# Patient Record
Sex: Female | Born: 1970
Health system: Southern US, Community
[De-identification: ages and names within clinical notes are randomized; demographics above are authoritative.]

## PROBLEM LIST (undated history)

## (undated) DIAGNOSIS — E119 Type 2 diabetes mellitus without complications: Secondary | ICD-10-CM

## (undated) DIAGNOSIS — E079 Disorder of thyroid, unspecified: Secondary | ICD-10-CM

## (undated) DIAGNOSIS — N920 Excessive and frequent menstruation with regular cycle: Secondary | ICD-10-CM

## (undated) HISTORY — DX: Excessive and frequent menstruation with regular cycle: N92.0

## (undated) HISTORY — PX: WISDOM TOOTH EXTRACTION: SHX21

## (undated) HISTORY — DX: Disorder of thyroid, unspecified: E07.9

---

## 2011-11-24 ENCOUNTER — Other Ambulatory Visit: Payer: Self-pay | Admitting: Family Medicine

## 2011-11-24 DIAGNOSIS — N644 Mastodynia: Secondary | ICD-10-CM

## 2011-12-01 ENCOUNTER — Ambulatory Visit
Admission: RE | Admit: 2011-12-01 | Discharge: 2011-12-01 | Disposition: A | Discharge status: Home / Self Care | Payer: Self-pay | Source: Ambulatory Visit | Attending: Family Medicine | Admitting: Family Medicine

## 2011-12-01 ENCOUNTER — Other Ambulatory Visit: Payer: Self-pay | Admitting: Family Medicine

## 2011-12-01 DIAGNOSIS — N644 Mastodynia: Secondary | ICD-10-CM

## 2012-12-19 ENCOUNTER — Emergency Department (HOSPITAL_COMMUNITY)
Admission: EM | Admit: 2012-12-19 | Discharge: 2012-12-19 | Disposition: A | Discharge status: Home / Self Care | Payer: No Typology Code available for payment source | Source: Home / Self Care

## 2012-12-19 ENCOUNTER — Encounter (HOSPITAL_COMMUNITY): Payer: Self-pay

## 2012-12-19 DIAGNOSIS — N10 Acute tubulo-interstitial nephritis: Secondary | ICD-10-CM

## 2012-12-19 MED ORDER — CIPROFLOXACIN HCL 500 MG PO TABS: 500.0000 mg | ORAL_TABLET | Freq: Two times a day (BID) | ORAL | Status: DC

## 2012-12-19 MED ORDER — TRAMADOL HCL 50 MG PO TABS: 50.0000 mg | ORAL_TABLET | Freq: Four times a day (QID) | ORAL | Status: DC | PRN

## 2012-12-19 NOTE — ED Notes (Signed)
Patient complains of lower right back pain past two days Has burning when she voids

## 2012-12-19 NOTE — ED Provider Notes (Signed)
History     CSN: [id number]  Arrival date & time 12/19/12  1716   First MD Initiated Contact with Patient 12/19/12 1730      Right flank pain  (Consider location/radiation/quality/duration/timing/severity/associated sxs/prior treatment) HPI Patient is 42 year old female who presents to clinic with main concern of today history of lower abdominal quadrant pain associated with right flank pain, associated with fevers and chills and poor oral intake. Patient reports similar event in the past several years back that was due to kidney infection. Patient describes pain is colicky, intermittent in nature, nonradiating, 7/10 in severity when present. Patient explains she does not have other abdominal concerns, no nausea or vomiting, no changes in weight. Patient denies chest pain or shortness of breath, no other systemic concerns. She has not tried any over-the-counter medications. Patient is unaware any specific alleviating or aggravating factors.  No past medical history   No past surgical history   No family history of medical conditions.  History  Substance Use Topics  . Smoking status: Not on file  . Smokeless tobacco: Not on file  . Alcohol Use: Not on file    OB History   No data available      Review of Systems Review of Systems  Constitutional: Positive for fever, chills, negative for diaphoresis, activity change, appetite change and fatigue.  HENT: Negative for ear pain, nosebleeds, congestion, facial swelling, rhinorrhea, neck pain, neck stiffness and ear discharge.   Eyes: Negative for pain, discharge, redness, itching and visual disturbance.  Respiratory: Negative for cough, choking, chest tightness, shortness of breath, wheezing and stridor.   Cardiovascular: Negative for chest pain, palpitations and leg swelling.  Gastrointestinal: AS per HPI.  Genitourinary: Negative for dysuria, urgency, frequency, hematuria, flank pain, decreased urine volume, difficulty urinating  and dyspareunia.  Musculoskeletal: Negative for back pain, joint swelling, arthralgias and gait problem.  Neurological: Negative for dizziness, tremors, seizures, syncope, facial asymmetry, speech difficulty, weakness, light-headedness, numbness and headaches.  Hematological: Negative for adenopathy. Does not bruise/bleed easily.  Psychiatric/Behavioral: Negative for hallucinations, behavioral problems, confusion, dysphoric mood, decreased concentration and agitation.    Allergies  NKDA  Home Medications  No current outpatient prescriptions on file.  Blood pressure 124/66, temperature 98.2, heart rate 66/min  Physical Exam Physical Exam  Constitutional: Appears well-developed and well-nourished. No distress.  HENT: Normocephalic. External right and left ear normal. Oropharynx is clear and moist.  Eyes: Conjunctivae and EOM are normal. PERRLA, no scleral icterus.  Neck: Normal ROM. Neck supple. No JVD. No tracheal deviation. No thyromegaly.  CVS: RRR, S1/S2 +, no murmurs, no gallops, no carotid bruit.  Pulmonary: Effort and breath sounds normal, no stridor, rhonchi, wheezes, rales.  Abdominal: Soft. BS +,  no distension, right flank area tenderness, tenderness in lower abdominal quadrants with no guarding or rebound tenderness  Musculoskeletal: Normal range of motion. No edema and no tenderness.  Lymphadenopathy: No lymphadenopathy noted, cervical, inguinal. Neuro: Alert. Normal reflexes, muscle tone coordination. No cranial nerve deficit. Skin: Skin is warm and dry. No rash noted. Not diaphoretic. No erythema. No pallor.  Psychiatric: Normal mood and affect. Behavior, judgment, thought content normal.    ED Course  Procedures (including critical care time)  Labs Reviewed - No data to display No results found.  Right flank area pain - Clinical exam findings and symptoms most consistent with pyelonephritis - Will treat patient with ciprofloxacin for 10 days - Patient advised to  drink plenty of fluids and to take antibiotic as prescribed - Patient  advised to come back and see [place] if her symptoms do not improve or get worse   MDM  Pyelonephritis, 10 day course of ciprofloxacin prescribed        Alison Murray, MD 12/19/12 1753

## 2014-02-10 ENCOUNTER — Ambulatory Visit: Payer: No Typology Code available for payment source | Attending: Internal Medicine | Admitting: Internal Medicine

## 2014-02-10 ENCOUNTER — Encounter: Payer: Self-pay | Admitting: Internal Medicine

## 2014-02-10 VITALS — BP 121/78 | HR 58 | Temp 97.9°F | Resp 16 | Ht 63.0 in | Wt 175.0 lb

## 2014-02-10 DIAGNOSIS — R51 Headache: Secondary | ICD-10-CM

## 2014-02-10 DIAGNOSIS — N920 Excessive and frequent menstruation with regular cycle: Secondary | ICD-10-CM

## 2014-02-10 DIAGNOSIS — Z299 Encounter for prophylactic measures, unspecified: Secondary | ICD-10-CM

## 2014-02-10 DIAGNOSIS — Z0000 Encounter for general adult medical examination without abnormal findings: Secondary | ICD-10-CM

## 2014-02-10 LAB — POCT GLYCOSYLATED HEMOGLOBIN (HGB A1C): Hemoglobin A1C: 5.1

## 2014-02-10 NOTE — Progress Notes (Unsigned)
Patient ID: Linda Delgado, female   DOB: [birth year], 43 y.o.   MRN: [id number]   HPI: Linda Delgado is a 43 y.o. female presenting on 02/10/2014 to establish care. She had a headache and dizziness this weekend and checked her blood sugar at her friend's home and found out it was in the 180s. She wants to be sure she does not have DM.     PMH: none   past surgical history: C section x 3     No Known Allergies  Family History  Problem Relation Age of Onset  . Diabetes Family     History   Social History  . Marital Status: Married    Spouse Name: N/A    Number of Children: N/A  . Years of Education: N/A   Occupational History  . Psychiatric nurseactory worker   Social History Main Topics  . Smoking status: Never Smoker   . Smokeless tobacco: Not on file  . Alcohol Use: No  . Drug Use: No  . Sexual Activity: Not on file   Other Topics Concern  . Not on file   Social History Narrative  . No narrative on file    Review of Systems  Review of Systems  Constitutional: Negative for fever, chills, diaphoresis, activity change, appetite change and fatigue. Has lost about 4 lbs.  HENT: Negative for ear pain, nosebleeds, congestion, facial swelling, rhinorrhea, neck pain, neck stiffness and ear discharge.  Eyes: Negative for pain, discharge, redness, itching and visual disturbance. Blurred vision Respiratory: Negative for cough, choking, chest tightness, shortness of breath, wheezing and stridor.  Cardiovascular: Negative for chest pain, palpitations and leg swelling.  Gastrointestinal: Negative for abdominal distention, vomiting, diarrhea or consitpation,  Genitourinary: Negative for dysuria, urgency, frequency, hematuria, flank pain, decreased urine volume, difficulty urinating  Musculoskeletal: Negative for back pain, joint swelling, arthralgias or gait problem.  Neurological: + for headaches and dizziness, negative tremors, seizures, syncope, facial asymmetry,  speech difficulty, weakness, light-headedness, numbness and headaches.  Hematological: Negative for adenopathy. Does occassionally bruise/bleed easily.  Psychiatric/Behavioral: Negative for hallucinations, behavioral problems, confusion, dysphoric mood Gyn: c/o heavy periods sometimes twice a month   Objective:  BP 121/78  Pulse 58  Temp(Src) 97.9 F (36.6 C) (Oral)  Resp 16  Ht 5\' 3"  (1.6 m)  Wt 175 lb (79.379 kg)  BMI 31.01 kg/m2  SpO2 100%  LMP 02/03/2014 Filed Weights   02/10/14 1211  Weight: 175 lb (79.379 kg)     Physical Exam  Constitutional: Appears well-developed and well-nourished. No distress. HENT: Normocephalic. External right and left ear normal. Oropharynx is clear and moist.  Eyes: Conjunctivae and EOM are normal. PERRLA, no scleral icterus.  Neck: Normal ROM. Neck supple. No JVD. No tracheal deviation. No thyromegaly.  CVS: RRR, S1/S2 +, no murmurs, no gallops, no carotid bruit.  Pulmonary: Effort and breath sounds normal, no stridor, rhonchi, wheezes, rales.  Abdominal: Soft. BS +,  no distension, tenderness, rebound or guarding.  Musculoskeletal: Normal range of motion. No edema and no tenderness.  Neuro: Alert. Normal reflexes, muscle tone coordination. No cranial nerve deficit. Skin: Skin is warm and dry. No rash noted. Not diaphoretic. No erythema. No pallor.  Psychiatric: Normal mood and affect. Behavior, judgment, thought content normal.   No results found for this basename: WBC,  HGB,  HCT,  MCV,  PLT   No results found for this basename: CREATININE,  BUN,  NA,  K,  CL,  CO2    Lab Results  Component Value  Date   HGBA1C 5.1 02/10/2014   Lipid Panel  No results found for this basename: chol,  trig,  hdl,  cholhdl,  vldl,  ldlcalc        There are no active problems to display for this patient.    Preventative Medicine:  There are no preventive care reminders to display for this patient.  There are no preventive care reminders to display  for this patient. Mammogram/Pap Smear : ordered  LAB WORK: following has been ordered Metabolic panel: CBC:  Vitamin D : Lipid Panel: TSH: PSA:    Assessment and plan: Preventive measure - Plan: HgB A1c, Lipid panel, CBC, Basic metabolic panel, TSH, Vitamin D, 25-hydroxy, Ambulatory referral to Gynecology, [facility]  Routine history and physical examination of adult - Plan: Ambulatory referral to Gynecology, [facility]  Menorrhagia - referral to GYN  Headaches - PRN tylenol or Motrin   Return if symptoms worsen or fail to improve.   The patient was given clear instructions to go to ER or return to medical center if symptoms don't improve, worsen or new problems develop. The patient verbalized understanding. The patient was told to call to get lab results if they haven't heard anything in the next week.     Calvert CantorSaima Paulyne Mooty, MD

## 2014-02-10 NOTE — Progress Notes (Unsigned)
Pt here to establish care for frequent frontal headaches radiating to left face x 1 week C/o left breast pain radiating to left shoulder intermit x 3 mnths Pain stated 3 dys ago at a family cookout, she didn't feel well. 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

## 2014-02-18 ENCOUNTER — Encounter: Payer: Self-pay | Admitting: Obstetrics & Gynecology

## 2014-02-24 ENCOUNTER — Ambulatory Visit: Payer: No Typology Code available for payment source | Attending: Internal Medicine

## 2014-02-24 DIAGNOSIS — Z299 Encounter for prophylactic measures, unspecified: Secondary | ICD-10-CM

## 2014-02-24 LAB — BASIC METABOLIC PANEL
BUN: 10 mg/dL (ref 6–23)
CO2: 25 mEq/L (ref 19–32)
Calcium: 9.2 mg/dL (ref 8.4–10.5)
Chloride: 104 mEq/L (ref 96–112)
Creat: 0.76 mg/dL (ref 0.50–1.10)
Glucose, Bld: 107 mg/dL — ABNORMAL HIGH (ref 70–99)
Potassium: 4.2 mEq/L (ref 3.5–5.3)
Sodium: 139 mEq/L (ref 135–145)

## 2014-02-24 LAB — CBC
HCT: 34.1 % — ABNORMAL LOW (ref 36.0–46.0)
Hemoglobin: 11.5 g/dL — ABNORMAL LOW (ref 12.0–15.0)
MCH: 28.5 pg (ref 26.0–34.0)
MCHC: 33.7 g/dL (ref 30.0–36.0)
MCV: 84.4 fL (ref 78.0–100.0)
Platelets: 257 10*3/uL (ref 150–400)
RBC: 4.04 MIL/uL (ref 3.87–5.11)
RDW: 13.2 % (ref 11.5–15.5)
WBC: 6.1 10*3/uL (ref 4.0–10.5)

## 2014-02-24 LAB — LIPID PANEL
Cholesterol: 180 mg/dL (ref 0–200)
HDL: 47 mg/dL (ref >39)
LDL Cholesterol: 100 mg/dL — ABNORMAL HIGH (ref 0–99)
Total CHOL/HDL Ratio: 3.8 Ratio
Triglycerides: 164 mg/dL — ABNORMAL HIGH (ref <150)
VLDL: 33 mg/dL (ref 0–40)

## 2014-02-24 LAB — TSH: TSH: 5.223 u[IU]/mL — ABNORMAL HIGH (ref 0.350–4.500)

## 2014-02-25 LAB — VITAMIN D 25 HYDROXY (VIT D DEFICIENCY, FRACTURES): Vit D, 25-Hydroxy: 39 ng/mL (ref 30–89)

## 2014-03-24 ENCOUNTER — Telehealth: Payer: Self-pay | Admitting: *Deleted

## 2014-03-24 NOTE — Telephone Encounter (Signed)
Message copied by Raynelle CharyWINFREE, Nomar Broad R on Mon Mar 24, 2014  2:03 PM ------      Message from: Calvert CantorIZWAN, SAIMA      Created: Sat Mar 22, 2014  3:50 PM       Please ask patient to return as soon as possible for TSH and free T4. ------

## 2014-03-24 NOTE — Telephone Encounter (Signed)
I spoke to pt's son and he told her to come in tomorrow for labs.

## 2014-03-25 ENCOUNTER — Ambulatory Visit: Payer: No Typology Code available for payment source | Attending: Internal Medicine

## 2014-03-25 DIAGNOSIS — R7989 Other specified abnormal findings of blood chemistry: Secondary | ICD-10-CM

## 2014-03-26 LAB — TSH: TSH: 3.644 u[IU]/mL (ref 0.350–4.500)

## 2014-03-26 LAB — T4, FREE: Free T4: 1.01 ng/dL (ref 0.80–1.80)

## 2014-04-02 ENCOUNTER — Encounter: Payer: Self-pay | Admitting: Obstetrics & Gynecology

## 2014-04-02 ENCOUNTER — Ambulatory Visit (INDEPENDENT_AMBULATORY_CARE_PROVIDER_SITE_OTHER): Payer: No Typology Code available for payment source | Admitting: Obstetrics & Gynecology

## 2014-04-02 VITALS — BP 118/74 | HR 82 | Temp 97.0°F | Resp 20 | Wt 173.7 lb

## 2014-04-02 DIAGNOSIS — N920 Excessive and frequent menstruation with regular cycle: Secondary | ICD-10-CM | POA: Insufficient documentation

## 2014-04-02 MED ORDER — IBUPROFEN 600 MG PO TABS: 600.0000 mg | ORAL_TABLET | Freq: Four times a day (QID) | ORAL | Status: DC | PRN

## 2014-04-02 NOTE — Progress Notes (Signed)
Patient ID: Linda Delgado, female   DOB: [birth year], 43 y.o.   MRN: [id number]  Chief Complaint  Patient presents with  . Referral    [facility] - [facility]    HPI Linda Delgado is a 43 y.o. female.  [id number] Patient's last menstrual period was 03/26/2014. Menses "twice a month" and are heavy with clots and pain, takes ibuprofen on occasion with some relief. No BCM for 8 years after last delivery by cesarean in [place].  HPI  Past Medical History  Diagnosis Date  . Menorrhagia     Past Surgical History  Procedure Laterality Date  . Cesarean section  1997, 1999, 2007  . Wisdom tooth extraction      Family History  Problem Relation Age of Onset  . Diabetes Father     Social History History  Substance Use Topics  . Smoking status: Never Smoker   . Smokeless tobacco: Not on file  . Alcohol Use: No    No Known Allergies  Current Outpatient Prescriptions  Medication Sig Dispense Refill  . ibuprofen (ADVIL,MOTRIN) 600 MG tablet Take 1 tablet (600 mg total) by mouth every 6 (six) hours as needed.  30 tablet  1   No current facility-administered medications for this visit.    Review of Systems Review of Systems  Constitutional: Negative.   Genitourinary: Positive for menstrual problem. Negative for vaginal bleeding, vaginal discharge, pelvic pain and dyspareunia.    Blood pressure 118/74, pulse 82, temperature 97 F (36.1 C), temperature source Oral, resp. rate 20, weight 173 lb 11.2 oz (78.79 kg), last menstrual period 03/26/2014.  Physical Exam Physical Exam  Constitutional: She is oriented to person, place, and time. She appears well-developed. No distress.  Pulmonary/Chest: Effort normal. No respiratory distress.  Abdominal: Soft. There is no tenderness.  Genitourinary: Vagina normal and uterus normal. No vaginal discharge found.  Clear cervical mucus, no mass  Neurological: She is alert and oriented to person, place, and time.   Skin: Skin is warm and dry. No pallor.  Psychiatric: She has a normal mood and affect. Her behavior is normal.    Data Reviewed Office note CBC    Component Value Date/Time   WBC 6.1 02/24/2014 0900   RBC 4.04 02/24/2014 0900   HGB 11.5* 02/24/2014 0900   HCT 34.1* 02/24/2014 0900   PLT 257 02/24/2014 0900   MCV 84.4 02/24/2014 0900   MCH 28.5 02/24/2014 0900   MCHC 33.7 02/24/2014 0900   RDW 13.2 02/24/2014 0900      Assessment    Menorrhagia, perimenopause     Plan    Ibuprofen 600 mg TID on menses and RTC 3 month with menstrual calendar          ARNOLD,JAMES 04/02/2014, 2:55 PM

## 2014-04-02 NOTE — Patient Instructions (Signed)
Menstruación °( Menstruation) °La menstruación es la eliminación mensual de sangre, tejidos, líquidos y mucosidad, también conocida como período. El organismo elimina el revestimiento del útero. El flujo, o la cantidad de sangre, generalmente dura entre 3 y 7 días cada mes. Las hormonas son las que controlan el ciclo menstrual. Las hormonas son sustancias químicas generadas por las glándulas endocrinas para regular las distintas funciones del organismo. °El primer período menstrual puede comenzar entre los 8 y los 16 años. Sin embargo, generalmente comienza alrededor de los 12 años. Algunas niñas tienen períodos menstruales regulares desde el comienzo. No obstante, no es inusual eliminar solo unas cuantas gotas de sangre o tener un manchado menstrual cuando recién se comienza a menstruar. Tampoco es inusual tener dos períodos al mes o saltearse uno o dos cuando estos recién comienzan. °SÍNTOMAS  °· Cólicos abdominales leves a moderados. °· Dolor en la parte inferior de la espalda. °Los síntomas se pueden presentar entre 5 a 10 días antes de que comience el período menstrual. Estos síntomas se conocen como síndrome premenstrual (SPM) y pueden incluir los siguientes: °· Dolor de cabeza. °· Sensibilidad e hinchazón en las mamas. °· Hinchazón. °· Cansancio (fatiga). °· Cambios en el humor. °· Ansiedad por consumir ciertos alimentos. °Estos son signos y síntomas normales y pueden variar en intensidad. Para ayudar a aliviar estos problemas, pregúntele al médico si puede tomar medicamentos de venta libre para el dolor o los malestares. Si los síntomas no se pueden controlar, consulte con el médico.  °HORMONAS QUE INTERVIENEN EN LA MENSTRUACIÓN °La menstruación ocurre debido a las hormonas producidas por la hipófisis en el cerebro y los ovarios que afectan al revestimiento del útero. °Primero, la hipófisis en el cerebro produce la hormona folículoestimulante (FSH, por sus siglas en inglés). La FSH estimula a los ovarios  para que produzcan estrógeno, el cual engrosa el revestimiento del útero y comienza a desarrollar un óvulo en el ovario. Aproximadamente 14 días después, la hipófisis produce otra hormona llamada hormona luteinizante (LH, por sus siglas en inglés). La LH hace que el óvulo salga de la cavidad en el ovario (ovulación). La prolactina, otra hormona de la hipófisis, estimula la cavidad vacía en el ovario, llamada cuerpo lúteo. El cuerpo lúteo comienza a producir dos hormonas, el estrógeno y la progesterona. La progesterona prepara al revestimiento del útero para recibir el óvulo fecundado (óvulo combinado con espermatozoide) y para que este se adhiera al revestimiento del útero y comience a desarrollarse en un feto. Si el óvulo no fue fecundado, el cuerpo lúteo deja de producir estrógeno y progesterona, desaparece, el revestimiento del útero se desintegra y comienza el período menstrual. Luego comienza el ciclo menstrual nuevamente y continuará todos los meses, a menos que ocurra un embarazo o comience la menopausia. °La secreción de hormonas es un proceso complejo. Varias partes del organismo están involucradas en muchas actividades químicas. Las hormonas sexuales femeninas también cumplen otras funciones en el organismo de la mujer. El estrógeno aumenta el deseo sexual de la mujer (libido). Es un diurético natural ya que ayuda al organismo a deshacerse de los líquidos. También interviene en el proceso de formación los huesos. Por lo tanto, mantener la salud hormonal es fundamental para todos los niveles del bienestar de la mujer. Estas hormonas generalmente se encuentran en cantidades normales y son las responsables de la menstruación. Lo crítico es la relación entre los niveles (reducidos) de estas hormonas. Cuando el equilibrio se altera, se producen irregularidades menstruales. °¿Cómo se produce el ciclo menstrual? °· Los ciclos menstruales varían en   duracin de 21 a 35das, con un promedio de 29das. El ciclo  comienza Film/video editorel primer da en que se produce el sangrado. En este momento, la hipfisis en el cerebro libera FSH, que viaja a travs del torrente Yahoo! Incsanguneo hacia los ovarios. La FSH estimula los folculos en los ovarios. Esto prepara al organismo para la ovulacin que ocurre aproximadamente el da14 del ciclo. Los ovarios liberan estrgenos y esto garantiza que las condiciones en el tero sean las adecuadas para la implantacin del vulo fecundado.  Cuando los niveles de estrgenos alcanzan un nivel suficientemente elevado, le envan una seal a la glndula en el cerebro (hipfisis) para que libere una cantidad determinada de LH. Esto provoca la liberacin del vulo maduro del folculo (ovulacin). Generalmente, un folculo Croatialibera solo un vulo, pero a veces libera ms de un vulo, especialmente cuando se estimulan los ovarios para la fertilizacin in vitro. Luego, el vulo puede instalarse en la trompa de Falopio y Programmer, systemsesperar la fertilizacin. El folculo que estall dentro del ovario, y que [place] atrs, ahora se denomina cuerpo lteo o "cuerpo amarillo". El cuerpo lteo sigue liberando (segregando) cantidades reducidas de estrgeno. Esto cierra y endurece el cuello del tero. Seca la mucosidad y la lleva a un estado natural de infertilidad.  El cuerpo lteo tambin comienza a Museum/gallery conservatorliberar cantidades ms grandes de Education officer, museumprogesterona. Esto hace que el revestimiento del tero (endometrio) se vuelva an ms grueso para prepararse para el vulo fecundado. El vulo comienza su trayecto Germaniahacia abajo, desde las trompas de Exelon CorporationFalopio hacia el tero. Y le enva a los ovarios la seal para que no liberen ms vulos. Interviene en el regreso del mucus cervical a su estado de infertilidad.  Si el vulo se implanta exitosamente en el tejido que recubre al tero y se produce el Gillespieembarazo, los niveles de progesterona continuarn Morgantownaumentando. Generalmente, esta es la hormona que les brinda a algunas mujeres embarazadas una sensacin de [place]bienestar,  parecida a una "euforia natural". Los niveles de progesterona vuelven a Software engineerdescender despus del parto  Si no hay fecundacin, el cuerpo lteo muere y deja de producir hormonas. Esta disminucin repentina de progesterona provoca la desintegracin del revestimiento del tero acompaada de sangre (Bethel Islandmenstruacin).  Esto reinicia Recruitment consultantel ciclo en el da1 y todo el proceso comienza nuevamente. Las mujeres atraviesan este ciclo todos los meses, desde la pubertad a la menopausia. El ciclo solo se interrumpe Academic librariandurante el embarazo y Mining engineerel amamantamiento (Market researcherlactancia), a menos que la mujer tenga problemas de salud que afecten al sistema hormonal femenino o elija tomar anticonceptivos orales para tener perodos menstruales no naturales. INSTRUCCIONES PARA EL CUIDADO EN EL HOGAR   Use un calendario para llevar un registro de sus perodos.  Si Bots[place]ausa tampones, compre los menos absorbentes para evitar el sndrome del choque txico.  No deje los tampones en la vagina toda la noche ni por un perodo mayor a 6horas.  Durante la noche use una toalla higinica.  Haga ejercicios de 3 a 5 veces por semana o ms.  Evite los alimentos y las bebidas que sabe que empeorarn sus sntomas antes o durante el perodo. SOLICITE ATENCIN MDICA SI:   Tiene fiebre con el perodo.  Los perodos duran ms de 7das.  El perodo es tan abundante que debe cambiarse las toallas higinicas o los tampones cada 30minutos.  Presenta cogulos con el perodo y nunca antes los Ferdinandtuvo.  No logra aliviar los sntomas con medicamentos de [place]venta libre.  Su perodo no ha comenzado y ya han pasado ms  de 35das. Document Released: 07/06/2005 Document Revised: 10/01/2013 [facility]ExitCare Patient Information 2015 [place]ExitCare, MarylandLLC. This information is not intended to replace advice given to you by your health care Nivek Powley. Make sure you discuss any questions you have with your health care Kayler Buckholtz.

## 2014-08-11 ENCOUNTER — Encounter: Payer: Self-pay | Admitting: Obstetrics & Gynecology

## 2014-08-20 ENCOUNTER — Ambulatory Visit: Payer: Self-pay | Attending: Internal Medicine

## 2014-11-05 ENCOUNTER — Other Ambulatory Visit: Payer: Self-pay

## 2014-11-05 DIAGNOSIS — N644 Mastodynia: Secondary | ICD-10-CM

## 2014-11-13 ENCOUNTER — Emergency Department (HOSPITAL_COMMUNITY): Payer: Self-pay

## 2014-11-13 ENCOUNTER — Ambulatory Visit: Payer: Self-pay | Attending: Internal Medicine | Admitting: Internal Medicine

## 2014-11-13 ENCOUNTER — Encounter (HOSPITAL_COMMUNITY): Payer: Self-pay

## 2014-11-13 ENCOUNTER — Encounter: Payer: Self-pay | Admitting: Internal Medicine

## 2014-11-13 ENCOUNTER — Emergency Department (HOSPITAL_COMMUNITY)
Admission: EM | Admit: 2014-11-13 | Discharge: 2014-11-13 | Disposition: A | Discharge status: Home / Self Care | Payer: Self-pay | Attending: Emergency Medicine | Admitting: Emergency Medicine

## 2014-11-13 ENCOUNTER — Other Ambulatory Visit: Payer: Self-pay

## 2014-11-13 VITALS — BP 133/84 | HR 66 | Temp 98.8°F | Resp 16 | Wt 179.4 lb

## 2014-11-13 DIAGNOSIS — D509 Iron deficiency anemia, unspecified: Secondary | ICD-10-CM | POA: Insufficient documentation

## 2014-11-13 DIAGNOSIS — R0781 Pleurodynia: Secondary | ICD-10-CM | POA: Insufficient documentation

## 2014-11-13 DIAGNOSIS — Z23 Encounter for immunization: Secondary | ICD-10-CM | POA: Insufficient documentation

## 2014-11-13 DIAGNOSIS — Z9889 Other specified postprocedural states: Secondary | ICD-10-CM | POA: Insufficient documentation

## 2014-11-13 DIAGNOSIS — N920 Excessive and frequent menstruation with regular cycle: Secondary | ICD-10-CM | POA: Insufficient documentation

## 2014-11-13 DIAGNOSIS — E039 Hypothyroidism, unspecified: Secondary | ICD-10-CM | POA: Insufficient documentation

## 2014-11-13 DIAGNOSIS — M25512 Pain in left shoulder: Secondary | ICD-10-CM | POA: Insufficient documentation

## 2014-11-13 DIAGNOSIS — Z139 Encounter for screening, unspecified: Secondary | ICD-10-CM

## 2014-11-13 DIAGNOSIS — R1012 Left upper quadrant pain: Secondary | ICD-10-CM | POA: Insufficient documentation

## 2014-11-13 DIAGNOSIS — N644 Mastodynia: Secondary | ICD-10-CM | POA: Insufficient documentation

## 2014-11-13 DIAGNOSIS — Z79899 Other long term (current) drug therapy: Secondary | ICD-10-CM | POA: Insufficient documentation

## 2014-11-13 DIAGNOSIS — Z3202 Encounter for pregnancy test, result negative: Secondary | ICD-10-CM | POA: Insufficient documentation

## 2014-11-13 DIAGNOSIS — Z1231 Encounter for screening mammogram for malignant neoplasm of breast: Secondary | ICD-10-CM

## 2014-11-13 DIAGNOSIS — R110 Nausea: Secondary | ICD-10-CM | POA: Insufficient documentation

## 2014-11-13 LAB — URINALYSIS, ROUTINE W REFLEX MICROSCOPIC
Bilirubin Urine: NEGATIVE
Glucose, UA: NEGATIVE mg/dL
Ketones, ur: NEGATIVE mg/dL
Leukocytes, UA: NEGATIVE
Nitrite: NEGATIVE
Protein, ur: NEGATIVE mg/dL
Specific Gravity, Urine: 1.008 (ref 1.005–1.030)
Urobilinogen, UA: 0.2 mg/dL (ref 0.0–1.0)
pH: 6 (ref 5.0–8.0)

## 2014-11-13 LAB — COMPLETE METABOLIC PANEL WITH GFR
ALT: 78 U/L — ABNORMAL HIGH (ref 0–35)
AST: 45 U/L — ABNORMAL HIGH (ref 0–37)
Albumin: 4.3 g/dL (ref 3.5–5.2)
Alkaline Phosphatase: 66 U/L (ref 39–117)
BUN: 11 mg/dL (ref 6–23)
CO2: 25 mEq/L (ref 19–32)
Calcium: 9.3 mg/dL (ref 8.4–10.5)
Chloride: 103 mEq/L (ref 96–112)
Creat: 0.75 mg/dL (ref 0.50–1.10)
GFR, Est African American: >89 mL/min
GFR, Est Non African American: >89 mL/min
Glucose, Bld: 106 mg/dL — ABNORMAL HIGH (ref 70–99)
Potassium: 4 mEq/L (ref 3.5–5.3)
Sodium: 138 mEq/L (ref 135–145)
Total Bilirubin: 0.4 mg/dL (ref 0.2–1.2)
Total Protein: 7.4 g/dL (ref 6.0–8.3)

## 2014-11-13 LAB — CBC WITH DIFFERENTIAL/PLATELET
Basophils Absolute: 0 10*3/uL (ref 0.0–0.1)
Basophils Absolute: 0 10*3/uL (ref 0.0–0.1)
Basophils Relative: 0 % (ref 0–1)
Basophils Relative: 0 % (ref 0–1)
Eosinophils Absolute: 0.2 10*3/uL (ref 0.0–0.7)
Eosinophils Absolute: 0.2 10*3/uL (ref 0.0–0.7)
Eosinophils Relative: 3 % (ref 0–5)
Eosinophils Relative: 3 % (ref 0–5)
HCT: 35.6 % — ABNORMAL LOW (ref 36.0–46.0)
HCT: 35.6 % — ABNORMAL LOW (ref 36.0–46.0)
Hemoglobin: 11.9 g/dL — ABNORMAL LOW (ref 12.0–15.0)
Hemoglobin: 12.1 g/dL (ref 12.0–15.0)
Lymphocytes Relative: 46 % (ref 12–46)
Lymphocytes Relative: 42 % (ref 12–46)
Lymphs Abs: 3 10*3/uL (ref 0.7–4.0)
Lymphs Abs: 2.5 10*3/uL (ref 0.7–4.0)
MCH: 28.8 pg (ref 26.0–34.0)
MCH: 28.6 pg (ref 26.0–34.0)
MCHC: 33.4 g/dL (ref 30.0–36.0)
MCHC: 34 g/dL (ref 30.0–36.0)
MCV: 86.2 fL (ref 78.0–100.0)
MCV: 84.2 fL (ref 78.0–100.0)
MPV: 11.1 fL (ref 8.6–12.4)
Monocytes Absolute: 0.5 10*3/uL (ref 0.1–1.0)
Monocytes Absolute: 0.4 10*3/uL (ref 0.1–1.0)
Monocytes Relative: 7 % (ref 3–12)
Monocytes Relative: 7 % (ref 3–12)
Neutro Abs: 2.9 10*3/uL (ref 1.7–7.7)
Neutro Abs: 2.9 10*3/uL (ref 1.7–7.7)
Neutrophils Relative %: 44 % (ref 43–77)
Neutrophils Relative %: 48 % (ref 43–77)
Platelets: 285 10*3/uL (ref 150–400)
Platelets: 271 10*3/uL (ref 150–400)
RBC: 4.13 MIL/uL (ref 3.87–5.11)
RBC: 4.23 MIL/uL (ref 3.87–5.11)
RDW: 16.4 % — ABNORMAL HIGH (ref 11.5–15.5)
RDW: 14.4 % (ref 11.5–15.5)
WBC: 6.6 10*3/uL (ref 4.0–10.5)
WBC: 6 10*3/uL (ref 4.0–10.5)

## 2014-11-13 LAB — TSH: TSH: 4.659 u[IU]/mL — ABNORMAL HIGH (ref 0.350–4.500)

## 2014-11-13 LAB — COMPREHENSIVE METABOLIC PANEL
ALT: 88 U/L — ABNORMAL HIGH (ref 0–35)
AST: 55 U/L — ABNORMAL HIGH (ref 0–37)
Albumin: 4.4 g/dL (ref 3.5–5.2)
Alkaline Phosphatase: 70 U/L (ref 39–117)
Anion gap: 8 (ref 5–15)
BUN: 10 mg/dL (ref 6–23)
CO2: 27 mmol/L (ref 19–32)
Calcium: 9.2 mg/dL (ref 8.4–10.5)
Chloride: 103 mmol/L (ref 96–112)
Creatinine, Ser: 0.75 mg/dL (ref 0.50–1.10)
GFR calc Af Amer: >90 mL/min (ref >90)
GFR calc non Af Amer: >90 mL/min (ref >90)
Glucose, Bld: 118 mg/dL — ABNORMAL HIGH (ref 70–99)
Potassium: 3.8 mmol/L (ref 3.5–5.1)
Sodium: 138 mmol/L (ref 135–145)
Total Bilirubin: 0.6 mg/dL (ref 0.3–1.2)
Total Protein: 7.8 g/dL (ref 6.0–8.3)

## 2014-11-13 LAB — POC URINE PREG, ED: Preg Test, Ur: NEGATIVE

## 2014-11-13 LAB — I-STAT TROPONIN, ED: Troponin i, poc: 0 ng/mL (ref 0.00–0.08)

## 2014-11-13 LAB — URINE MICROSCOPIC-ADD ON

## 2014-11-13 LAB — HEMOGLOBIN A1C
Hgb A1c MFr Bld: 5.5 % (ref <5.7)
Mean Plasma Glucose: 111 mg/dL (ref <117)

## 2014-11-13 LAB — LIPASE, BLOOD: Lipase: 33 U/L (ref 11–59)

## 2014-11-13 MED ORDER — ONDANSETRON HCL 4 MG/2ML IJ SOLN
4.0000 mg | Freq: Once | INTRAMUSCULAR | Status: DC
  Filled 2014-11-13: qty 2

## 2014-11-13 MED ORDER — ONDANSETRON 4 MG PO TBDP
8.0000 mg | ORAL_TABLET | Freq: Once | ORAL | Status: AC
  Administered 2014-11-13: 8 mg via ORAL
  Filled 2014-11-13: qty 2

## 2014-11-13 MED ORDER — IOHEXOL 300 MG/ML  SOLN
80.0000 mL | Freq: Once | INTRAMUSCULAR | Status: AC | PRN
  Administered 2014-11-13: 80 mL via INTRAVENOUS

## 2014-11-13 MED ORDER — HYDROMORPHONE HCL 1 MG/ML IJ SOLN
1.0000 mg | Freq: Once | INTRAMUSCULAR | Status: AC
  Administered 2014-11-13: 1 mg via INTRAVENOUS
  Filled 2014-11-13: qty 1

## 2014-11-13 MED ORDER — IOHEXOL 300 MG/ML  SOLN
25.0000 mL | Freq: Once | INTRAMUSCULAR | Status: AC | PRN
  Administered 2014-11-13: 25 mL via ORAL

## 2014-11-13 MED ORDER — HYDROCODONE-ACETAMINOPHEN 5-325 MG PO TABS: 1.0000 | ORAL_TABLET | Freq: Four times a day (QID) | ORAL | Status: DC | PRN

## 2014-11-13 MED ORDER — PANTOPRAZOLE SODIUM 20 MG PO TBEC: 20.0000 mg | DELAYED_RELEASE_TABLET | Freq: Every day | ORAL | Status: DC

## 2014-11-13 MED ORDER — GI COCKTAIL ~~LOC~~
30.0000 mL | Freq: Once | ORAL | Status: AC
  Administered 2014-11-13: 30 mL via ORAL
  Filled 2014-11-13: qty 30

## 2014-11-13 NOTE — Discharge Instructions (Signed)
Dolor abdominal °(Abdominal Pain) °El dolor puede tener muchas causas. Normalmente la causa del dolor abdominal no es una enfermedad y mejorará sin tratamiento. Frecuentemente puede controlarse y tratarse en casa. Su médico le realizará un examen físico y posiblemente solicite análisis de sangre y radiografías para ayudar a determinar la gravedad de su dolor. Sin embargo, en muchos casos, debe transcurrir más tiempo antes de que se pueda encontrar una causa evidente del dolor. Antes de llegar a ese punto, es posible que su médico no sepa si necesita más pruebas o un tratamiento más profundo. °INSTRUCCIONES PARA EL CUIDADO EN EL HOGAR  °Esté atento al dolor para ver si hay cambios. Las siguientes indicaciones ayudarán a aliviar cualquier molestia que pueda sentir: °· Tome solo medicamentos de venta libre o recetados, según las indicaciones del médico. °· No tome laxantes a menos que se lo haya indicado su médico. °· Pruebe con una dieta líquida absoluta (caldo, té o agua) según se lo indique su médico. Introduzca gradualmente una dieta normal, según su tolerancia. °SOLICITE ATENCIÓN MÉDICA SI: °· Tiene dolor abdominal sin explicación. °· Tiene dolor abdominal relacionado con náuseas o diarrea. °· Tiene dolor cuando orina o defeca. °· Experimenta dolor abdominal que lo despierta de noche. °· Tiene dolor abdominal que empeora o mejora cuando come alimentos. °· Tiene dolor abdominal que empeora cuando come alimentos grasosos. °· Tiene fiebre. °SOLICITE ATENCIÓN MÉDICA DE INMEDIATO SI:  °· El dolor no desaparece en un plazo máximo de 2 horas. °· No deja de (vomitar). °· El dolor se siente solo en partes del abdomen, como el lado derecho o la parte inferior izquierda del abdomen. °· Evacúa materia fecal sanguinolenta o negra, de aspecto alquitranado. °ASEGÚRESE DE QUE: °· Comprende estas instrucciones. °· Controlará su afección. °· Recibirá ayuda de inmediato si no mejora o si empeora. °Document Released: 09/26/2005  Document Revised: 10/01/2013 °ExitCare® Patient Information ©2015 ExitCare, LLC. This information is not intended to replace advice given to you by your health care provider. Make sure you discuss any questions you have with your health care provider. ° °

## 2014-11-13 NOTE — ED Notes (Signed)
Dr. Gwendolyn GrantWalden at bedside, interpretor phone in use. Pt is Spanish speaking.

## 2014-11-13 NOTE — ED Notes (Signed)
Pt is in stable condition upon d/c and is escorted by ED staff via wheelchair to lobby. Pt verbalizes understanding rt d/c instructions and medications.

## 2014-11-13 NOTE — ED Notes (Signed)
CT called.  Pt has finished drinking contrast.

## 2014-11-13 NOTE — ED Notes (Signed)
POC Pregnancy test is negative. Tommy in CT called to come get pt for CT.

## 2014-11-13 NOTE — ED Notes (Signed)
Pt presents with 1 month h/o L sided pain.  Pt reports pain every day, reports pain radiates into LLQ abdomen and into L scapula.  Pt denies any injury, denies any nausea, vomiting or diarrhea.  Denies dysuria; reports white vaginal discharge x 1 month.

## 2014-11-13 NOTE — Progress Notes (Signed)
Patient complains of left shoulder pain that radiates down her left side Patient states this has been bothering her for the past two months Patient denies any injury Patient is here with an interpreter

## 2014-11-13 NOTE — Progress Notes (Signed)
MRN: [id number] Name: Abbigaile Rockman  Sex: female Age: 44 y.o. DOB: 1971-10-10  Allergies: Review of patient's allergies indicates no known allergies.  Chief Complaint  Patient presents with  . Shoulder Pain    HPI: Patient is 44 y.o. female who has history of hypothyroidism, menorrhagia, iron deficiency anemia , patient reported to have seen provider in [facility] and was started on thyroid medication she does not remember the dosage, she brought the previous blood work done in December noticed anemia, abnormal TSH level, recently she has been having left-sided pain, she denies any recent fall or trauma she denies any chest pain or shortness of breath, denies any numbness weakness.  Past Medical History  Diagnosis Date  . Menorrhagia     Past Surgical History  Procedure Laterality Date  . Cesarean section  1997, 1999, 2007  . Wisdom tooth extraction        Medication List       This list is accurate as of: 11/13/14 10:44 AM.  Always use your most recent med list.               ferrous sulfate 325 (65 FE) MG tablet  Take 325 mg by mouth daily with breakfast.     ibuprofen 600 MG tablet  Commonly known as:  ADVIL,MOTRIN  Take 1 tablet (600 mg total) by mouth every 6 (six) hours as needed.     multivitamin tablet  Take 1 tablet by mouth daily.     omega-3 acid ethyl esters 1 G capsule  Commonly known as:  LOVAZA  Take by mouth 2 (two) times daily.        Meds ordered this encounter  Medications  . omega-3 acid ethyl esters (LOVAZA) 1 G capsule    Sig: Take by mouth 2 (two) times daily.  . ferrous sulfate 325 (65 FE) MG tablet    Sig: Take 325 mg by mouth daily with breakfast.  . Multiple Vitamin (MULTIVITAMIN) tablet    Sig: Take 1 tablet by mouth daily.    Immunization History  Administered Date(s) Administered  . Influenza,inj,Quad PF,36+ Mos 11/13/2014    Family History  Problem Relation Age of Onset  . Diabetes Father      History  Substance Use Topics  . Smoking status: Never Smoker   . Smokeless tobacco: Not on file  . Alcohol Use: No    Review of Systems   As noted in HPI  Filed Vitals:   11/13/14 1008  BP: 133/84  Pulse: 66  Temp: 98.8 F (37.1 C)  Resp: 16    Physical Exam  Physical Exam  Constitutional: No distress.  Eyes: EOM are normal. Pupils are equal, round, and reactive to light.  Cardiovascular: Normal rate and regular rhythm.   Pulmonary/Chest: Breath sounds normal. No respiratory distress. She has no wheezes. She has no rales.  Left sided rib tenderness   Musculoskeletal:  Left shoulder good range of motion, strength equal bilateral upper extremities    CBC    Component Value Date/Time   WBC 6.1 02/24/2014 0900   RBC 4.04 02/24/2014 0900   HGB 11.5* 02/24/2014 0900   HCT 34.1* 02/24/2014 0900   PLT 257 02/24/2014 0900   MCV 84.4 02/24/2014 0900    CMP     Component Value Date/Time   NA 139 02/24/2014 0900   K 4.2 02/24/2014 0900   CL 104 02/24/2014 0900   CO2 25 02/24/2014 0900   GLUCOSE 107* 02/24/2014 0900  BUN 10 02/24/2014 0900   CREATININE 0.76 02/24/2014 0900   CALCIUM 9.2 02/24/2014 0900    Lab Results  Component Value Date/Time   CHOL 180 02/24/2014 09:00 AM    No components found for: HGA1C  No results found for: AST  Assessment and Plan  Iron deficiency anemia - Plan:  Patient is currently taking iron  Pill one time a day, will repeat CBC with Differential/Platelet  Needs flu shot Flu shot given today  Rib pain on left side - Plan: DG Ribs Unilateral Left  Encounter for screening mammogram for breast cancer - Plan: MM DIGITAL SCREENING BILATERAL  Hypothyroidism, unspecified hypothyroidism type - Plan:will repeat  TSH level, apparently patient was taking thyroid medication and has not taken for the last one month, she does not method dose of medication.  Screening - Plan: COMPLETE METABOLIC PANEL WITH GFR, Hemoglobin A1c, Vit  D  25 hydroxy (rtn osteoporosis monitoring)   Health Maintenance : -Mammogram: ordered  -Vaccinations:  Flu shot given today   Return in about 3 months (around 02/11/2015) for hypothyroid.  Doris CheadleADVANI, Crue Otero, MD

## 2014-11-14 ENCOUNTER — Other Ambulatory Visit: Payer: Self-pay | Admitting: *Deleted

## 2014-11-14 ENCOUNTER — Telehealth: Payer: Self-pay

## 2014-11-14 LAB — VITAMIN D 25 HYDROXY (VIT D DEFICIENCY, FRACTURES): Vit D, 25-Hydroxy: 23 ng/mL — ABNORMAL LOW (ref 30–100)

## 2014-11-14 MED ORDER — VITAMIN D (ERGOCALCIFEROL) 1.25 MG (50000 UNIT) PO CAPS: 50000.0000 [IU] | ORAL_CAPSULE | ORAL | Status: DC

## 2014-11-14 MED ORDER — LEVOTHYROXINE SODIUM 25 MCG PO TABS: 25.0000 ug | ORAL_TABLET | Freq: Every day | ORAL | Status: DC

## 2014-11-14 NOTE — Telephone Encounter (Signed)
Pt aware [facility]

## 2014-11-14 NOTE — Telephone Encounter (Signed)
-----   Message from Doris Cheadleeepak Advani, MD sent at 11/14/2014  9:20 AM EST ----- Blood work reviewed, noticed low vitamin D, call patient advise to start ergocalciferol 50,000 units once a week for the duration of  12 weeks. Also patient has borderline anemia advised patient to continue with iron supplement. As per patient she was taking thyroid medication and has not been taking recently, her TSH level is borderline abnormal, check with the patient what dosage she was taking will resume her back on same dose.

## 2014-11-15 NOTE — ED Provider Notes (Signed)
CSN: [id number]     Arrival date & time 11/13/14  1101 History   First MD Initiated Contact with Patient 11/13/14 1145     No chief complaint on file.    (Consider location/radiation/quality/duration/timing/severity/associated sxs/prior Treatment) Patient is a 44 y.o. female presenting with abdominal pain. The history is provided by the patient.  Abdominal Pain Pain location:  LUQ Pain quality: aching   Pain radiates to:  Does not radiate Pain severity:  Moderate Onset quality:  Gradual Duration:  2 weeks Timing:  Constant Progression:  Unchanged Chronicity:  New Context: not retching and not sick contacts   Relieved by:  Nothing Associated symptoms: chest pain (L breast) and nausea   Associated symptoms: no diarrhea, no fever, no shortness of breath and no vomiting     Past Medical History  Diagnosis Date  . Menorrhagia    Past Surgical History  Procedure Laterality Date  . Cesarean section  1997, 1999, 2007  . Wisdom tooth extraction     Family History  Problem Relation Age of Onset  . Diabetes Father    History  Substance Use Topics  . Smoking status: Never Smoker   . Smokeless tobacco: Not on file  . Alcohol Use: No   OB History    Gravida Para Term Preterm AB TAB SAB Ectopic Multiple Living   3 3 3  0 0 0 0 0  3     Review of Systems  Constitutional: Negative for fever.  Respiratory: Negative for shortness of breath.   Cardiovascular: Positive for chest pain (L breast).  Gastrointestinal: Positive for nausea and abdominal pain. Negative for vomiting and diarrhea.  All other systems reviewed and are negative.     Allergies  Review of patient's allergies indicates no known allergies.  Home Medications   Prior to Admission medications   Medication Sig Start Date End Date Taking? Authorizing Provider  ferrous sulfate 325 (65 FE) MG tablet Take 325 mg by mouth daily with breakfast.    Historical Provider, MD  HYDROcodone-acetaminophen (NORCO/VICODIN)  5-325 MG per tablet Take 1 tablet by mouth every 6 (six) hours as needed for moderate pain. 11/13/14   Elwin MochaBlair Dana Debo, MD  ibuprofen (ADVIL,MOTRIN) 600 MG tablet Take 1 tablet (600 mg total) by mouth every 6 (six) hours as needed. 04/02/14   Adam PhenixJames G Arnold, MD  levothyroxine (LEVOTHROID) 25 MCG tablet Take 1 tablet (25 mcg total) by mouth daily before breakfast. 11/14/14   Doris Cheadleeepak Advani, MD  Multiple Vitamin (MULTIVITAMIN) tablet Take 1 tablet by mouth daily.    Historical Provider, MD  omega-3 acid ethyl esters (LOVAZA) 1 G capsule Take by mouth 2 (two) times daily.    Historical Provider, MD  pantoprazole (PROTONIX) 20 MG tablet Take 1 tablet (20 mg total) by mouth daily. 11/13/14   Elwin MochaBlair Harutyun Monteverde, MD  Vitamin D, Ergocalciferol, (DRISDOL) 50000 UNITS CAPS capsule Take 1 capsule (50,000 Units total) by mouth every 7 (seven) days. 11/14/14   Doris Cheadleeepak Advani, MD   BP 106/51 mmHg  Pulse 63  Temp(Src) 98.1 F (36.7 C) (Oral)  Resp 16  SpO2 95%  LMP 11/13/2014 Physical Exam  Constitutional: She is oriented to person, place, and time. She appears well-developed and well-nourished. No distress.  HENT:  Head: Normocephalic and atraumatic.  Mouth/Throat: Oropharynx is clear and moist.  Eyes: EOM are normal. Pupils are equal, round, and reactive to light.  Neck: Normal range of motion. Neck supple.  Cardiovascular: Normal rate and regular rhythm.  Exam reveals no  friction rub.   No murmur heard. Pulmonary/Chest: Effort normal and breath sounds normal. No respiratory distress. She has no wheezes. She has no rales.  Abdominal: Soft. She exhibits no distension. There is tenderness (LUQ). There is no rebound.  Musculoskeletal: Normal range of motion. She exhibits no edema.  Neurological: She is alert and oriented to person, place, and time.  Skin: She is not diaphoretic.  Nursing note and vitals reviewed.   ED Course  Procedures (including critical care time) Labs Review Labs Reviewed  CBC WITH  DIFFERENTIAL/PLATELET - Abnormal; Notable for the following:    HCT 35.6 (*)    All other components within normal limits  COMPREHENSIVE METABOLIC PANEL - Abnormal; Notable for the following:    Glucose, Bld 118 (*)    AST 55 (*)    ALT 88 (*)    All other components within normal limits  URINALYSIS, ROUTINE W REFLEX MICROSCOPIC - Abnormal; Notable for the following:    Hgb urine dipstick LARGE (*)    All other components within normal limits  LIPASE, BLOOD  URINE MICROSCOPIC-ADD ON  I-STAT TROPOININ, ED  POC URINE PREG, ED    Imaging Review No results found.   EKG Interpretation   Date/Time:  Thursday November 13 2014 11:39:37 EST Ventricular Rate:  64 PR Interval:  162 QRS Duration: 78 QT Interval:  412 QTC Calculation: 425 R Axis:   -2 Text Interpretation:  Normal sinus rhythm Normal ECG ED PHYSICIAN  INTERPRETATION AVAILABLE IN [facility] Confirmed by TEST, Record  ([id number]) on 11/15/2014 1:18:05 PM      MDM   Final diagnoses:  Left upper quadrant pain  Breast pain, left    65F here with abdominal pain, dsecribed as "stomach discomfort" for past 2 weeks. Also having L breast pain - requesting a mammogram.  Seen earlier in the day by her PCP who did not recommend coming to the ED for any further pain. Mild LUQ pain on exam, otherwise ok. Workup negative, given pain meds and PPI.   Elwin Mocha, MD 11/15/14 2026

## 2015-02-18 ENCOUNTER — Ambulatory Visit (INDEPENDENT_AMBULATORY_CARE_PROVIDER_SITE_OTHER): Payer: Self-pay | Admitting: Obstetrics & Gynecology

## 2015-02-18 ENCOUNTER — Encounter: Payer: Self-pay | Admitting: Obstetrics & Gynecology

## 2015-02-18 VITALS — BP 121/62 | HR 82 | Temp 97.6°F | Resp 20 | Wt 185.4 lb

## 2015-02-18 DIAGNOSIS — N920 Excessive and frequent menstruation with regular cycle: Secondary | ICD-10-CM

## 2015-02-18 NOTE — Progress Notes (Signed)
Interpreter - Linda PeaAlvi Delgado present for encounter.  Pt states she was supposed to be seen for follow up in September of 2015 but states she could not get appt.  She has brought her menstrual calendar for review. Pt also requests annual exam w/Pap.

## 2015-02-18 NOTE — Patient Instructions (Signed)
Insomnio (Insomnia) El insomnio es un trastorno frecuente en la capacidad para dormirse o para permanecer dormido. Puede ser un problema crnico o un trastorno del momento. En ambos casos es un problema frecuente. Puede tratarse de un problema del momento cuando se relaciona con alguna situacin de estrs o preocupacin. Es un trastorno crnico cuando se relaciona con situaciones de estrs durante las horas de vigilia o con malos hbitos de sueo. Con el tiempo, la privacin del sueo en s misma, puede hacer que el problema empeore. Las cosas ms pequeas se agravan debido al cansancio y a que la capacidad para enfrentarlas disminuye. CAUSAS  Estrs, ansiedad y depresin.  Malos hbitos para dormir.  Distracciones como mirar TV en la cama.  Siestas en horarios prximos a la hora de ir a dormir.  Involucrarse en conversaciones de gran carga emocional antes de ir a dormir.  Leer textos tcnicos antes de dormir.  Consumir alcohol y otros sedantes. Ellos pueden empeorar el problema. Pueden modificar los patrones de sueo normales y la normal actividad onrica.  Consumir estimulantes como cafena algunas horas antes de ir a dormir.  Sndromes dolorosos y las dificultades respiratorias pueden causar insomnio.  Realizar ejercicios a ltima hora de la noche.  El cambio en las zonas horarias puede causar trastornos del sueo (jet lag). En algunos casos se recomienda que otra persona observe sus patrones de sueo. Deben observar los perodos en los que no respira durante la noche (apnea del sueo). Tambin deben observar cunto tiempo duran esos perodos. Si vive slo o no tiene una persona de confianza que lo observe, podr concurrir a una clnica del sueo, en la que lo controlarn de manera profesional. La apnea del sueo requiere controles y tratamiento. Entregue su historia clnica al profesional que lo asiste Tambin infrmele las observaciones que sus familiares hayan hecho con respecto a sus  hbitos de sueo.  SNTOMAS  Sentir por la maana que no ha descansado lo suficiente.  Ansiedad y agitacin a la hora de dormir  Dificultad para dormirse o para continuar el sueo. TRATAMIENTO  El profesional le indicar un tratamiento para los trastornos subyacentes. Tambin podr aconsejarlo o ayudarlo si usted toma alcohol o se automedica con otras drogas. El tratamiento de los problemas subyacentes generalmente eliminarn los problemas de insomnio.  Podrn prescribirle medicamentos para usar durante un plazo breve. Generalmente no se recomiendan para un uso prolongado.  Generalmente no se recomiendan los medicamentos de venta libre para un uso prolongado. Podran causarle adiccin.  Puede hacer ms fcil el conciliar el sueo si realiza modificaciones en su estilo de vida tales como:  Utilizar tcnicas de relajacin que favorecen la respiracin y reducen la tensin muscular.  No practicar actividad fsica en las ltimas horas del da.  Modificar la dieta y el horario de la ltima comida. No tomar colaciones durante la noche  Trate de establecer una hora habitual para irse a dormir.  La psicoterapia puede ser de utilidad para los problemas que le ocasionan estrs y preocupaciones.  Si hay un ambiente ruidoso y no puede evitarlo, puede ser til escuchar msica suave o sonidos agradables.  Suspenda los trabajos tediosos y detallados al menos una hora antes de ir a dormir. INSTRUCCIONES PARA EL CUIDADO DOMICILIARIO  Lleve un diario. Infrmele al profesional que lo asiste sus progresos. Esto incluye todos los efectos secundarios de los medicamentos. Concurra regularmente a la consulta con el profesional Tome nota de:  La hora en que se duerme.  Las horas en las   que permanece despierto durante la noche.  La calidad del sueo.  Cmo se siente al da siguiente. Esta informacin ayudar a que el profesional pueda aconsejarlo.   Levntese de la cama si permanece despierto por  ms de 15 minutos. Lea o realice alguna actividad tranquila. Mantenga las luces bajas. Espere hasta que sienta sueo y luego vuelva a la cama.  Mantenga un ritmo constante de vigilia y sueo. Evite las siestas.  Practique actividad fsica con regularidad.  Evite las distracciones en el momento de ir a dormir. Entre las distracciones se incluyen el ver televisin o realizar alguna actividad intensa o detallada, hacer las cuentas de los gastos domsticos.  Programe un ritual para irse a dormir. Mantenga una rutina familiar relacionada con el bao diario, el cepillado de los dientes, irse a la cama todas las noches a la misma hora, escuchar msica suave. La rutina aumenta el xito de conciliar el sueo ms rpido.  Use tcnicas de relajacin. Practique rutinas que favorezcan la respiracin y alivien la tensin muscular. Tambin puede ayudarlo la visualizacin de escenas pacficas. Tambin puede tratar de controlar los pensamientos problemticos o molestos si mantiene la mente ocupada con pensamientos repetitivos o aburridos, como el antiguo consejo de contar ovejas. Tambin puede ser ms creativo, e imaginar que planta hermosas flores en su jardn, una detrs de la otra.  Durante el da, trabaje para eliminar el estrs. Cuando no es posible, algunas de las sugerencias ya presentadas lo ayudarn a reducir la ansiedad que acompaa las situaciones de estrs. EST SEGURO QUE:   Comprende las instrucciones para el alta mdica.  Controlar su enfermedad.  Solicitar atencin mdica de inmediato segn las indicaciones. Document Released: 09/26/2005 Document Revised: 12/19/2011 Exit[employer] Patient Information 2015 [employer]. This information is not intended to replace advice given to you by your health care provider. Make sure you discuss any questions you have with your health care provider.  

## 2015-02-18 NOTE — Progress Notes (Signed)
Patient ID: Linda Delgado, female   DOB: 09-06-1971, 44 y.o.   MRN: [id number]  Chief Complaint  Patient presents with  . Follow-up    HPI Linda Delgado is a 44 y.o. female.  [id number] Patient's last menstrual period was 02/05/2015 (exact date). Had prolonged menses in December but nl since then, has hot flushes and insomnia  HPI  Past Medical History  Diagnosis Date  . Menorrhagia   . Thyroid disease     Past Surgical History  Procedure Laterality Date  . Cesarean section  1997, 1999, 2007  . Wisdom tooth extraction      Family History  Problem Relation Age of Onset  . Diabetes Father     Social History History  Substance Use Topics  . Smoking status: Never Smoker   . Smokeless tobacco: Not on file  . Alcohol Use: No    No Known Allergies  Current Outpatient Prescriptions  Medication Sig Dispense Refill  . ferrous sulfate 325 (65 FE) MG tablet Take 325 mg by mouth daily with breakfast.    . ibuprofen (ADVIL,MOTRIN) 600 MG tablet Take 1 tablet (600 mg total) by mouth every 6 (six) hours as needed. 30 tablet 1  . levothyroxine (LEVOTHROID) 25 MCG tablet Take 1 tablet (25 mcg total) by mouth daily before breakfast. 30 tablet 3  . Vitamin D, Ergocalciferol, (DRISDOL) 50000 UNITS CAPS capsule Take 1 capsule (50,000 Units total) by mouth every 7 (seven) days. 12 capsule 0  . HYDROcodone-acetaminophen (NORCO/VICODIN) 5-325 MG per tablet Take 1 tablet by mouth every 6 (six) hours as needed for moderate pain. (Patient not taking: Reported on 02/18/2015) 20 tablet 0  . Multiple Vitamin (MULTIVITAMIN) tablet Take 1 tablet by mouth daily.    Marland Kitchen. omega-3 acid ethyl esters (LOVAZA) 1 G capsule Take by mouth 2 (two) times daily.    . pantoprazole (PROTONIX) 20 MG tablet Take 1 tablet (20 mg total) by mouth daily. (Patient not taking: Reported on 02/18/2015) 30 tablet 0   No current facility-administered medications for this visit.    Review of Systems Review  of Systems  Constitutional:       VMS  Respiratory: Negative.   Genitourinary: Negative for vaginal bleeding and vaginal discharge.  Psychiatric/Behavioral: Positive for sleep disturbance.    Blood pressure 121/62, pulse 82, temperature 97.6 F (36.4 C), temperature source Oral, resp. rate 20, weight 185 lb 6.4 oz (84.097 kg), last menstrual period 02/05/2015.  Physical Exam Physical Exam  Constitutional: She is oriented to person, place, and time. She appears well-developed. No distress.  HENT:  Head: Normocephalic.  Neck: Normal range of motion.  Pulmonary/Chest: Effort normal. No respiratory distress.  Abdominal: Soft. She exhibits no mass. There is no tenderness.  Genitourinary: Vagina normal and uterus normal. No vaginal discharge found.  No mass   Neurological: She is alert and oriented to person, place, and time.  Skin: Skin is warm and dry.  Psychiatric: She has a normal mood and affect. Her behavior is normal.    Data Reviewed   Assessment    Perimenopause, h/o DUB  Now improved. Insomnia     Plan    Info on insomnia, get mammogram and go to pap screening clinic        Kaelem Brach 02/18/2015, 3:07 PM

## 2015-03-04 ENCOUNTER — Ambulatory Visit: Payer: Self-pay | Admitting: Obstetrics & Gynecology

## 2015-03-05 ENCOUNTER — Ambulatory Visit: Payer: Self-pay | Attending: Internal Medicine

## 2015-03-10 ENCOUNTER — Encounter: Payer: Self-pay | Admitting: Internal Medicine

## 2015-03-10 ENCOUNTER — Ambulatory Visit: Payer: Self-pay | Attending: Internal Medicine | Admitting: Internal Medicine

## 2015-03-10 VITALS — BP 131/83 | HR 87 | Temp 98.0°F | Resp 16 | Wt 185.4 lb

## 2015-03-10 DIAGNOSIS — R519 Headache, unspecified: Secondary | ICD-10-CM

## 2015-03-10 DIAGNOSIS — R42 Dizziness and giddiness: Secondary | ICD-10-CM | POA: Insufficient documentation

## 2015-03-10 DIAGNOSIS — E039 Hypothyroidism, unspecified: Secondary | ICD-10-CM | POA: Insufficient documentation

## 2015-03-10 DIAGNOSIS — R51 Headache: Secondary | ICD-10-CM | POA: Insufficient documentation

## 2015-03-10 LAB — COMPLETE METABOLIC PANEL WITH GFR
ALT: 120 U/L — ABNORMAL HIGH (ref 0–35)
AST: 81 U/L — ABNORMAL HIGH (ref 0–37)
Albumin: 4.6 g/dL (ref 3.5–5.2)
Alkaline Phosphatase: 82 U/L (ref 39–117)
BUN: 11 mg/dL (ref 6–23)
CO2: 25 mEq/L (ref 19–32)
Calcium: 9.1 mg/dL (ref 8.4–10.5)
Chloride: 102 mEq/L (ref 96–112)
Creat: 0.65 mg/dL (ref 0.50–1.10)
GFR, Est African American: >89 mL/min
GFR, Est Non African American: >89 mL/min
Glucose, Bld: 130 mg/dL — ABNORMAL HIGH (ref 70–99)
Potassium: 4.3 mEq/L (ref 3.5–5.3)
Sodium: 139 mEq/L (ref 135–145)
Total Bilirubin: 0.5 mg/dL (ref 0.2–1.2)
Total Protein: 7.5 g/dL (ref 6.0–8.3)

## 2015-03-10 LAB — TSH: TSH: 2.609 u[IU]/mL (ref 0.350–4.500)

## 2015-03-10 MED ORDER — MECLIZINE HCL 25 MG PO TABS: 25.0000 mg | ORAL_TABLET | Freq: Three times a day (TID) | ORAL | Status: DC | PRN

## 2015-03-10 NOTE — Progress Notes (Signed)
MRN: [id number] Name: Linda Delgado  Sex: female Age: 44 y.o. DOB: [birth year]  Allergies: Review of patient's allergies indicates no known allergies.  Chief Complaint  Patient presents with  . Follow-up    HPI: Patient is 44 y.o. female who has history of hypothyroidism comes today for followup, she's requesting refill on her medication, the pain repeat thyroid test done, she does complain of headache which sometimes goes from front to back and last for a few hours, denies any numbness weakness denies any chest pain or shortness of breath but has been complaining of some dizziness she described as room spinning sensation she does complain of ear itching but denies any pain or ear discharge, denies any fever chills.  Past Medical History  Diagnosis Date  . Menorrhagia   . Thyroid disease     Past Surgical History  Procedure Laterality Date  . Cesarean section  1997, 1999, 2007  . Wisdom tooth extraction        Medication List       This list is accurate as of: 03/10/15  3:01 PM.  Always use your most recent med list.               ferrous sulfate 325 (65 FE) MG tablet  Take 325 mg by mouth daily with breakfast.     HYDROcodone-acetaminophen 5-325 MG per tablet  Commonly known as:  NORCO/VICODIN  Take 1 tablet by mouth every 6 (six) hours as needed for moderate pain.     ibuprofen 600 MG tablet  Commonly known as:  ADVIL,MOTRIN  Take 1 tablet (600 mg total) by mouth every 6 (six) hours as needed.     levothyroxine 25 MCG tablet  Commonly known as:  LEVOTHROID  Take 1 tablet (25 mcg total) by mouth daily before breakfast.     meclizine 25 MG tablet  Commonly known as:  ANTIVERT  Take 1 tablet (25 mg total) by mouth 3 (three) times daily as needed for dizziness.     multivitamin tablet  Take 1 tablet by mouth daily.     omega-3 acid ethyl esters 1 G capsule  Commonly known as:  LOVAZA  Take by mouth 2 (two) times daily.     pantoprazole 20 MG  tablet  Commonly known as:  PROTONIX  Take 1 tablet (20 mg total) by mouth daily.     Vitamin D (Ergocalciferol) 50000 UNITS Caps capsule  Commonly known as:  DRISDOL  Take 1 capsule (50,000 Units total) by mouth every 7 (seven) days.        Meds ordered this encounter  Medications  . meclizine (ANTIVERT) 25 MG tablet    Sig: Take 1 tablet (25 mg total) by mouth 3 (three) times daily as needed for dizziness.    Dispense:  30 tablet    Refill:  1    Immunization History  Administered Date(s) Administered  . Influenza,inj,Quad PF,36+ Mos 11/13/2014    Family History  Problem Relation Age of Onset  . Diabetes Father     History  Substance Use Topics  . Smoking status: Never Smoker   . Smokeless tobacco: Not on file  . Alcohol Use: No    Review of Systems   As noted in HPI  Filed Vitals:   03/10/15 1420  BP: 131/83  Pulse: 87  Temp: 98 F (36.7 C)  Resp: 16    Physical Exam  Physical Exam  Constitutional: She is oriented to person, place, and time. No  distress.  HENT:  Both TMs visualized not congested  Eyes: EOM are normal. Pupils are equal, round, and reactive to light.  Neck: Neck supple.  Cardiovascular: Normal rate and regular rhythm.   Pulmonary/Chest: Breath sounds normal. No respiratory distress. She has no wheezes. She has no rales.  Musculoskeletal: She exhibits no edema.  Neurological: She is alert and oriented to person, place, and time. She has normal reflexes. No cranial nerve deficit. Coordination normal.    CBC    Component Value Date/Time   WBC 6.0 11/13/2014 1137   RBC 4.23 11/13/2014 1137   HGB 12.1 11/13/2014 1137   HCT 35.6* 11/13/2014 1137   PLT 271 11/13/2014 1137   MCV 84.2 11/13/2014 1137   LYMPHSABS 2.5 11/13/2014 1137   MONOABS 0.4 11/13/2014 1137   EOSABS 0.2 11/13/2014 1137   BASOSABS 0.0 11/13/2014 1137    CMP     Component Value Date/Time   NA 138 11/13/2014 1137   K 3.8 11/13/2014 1137   CL 103 11/13/2014  1137   CO2 27 11/13/2014 1137   GLUCOSE 118* 11/13/2014 1137   BUN 10 11/13/2014 1137   CREATININE 0.75 11/13/2014 1137   CREATININE 0.75 11/13/2014 1045   CALCIUM 9.2 11/13/2014 1137   PROT 7.8 11/13/2014 1137   ALBUMIN 4.4 11/13/2014 1137   AST 55* 11/13/2014 1137   ALT 88* 11/13/2014 1137   ALKPHOS 70 11/13/2014 1137   BILITOT 0.6 11/13/2014 1137   GFRNONAA >90 11/13/2014 1137   GFRNONAA >89 11/13/2014 1045   GFRAA >90 11/13/2014 1137   GFRAA >89 11/13/2014 1045    Lab Results  Component Value Date/Time   CHOL 180 02/24/2014 09:00 AM    Lab Results  Component Value Date/Time   HGBA1C 5.5 11/13/2014 10:45 AM   HGBA1C 5.1 02/10/2014 12:35 PM    Lab Results  Component Value Date/Time   AST 55* 11/13/2014 11:37 AM    Assessment and Plan  Hypothyroidism, unspecified hypothyroidism type - Plan: currently patient is on levothyroxine 25 mcg daily, recheck TSH level,  Dizziness and giddiness - Plan:vitals are stable, nonspecific, trial of meclizine (ANTIVERT) 25 MG tablet, COMPLETE METABOLIC PANEL WITH GFR  Headache in back of head Benign neurological examination ? Tension headache, she'll try over-the-counter NSAIDs, Tylenol when necessary, if not getting better we'll try low-dose amitriptyline.   Return in about 3 months (around 06/10/2015), or if symptoms worsen or fail to improve.   This note has been created with Education officer, [employer] speech recognition software and smart phrase technology. Any transcriptional errors are unintentional.    Doris CheadleADVANI, Diogenes Whirley, MD

## 2015-03-10 NOTE — Progress Notes (Signed)
Patient here with interpreter Patient is requesting a refill on her thyroid medications Patient also complains of headaches and some dizziness that started about two  Weeks ago

## 2015-03-11 ENCOUNTER — Telehealth: Payer: Self-pay

## 2015-03-11 ENCOUNTER — Other Ambulatory Visit: Payer: Self-pay | Admitting: Internal Medicine

## 2015-03-11 DIAGNOSIS — R945 Abnormal results of liver function studies: Secondary | ICD-10-CM

## 2015-03-11 DIAGNOSIS — R7989 Other specified abnormal findings of blood chemistry: Secondary | ICD-10-CM

## 2015-03-11 NOTE — Telephone Encounter (Signed)
-----   Message from Doris Cheadleeepak Advani, MD sent at 03/11/2015 10:33 AM EDT ----- Call and let the patient know that her TSH level is in normal range, continue with current dose of levothyroxine. Also noted abnormal LFTs,( may be secondary to fatty liver reported on CAT scan) I have ordered hepatitis panel to rule out viral hepatitis, advise patient to do the blood work next week

## 2015-03-11 NOTE — Telephone Encounter (Signed)
Interpreter used-Linda Delgado Patient is aware of her lab results and to make an appointment to return for additional lab work

## 2015-03-12 ENCOUNTER — Ambulatory Visit: Payer: Self-pay | Attending: Internal Medicine

## 2015-03-12 DIAGNOSIS — R7989 Other specified abnormal findings of blood chemistry: Secondary | ICD-10-CM

## 2015-03-12 DIAGNOSIS — R945 Abnormal results of liver function studies: Secondary | ICD-10-CM

## 2015-03-13 LAB — HEPATITIS B SURFACE ANTIBODY,QUALITATIVE: Hep B S Ab: POSITIVE — AB

## 2015-03-13 LAB — HEPATITIS C RNA QUANTITATIVE: HCV Quantitative: NOT DETECTED IU/mL (ref <15)

## 2015-03-13 LAB — HEPATITIS B SURFACE ANTIGEN: Hepatitis B Surface Ag: NEGATIVE

## 2015-03-16 ENCOUNTER — Telehealth: Payer: Self-pay

## 2015-03-16 NOTE — Telephone Encounter (Signed)
Interpreter line used eduardo ID# [id number] Patient not available Message left on voice mail to return our call

## 2015-03-16 NOTE — Telephone Encounter (Signed)
-----   Message from Doris Cheadleeepak Advani, MD sent at 03/16/2015 11:22 AM EDT ----- Call and let the patient know that her hepatitis panel is normal

## 2015-03-17 NOTE — Telephone Encounter (Signed)
Patient called returning nurse's phone call, to review results. Please f/u

## 2015-03-17 NOTE — Telephone Encounter (Signed)
Pt returning nurse's call, please f/u with pt using interpreter.

## 2017-02-23 ENCOUNTER — Ambulatory Visit: Payer: Self-pay | Attending: Internal Medicine | Admitting: Internal Medicine

## 2017-02-23 ENCOUNTER — Encounter: Payer: Self-pay | Admitting: Internal Medicine

## 2017-02-23 VITALS — BP 126/77 | HR 84 | Temp 98.1°F | Resp 16 | Ht 67.0 in | Wt 192.6 lb

## 2017-02-23 DIAGNOSIS — E6609 Other obesity due to excess calories: Secondary | ICD-10-CM | POA: Insufficient documentation

## 2017-02-23 DIAGNOSIS — E039 Hypothyroidism, unspecified: Secondary | ICD-10-CM | POA: Insufficient documentation

## 2017-02-23 DIAGNOSIS — J029 Acute pharyngitis, unspecified: Secondary | ICD-10-CM | POA: Insufficient documentation

## 2017-02-23 DIAGNOSIS — N644 Mastodynia: Secondary | ICD-10-CM | POA: Insufficient documentation

## 2017-02-23 DIAGNOSIS — Z23 Encounter for immunization: Secondary | ICD-10-CM | POA: Insufficient documentation

## 2017-02-23 DIAGNOSIS — Z79899 Other long term (current) drug therapy: Secondary | ICD-10-CM | POA: Insufficient documentation

## 2017-02-23 DIAGNOSIS — Z803 Family history of malignant neoplasm of breast: Secondary | ICD-10-CM | POA: Insufficient documentation

## 2017-02-23 DIAGNOSIS — K760 Fatty (change of) liver, not elsewhere classified: Secondary | ICD-10-CM | POA: Insufficient documentation

## 2017-02-23 DIAGNOSIS — Z6830 Body mass index (BMI) 30.0-30.9, adult: Secondary | ICD-10-CM | POA: Insufficient documentation

## 2017-02-23 DIAGNOSIS — Z114 Encounter for screening for human immunodeficiency virus [HIV]: Secondary | ICD-10-CM | POA: Insufficient documentation

## 2017-02-23 NOTE — Patient Instructions (Addendum)
Plan de alimentacin cardiosaludable (Heart-Healthy Eating Plan) La planificacin de las comidas cardiosaludables incluye lo siguiente:  Limitar las grasas poco saludables.  Aumentar las grasas saludables.  Hacer otros pequeos cambios en la dieta. Es posible que tenga que hablar con el mdico o con un especialista en alimentacin (nutricionista) para crear un plan de alimentacin que sea adecuado para usted. QU TIPOS DE GRASAS DEBO ELEGIR?  Elija las grasas saludables. Estas incluyen aceite de oliva y de canola, semillas de lino, nueces, almendras y semillas.  Consuma ms grasas omega-3. Estas incluyen salmn, caballa, sardinas, atn, aceite de lino y semillas de lino molidas. Trate de comer pescado al Borders Groupmenos dos veces por semana.  Limite el consumo de grasas saturadas,  las cuales suelen Circuit Cityencontrarse en los productos de origen animal, como carnes, Lake Stevensmantequilla y crema.  Las grasas saturadas de origen vegetal incluyen aceite de palma, de palmiste y de coco.  Evite los alimentos con aceites parcialmente hidrogenados. Estos incluyen margarina en barra, algunas margarinas untables, galletas, galletitas y otros productos horneados. Estos contienen grasas trans. QU PAUTAS GENERALES DEBO SEGUIR?  Lea atentamente las etiquetas de los alimentos. Identifique los que contienen grasas trans o altas cantidades de grasas saturadas.  Llene la mitad del plato con verduras y ensaladas de hojas verdes. Coma 4 o 5porciones de verduras Air cabin crewpor da. Una porcin de verduras equivale a lo siguiente:  1 taza de verduras de hoja crudas.   taza de verduras en trozos crudas o cocidas.   taza de jugo de verduras.  Llene un cuarto del plato con cereales integrales. Busque la palabra "integral" en Estate agentel primer lugar de la lista de ingredientes.  Llene un cuarto del plato con alimentos con protenas magras.  Coma 4 o 5porciones de frutas por da. Una porcin de fruta equivale a lo siguiente:  Una fruta  mediana entera.  taza de fruta disecada.   taza de fruta fresca, congelada o enlatada.  taza de jugo 100% de fruta.  Consuma ms alimentos con fibra soluble, los cuales incluyen Chain of Rocksmanzanas, brcoli, zanahorias, frijoles, guisantes y Qatarcebada. Trate de consumir de 20a 30g de The Northwestern Mutualfibra por da.  Coma ms comidas caseras. Coma menos comida de restaurantes, bufs y comida rpida.  Limite o evite el alcohol.  Limite los alimentos con alto contenido de almidn y International aid/development workerazcar.  Evite las comidas fritas.  Evite frer los alimentos. En cambio, trate de cocinarlos en el horno, en la plancha o en la parrilla, o hervirlos. Tambin puede reducir las grasas de la siguiente forma:  Quite la piel de las aves.  Quite todas las grasas visibles de las carnes.  Espume la grasa de los guisos, las sopas y las salsas antes de servirlos.  Cocine al vapor las verduras en agua o caldo.  Baje de peso si es necesario.  Coma 4 o 5porciones de frutos secos, legumbres y semillas por semana:  Una porcin de frijoles o legumbres secos equivale a taza despus de su coccin.  Una porcin de frutos secos equivale a 1onzas.  Una porcin de semillas equivale a onza o una cucharada.  Tal vez deba llevar un registro de la cantidad de sal o sodio que ingiere, especialmente si tiene la presin hipertensin arterial. Hable con el mdico o el nutricionista para obtener ms informacin. QU ALIMENTOS PUEDO COMER? Cereales Panes, incluido el pan francs, blanco, pita, de [place] Paloma-Lost Creektrigo, de pasas de Wood[place]uva, de centeno, de avena e Kauneonga Lakeitaliano. Tortillas que no estn fritas ni elaboradas con manteca de cerdo ni grasas trans.  Panecillos bajos en grasas, incluidos los panes para perros calientes y [place], y los bollitos tipo ingls. Galletas. Muffins. Waffles. Panqueques. Palomitas de maz con bajo contenido calrico. Cereales integrales. Pan sin levadura. Tostada Melba. Pretzels. Palitos de pan. Galletas. Colaciones bajas en  grasas. Galletitas Henry Schein, Avery Dennison, las que tienen forma de [place], las [place], el pan cimo, las [place], las que tienen forma de [place] y las de centeno. Arroz y pastas, incluido el arroz integral y las pastas elaboradas con cereales integrales. Verduras Todas las verduras. Frutas Todas las frutas, pero limite el [place]. [place] y [place] fuentes de protenas Carne de res, [place], cerdo y cordero magras sin grasa. Pollo y pavo sin piel. Todos los pescados y Liberty Global. Pato salvaje, conejo, faisn y venado. Claras de huevo o sustitutos del huevo bajos en colesterol. Porotos, guisantes, lentejas secos y tofu. Semillas y la mayora de los frutos secos. Lcteos Quesos descremados y semidescremados, entre ellos, ricota, queso en hebras y Garment/textile technologist. Leche descremada o al 1% que sea lquida, en polvo o evaporada. Suero de WPS Resources elaborado con Molson Coors Brewing. Yogur descremado o bajo en grasas. Bebidas Agua mineral. Bebidas gaseosas dietticas. Dulces y postres Sorbetes y helados de fruta. [place], [place], [place], jalea y [place]. Merengues y gelatinas. Caramelos de Science Applications International, como caramelos duros, caramelos de goma, pastillas de goma, mentas, malvaviscos y pequeas cantidades de chocolate amargo. Torta ngel. Coma todos los dulces y postres con moderacin. Grasas y Writer no hidrogenadas (sin grasas trans). Aceites vegetales, incluido el de soja, ssamo, girasol, [place], man, crtamo, maz, canola y semillas de algodn. Alios para ensalada o mayonesa elaborados con aceite vegetal. Limite las grasas y los aceites agregados que [place] para Water quality scientist, Development worker, community, preparar ensaladas y las cremas untables. Otros Cacao en polvo. T o caf. Todos los alios y condimentos. Los artculos mencionados arriba pueden no ser Raytheon de las bebidas o los alimentos recomendados. Comunquese con el nutricionista para conocer ms opciones. QU ALIMENTOS NO SE  RECOMIENDAN? Cereales Panes elaborados con grasas saturadas o trans, aceites o Eastman Kodak. Croissants. Panecillos de mantequilla. Panes de queso. Panecillos dulces. Rosquillas. Palomitas de maz con mantequilla. Fideos chow mein. Galletitas con FedEx de grasas, como las que contienen queso o [place]. Carnes y [address] fuentes de protenas Carnes grasas, como perros calientes, [place] de res, [address], puntas de [place], [place], asado de [place] o [place], y carnero. Fiambres altos en grasas, como salame y [place]. Caviar. Pato y ganso domsticos. Vsceras, como riones, hgado, [place], y Programmer, multimedia. Lcteos Crema, crema agria, queso crema y [place] cottage con crema. Quesos elaborados con Eastman Kodak, incluido el queso [place] (bleu), [place], [place], [place], [place], [place], suizo, cheddar, camembert y [place]. Leche entera o al 2% que sea [place], evaporada o condensada. Suero de Liberty Global. Salsa de crema o queso alta en grasas. Yogur elaborado con Eastman Kodak. Bebidas Refrescos regulares y jugos con agregado de International aid/development worker. Dulces y Hughes Supply. Pudin. Galletas. Tortas que no sean la torta ngel. Caramelos que contengan chocolate con leche o chocolate blanco, grasa hidrogenada, mantequilla, coco o ingredientes desconocidos. Almbares con mantequilla. Helados o bebidas elaboradas con helado con alto contenido de grasas. Grasas y [address] que [place], [place] (the territory South of 60 deg S) de carne o materia grasa. Manteca de cacao, aceites hidrogenados, aceite de palma, aceite de coco, aceite de palmiste. A menudo, estos se encuentran en los productos horneados, los caramelos, las comidas fritas, las cremas no lcteas y las coberturas batidas. Grasas y MetLife slidas,  incluida la grasa del tocino, el cerdo Fort Paynesalado, la Rebeccamanteca de cerdo y Civil engineer, contractingla mantequilla. Sustitutos de crema no lctea, como cremas para caf y sustitutos de crema agria. Alios para ensaladas elaborados con aceites  desconocidos, queso o crema agria. Los artculos mencionados arriba pueden no ser Raytheonuna lista completa de las bebidas y los alimentos que se Theatre stage managerdeben evitar. Comunquese con el nutricionista para obtener ms informacin. Esta informacin no tiene Theme park managercomo fin reemplazar el consejo del mdico. Asegrese de hacerle al mdico cualquier pregunta que tenga. Document Released: 03/27/2012 Document Revised: 10/17/2014 Document Reviewed: 03/20/2014 Elsevier Interactive Patient Education  2017 Elsevier Inc.  TilghmantonVacuna Tdap (contra la difteria, el ttanos y la tosferina): Lo que debe saber (Tdap Vaccine [Tetanus, Diphtheria, and Pertussis]: What You Need to Know)  1. Por qu vacunarse? El ttanos, la difteria y la tosferina son enfermedades muy graves. La vacuna Tdap nos puede proteger de estas enfermedades. Adems, la vacuna Tdap que se aplica a las Market researcherembarazadas puede proteger a los bebs recin nacidos contra la tosferina. En la actualidad, el Belle Prairie CityTANOS (trismo) es una enfermedad poco frecuente en los [place]. Provoca la contraccin y el endurecimiento dolorosos de los msculos, por lo general, de todo el cuerpo.  Puede causar el endurecimiento de los msculos de la cabeza y el cuello, de modo que impide abrir la boca, tragar y en algunos casos, Industrial/product designerrespirar. El ttanos causa la muerte de aproximadamente 1de cada 10personas que contraen la infeccin, incluso despus de que reciben la mejor atencin mdica. La DIFTERIA tambin es poco frecuente en los Estados [place]idos C.H. Robinson Worldwidehoy en da. Puede causar la formacin de una membrana gruesa en la parte posterior de la garganta.  Esto tiene como consecuencia problemas respiratorios, insuficiencia cardaca, parlisis y Leedsmuerte. La TOSFERINA (tos convulsa) provoca episodios de tos intensa que pueden dificultar la respiracin y provocar vmitos y trastornos del sueo.  Tambin puede causar prdida de peso, incontinencia y fractura de Talking Rockcostillas. Dos de cada 100 adolescentes y 5 de cada  100 adultos con tosferina deben ser hospitalizados o tienen complicaciones, que podran incluir neumona y Schneidermuerte. Estas enfermedades son provocadas por bacterias. La difteria y la tosferina se contagian de Neomia Dearuna persona a otra a travs de las secreciones de la tos o el estornudo. El ttanos ingresa al organismo a travs de cortes, rasguos o heridas. Antes de las vacunas, en los Estados [place] se informaban 200000 casos de difteria, 200000 casos de tosferina y cientos de casos de ttanos cada ao. Desde el inicio de la vacunacin, los informes de casos de ttanos y difteria han disminuido alrededor del 99%, y de tosferina, alrededor del 80%. 2. Madilyn FiremanVacuna Tdap La vacuna Tdap protege a adolescentes y adultos contra el ttanos, la difteria y la tosferina. Una dosis de Tdap se administra a los 11 o 12 aos. Las Eli Lilly and Companypersonas que no recibieron la vacuna Tdap a esa edad deben recibirla tan pronto como sea posible. Es muy importante que los mdicos y todos aquellos que tengan contacto cercano con bebs menores de 12meses reciban la vacuna Tdap. Las mujeres deben recibir una dosis de Tdap en cada Psychiatristembarazo, para proteger al recin nacido de la tosferina. Los nios tienen mayor riesgo de complicaciones graves y potencialmente mortales debido a la tosferina. Otra vacuna llamada Td protege contra el ttanos y la difteria, pero no contra la tosferina. Todos deben recibir una dosis de refuerzo de Td cada 10 aos. La Tdap puede aplicarse como uno de estos refuerzos si nunca antes recibi esta vacuna. Tambin se  puede aplicar despus de un corte o quemadura grave para prevenir la infeccin por ttanos. El mdico o la persona que le aplique la vacuna puede darle ms informacin al Beazer Homes. La Tdap puede administrarse de manera segura simultneamente con otras vacunas. 3. Algunas personas no deben recibir la Energy Transfer Partners persona que alguna vez tuvo una reaccin alrgica potencialmente mortal a Neomia Dear dosis previa de cualquier  vacuna contra el ttanos, la difteria o la tosferina, O que tenga una alergia grave a cualquiera de los componentes de esta vacuna, no debe recibir la vacuna Tdap. Informe a la persona que le aplica la vacuna si tiene cualquier alergia grave.  Una persona que estuvo en estado de coma o sufri mltiples convulsiones en el trmino de los 7das despus de recibir una dosis de DTP o DTaP, o una dosis previa de Tdap, no debe recibir la vacuna Tdap, salvo que se haya encontrado otra causa que no fuera la vacuna. An puede recibir la Td.  Consulte con su mdico si:  tiene convulsiones u otro problema del sistema nervioso,  tuvo hinchazn o dolor intenso despus de cualquier vacuna contra la difteria o el ttanos,  alguna vez ha sufrido el sndrome de [place],  no se siente Research scientist (life sciences) en que se ha programado la vacuna. 4. Riesgos Con cualquier medicamento, incluyendo las vacunas, existe la posibilidad de que aparezcan efectos secundarios. Suelen ser leves y desaparecen por s solos. Tambin son posibles las reacciones graves, pero en raras ocasiones. La Harley-Davidson de las personas a las que se les aplica la vacuna Tdap no tienen ningn problema. Problemas leves despus de la vacuna Tdap (No interfirieron en otras actividades)  Dolor en el lugar donde se aplic la vacuna (alrededor de 3 de cada 4 adolescentes o 2 de cada 3 adultos).  Enrojecimiento o hinchazn en el lugar donde se aplic la vacuna (1 de cada 5 personas).  Fiebre leve de al menos 100,64F (38C) (hasta alrededor de 1 cada 25 adolescentes o 1 de cada 100 adultos).  Dolor de cabeza (alrededor de 3 o 4 de cada 10 personas).  Cansancio (alrededor de 1 de cada 3 o 4 personas).  Nuseas, vmitos, diarrea, dolor de estmago (hasta 1 de cada 4 adolescentes o 1 de cada 10 adultos).  Escalofros, dolores articulares (alrededor de 1de cada 10personas).  Dolores corporales (alrededor de 1de cada 3 o 4personas).  Erupcin  cutnea, inflamacin de los ganglios (poco frecuente). Problemas moderados despus de recibir la vacuna Tdap (Interfirieron en otras actividades, pero no requirieron atencin mdica)  Art therapist donde se aplic la vacuna (hasta 1de cada 5 o 6).  Enrojecimiento o inflamacin en el lugar donde se aplic la vacuna (hasta alrededor de 1 de cada 16adolescentes o 1 de cada 12adultos).  Fiebre de ms de 102F (38,8C) (alrededor de 1 de cada 100 adolescentes o 1 de cada 250 adultos).  Dolor de cabeza (alrededor de 1de cada 7adolescentes o 1de cada 10adultos).  Nuseas, vmitos, diarrea, dolor de estmago (hasta 1 o 3 de cada 100 personas).  Hinchazn de todo el brazo en el que se aplic la vacuna (hasta alrededor de 1de cada 500personas). Problemas graves despus de la vacuna Tdap (Impidieron Education officer, environmental las actividades habituales; requirieron atencin mdica)  Inflamacin, dolor intenso, sangrado y enrojecimiento en el brazo en que se aplic la vacuna (poco frecuente). Problemas que podran ocurrir despus de cualquier vacuna:  Las personas a veces se desmayan despus de un procedimiento mdico, incluida la vacunacin.  Si permanece sentado o recostado durante 15 minutos puede ayudar a Lubrizol Corporation y las lesiones causadas por las cadas. Informe al mdico si se siente mareado, tiene cambios en la visin o zumbidos en los odos.  Algunas personas sienten un dolor intenso en el hombro y tienen dificultad para mover el brazo donde se coloc la vacuna. Esto sucede con muy poca frecuencia.  Cualquier medicamento puede causar una reaccin alrgica grave. Dichas reacciones son Lynnae Sandhoff poco frecuentes con una vacuna (se calcula que menos de 1en un milln de dosis) y se producen de unos minutos a unas horas despus de Arts development officer. Al igual que con cualquier Automatic Data, existe una probabilidad muy remota de que una vacuna cause una lesin grave o la [place]. Se controla  permanentemente la seguridad de las vacunas. Para obtener ms informacin, visite: http://floyd.org/. 5. Qu pasa si hay un problema grave? A qu signos debo estar atento?  Observe todo lo que le preocupe, como signos de una reaccin alrgica grave, fiebre muy alta o comportamiento fuera de lo normal.  Los signos de una reaccin alrgica grave pueden incluir ronchas, hinchazn de la cara y la garganta, dificultad para respirar, latidos cardacos acelerados, mareos y debilidad. Generalmente, estos comenzaran entre unos pocos minutos y algunas horas despus de la vacunacin. Qu debo hacer?  Si usted piensa que se trata de una reaccin alrgica grave o de otra emergencia que no puede esperar, llame al 911 o lleve a la persona al hospital ms cercano. Sino, llame a su mdico.  Despus, la reaccin debe informarse al [address] de Informacin sobre Efectos Adversos de las [place] (Vaccine Adverse Event Reporting System, VAERS). Su mdico puede presentar este informe, o puede hacerlo usted mismo a travs del sitio web de VAERS, en www.vaers.LAgents.no, o llamando al [phone]. VAERS no brinda recomendaciones mdicas. 6. SunTrust de Compensacin de Daos por American Electric Power [place] de Compensacin de Daos por Administrator, arts (National Vaccine Injury Compensation Program, VICP) es un programa federal que fue creado para Patent examiner a las personas que puedan haber sufrido daos al recibir ciertas vacunas. Aquellas personas que consideren que han sufrido un dao como consecuencia de una vacuna y [place] saber ms acerca del programa y de cmo presentar Roslynn Amble, pueden llamar al [phone] o visitar su sitio web en SpiritualWord.at. Hay un lmite de tiempo para presentar un reclamo de compensacin. 7. Cmo puedo obtener ms informacin?  Consulte a su mdico. Este puede darle el prospecto de la vacuna o recomendarle otras fuentes de informacin.  Comunquese con  el servicio de salud de su localidad o [address].  Comunquese con los Centros para Air traffic controller y la Prevencin de Child psychotherapist for Disease Control and Prevention , CDC).  Llame al [phone] (1-800-CDC-INFO), o  visite el sitio web [employer] Financial en PicCapture.uy. Declaracin de informacin sobre la vacuna contra la difteria, el ttanos y la tosferina (Tdap) de los CDC (24/02/15) Esta informacin no tiene Theme park manager el consejo del mdico. Asegrese de hacerle al mdico cualquier pregunta que tenga. Document Released: 09/12/2012 Document Revised: 10/17/2014 Document Reviewed: 01/08/2014 Elsevier Interactive Patient Education  2017 ArvinMeritor.

## 2017-02-23 NOTE — Progress Notes (Signed)
Patient ID: Linda Delgado, female    DOB: 1971/01/23  MRN: [id number]  CC:  Breast pain  Subjective:  Linda Delgado is a 46 y.o. female who presents for follow up visitt. Her concerns today include:  1.  LT breast pain: intermittent pain for several years.   -starts in arm pit to nipple.  Occurs about 3 x a mth and last for hours. No initiating factors. Takes Ibuprofen at times -last MMG in 2013 normal.  Fhx of breast cancer in paternal aunt. - 2. Hx of hypothyroid: states med was d/c by doctor 1.5 yrs ago.  Said she no longer needed it.  3.  Hx of abnormal LFT in 2016.  CT scan abdomin done in 2016 revealed fatty liver changes.  Viral hepatitis screens were negative.  Patient Active Problem List   Diagnosis Date Noted  . Mastalgia in female 02/23/2017  . NAFLD (nonalcoholic fatty liver disease) 40/98/1191  . Class 1 obesity due to excess calories without serious comorbidity with body mass index (BMI) of 30.0 to 30.9 in adult 02/23/2017  . Thyroid activity decreased 11/13/2014  . Menorrhagia 04/02/2014     Current Outpatient Prescriptions on File Prior to Visit  Medication Sig Dispense Refill  . ibuprofen (ADVIL,MOTRIN) 600 MG tablet Take 1 tablet (600 mg total) by mouth every 6 (six) hours as needed. 30 tablet 1  . ferrous sulfate 325 (65 FE) MG tablet Take 325 mg by mouth daily with breakfast.    . Multiple Vitamin (MULTIVITAMIN) tablet Take 1 tablet by mouth daily.    Marland Kitchen omega-3 acid ethyl esters (LOVAZA) 1 G capsule Take by mouth 2 (two) times daily.     No current facility-administered medications on file prior to visit.     No Known Allergies   ROS: Review of Systems  Constitutional: Negative for unexpected weight change.  HENT: Positive for sore throat (x3 days).   Endocrine: Positive for cold intolerance, heat intolerance and polyuria.     PHYSICAL EXAM: BP 126/77 (BP Location: Right Arm, Patient Position: Sitting, Cuff Size: Large)    Pulse 84   Temp 98.1 F (36.7 C) (Oral)   Resp 16   Ht 5\' 7"  (1.702 m)   Wt 192 lb 9.6 oz (87.4 kg)   LMP 01/28/2017 (Exact Date)   SpO2 99%   BMI 30.17 kg/m   Wt Readings from Last 3 Encounters:  02/23/17 192 lb 9.6 oz (87.4 kg)  03/10/15 185 lb 6.4 oz (84.1 kg)  02/18/15 185 lb 6.4 oz (84.1 kg)   Physical Exam  Constitutional: She appears well-developed. No distress.  HENT:  Throat with slight erythema but no exudates  Neck:  No thyroid enlargement or nodules.  Cardiovascular: Normal rate and regular rhythm.   Pulmonary/Chest: Breath sounds normal.  Abdominal: Soft.  No organomegaly.   Breast exam: No dimpling of the skin,no discharge is expressed from the nipples, no masses palpated no cervical lymphadenopathy   ASSESSMENT AND PLAN: 1. Mastalgia in female -Exam benign. Recommend ibuprofen or Advil over-the-counter as needed. - MM Digital Screening; Future  2. NAFLD (nonalcoholic fatty liver disease) -Diagnosis discussed -Discussed importance of weight loss through dietary changes and regular exercise at least 3-4 times a week for 40 minutes - Comprehensive metabolic panel  3. Acquired hypothyroidism - TSH  4. Class 1 obesity due to excess calories without serious comorbidity with body mass index (BMI) of 30.0 to 30.9 in adult -Dietary changes discussed in details. -Patient to cut back on white  carbohydrates.  5. Acute pharyngitis, unspecified etiology -Likely viral etiology. -Recommend gargling with warm salt water.  6. Need for Tdap vaccination - Tdap vaccine greater than or equal to 7yo IM  7. Screening for HIV (human immunodeficiency virus) - HIV antibody   Patient was given the opportunity to ask questions.  Patient verbalized understanding of the plan and was able to repeat key elements of the plan.  Stratus interpreter used during this encounter. Follow-up in 3 months for Pap smear.  Orders Placed This Encounter  Procedures  . MM Digital Screening   . Tdap vaccine greater than or equal to 7yo IM  . Comprehensive metabolic panel  . TSH  . HIV antibody     Requested Prescriptions    No prescriptions requested or ordered in this encounter    Future Appointments Date Time Provider Department Center  02/28/2017 10:00 AM [facility] FINANCIAL COUNSELOR 2 [facility] None    Jonah Blueeborah Davin Muramoto, MD, Jerrel IvoryFACP

## 2017-02-24 ENCOUNTER — Telehealth: Payer: Self-pay

## 2017-02-24 LAB — COMPREHENSIVE METABOLIC PANEL
ALT: 87 IU/L — ABNORMAL HIGH (ref 0–32)
AST: 59 IU/L — ABNORMAL HIGH (ref 0–40)
Albumin/Globulin Ratio: 1.5 (ref 1.2–2.2)
Albumin: 4.5 g/dL (ref 3.5–5.5)
Alkaline Phosphatase: 99 IU/L (ref 39–117)
BUN/Creatinine Ratio: 15 (ref 9–23)
BUN: 10 mg/dL (ref 6–24)
Bilirubin Total: 0.3 mg/dL (ref 0.0–1.2)
CO2: 21 mmol/L (ref 18–29)
Calcium: 9.1 mg/dL (ref 8.7–10.2)
Chloride: 98 mmol/L (ref 96–106)
Creatinine, Ser: 0.67 mg/dL (ref 0.57–1.00)
GFR calc Af Amer: 121 mL/min/{1.73_m2} (ref >59)
GFR calc non Af Amer: 105 mL/min/{1.73_m2} (ref >59)
Globulin, Total: 3 g/dL (ref 1.5–4.5)
Glucose: 134 mg/dL — ABNORMAL HIGH (ref 65–99)
Potassium: 4.2 mmol/L (ref 3.5–5.2)
Sodium: 137 mmol/L (ref 134–144)
Total Protein: 7.5 g/dL (ref 6.0–8.5)

## 2017-02-24 LAB — HIV ANTIBODY (ROUTINE TESTING W REFLEX): HIV Screen 4th Generation wRfx: NONREACTIVE

## 2017-02-24 LAB — TSH: TSH: 4.27 u[IU]/mL (ref 0.450–4.500)

## 2017-02-24 NOTE — Telephone Encounter (Signed)
Clld pt -   Used MetLife[employer] Interpreters Juan ID# [id number]  [facility] results.

## 2017-02-24 NOTE — Telephone Encounter (Signed)
-----   Message from Deborah B Johnson, MD sent at 02/24/2017  1:29 PM EDT ----- Please let patient know that her liver function tests were still elevated but have decreased compared to one year ago. She should continue to work on improving her eating habits and exercising regularly as discussed yesterday.  Her thyroid level was normal. HIV test was negative.  Kidney function was normal. 

## 2017-02-28 ENCOUNTER — Ambulatory Visit: Payer: Self-pay | Attending: Internal Medicine

## 2017-02-28 ENCOUNTER — Telehealth: Payer: Self-pay

## 2017-02-28 NOTE — Telephone Encounter (Signed)
-----   Message from Marcine Matareborah B Johnson, MD sent at 02/24/2017  1:29 PM EDT ----- Please let patient know that her liver function tests were still elevated but have decreased compared to one year ago. She should continue to work on improving her eating habits and exercising regularly as discussed yesterday.  Her thyroid level was normal. HIV test was negative.  Kidney function was normal.

## 2017-02-28 NOTE — Telephone Encounter (Signed)
Clld pt  Used [employer] Byrd HesselbachMaria ID# [id number]  Advsd of lab results; provider's recommendations. Pt stated she understood and had no other questions at this time.

## 2017-09-18 ENCOUNTER — Ambulatory Visit: Payer: Self-pay

## 2017-09-22 ENCOUNTER — Other Ambulatory Visit (HOSPITAL_COMMUNITY): Payer: Self-pay | Admitting: *Deleted

## 2017-09-22 DIAGNOSIS — N644 Mastodynia: Secondary | ICD-10-CM

## 2017-10-05 ENCOUNTER — Ambulatory Visit: Payer: Self-pay | Attending: Internal Medicine

## 2017-10-23 ENCOUNTER — Ambulatory Visit: Payer: Self-pay | Attending: Internal Medicine | Admitting: Internal Medicine

## 2017-10-23 ENCOUNTER — Encounter: Payer: Self-pay | Admitting: Internal Medicine

## 2017-10-23 VITALS — BP 122/90 | HR 72 | Temp 98.1°F | Resp 16 | Wt 193.0 lb

## 2017-10-23 DIAGNOSIS — R030 Elevated blood-pressure reading, without diagnosis of hypertension: Secondary | ICD-10-CM | POA: Insufficient documentation

## 2017-10-23 DIAGNOSIS — K760 Fatty (change of) liver, not elsewhere classified: Secondary | ICD-10-CM | POA: Insufficient documentation

## 2017-10-23 DIAGNOSIS — M545 Low back pain, unspecified: Secondary | ICD-10-CM

## 2017-10-23 DIAGNOSIS — Z23 Encounter for immunization: Secondary | ICD-10-CM | POA: Insufficient documentation

## 2017-10-23 DIAGNOSIS — E669 Obesity, unspecified: Secondary | ICD-10-CM | POA: Insufficient documentation

## 2017-10-23 DIAGNOSIS — Z6830 Body mass index (BMI) 30.0-30.9, adult: Secondary | ICD-10-CM | POA: Insufficient documentation

## 2017-10-23 MED ORDER — METHOCARBAMOL 750 MG PO TABS: 750.0000 mg | ORAL_TABLET | Freq: Three times a day (TID) | ORAL | 0 refills | Status: DC | PRN

## 2017-10-23 MED ORDER — DICLOFENAC SODIUM 1 % TD GEL: 2.0000 g | Freq: Four times a day (QID) | TRANSDERMAL | 0 refills | Status: DC

## 2017-10-23 NOTE — Progress Notes (Signed)
Pt states the discomfort in her stomach comes and goes  Pt states her lower back has been giver her issues to where she can't sit for a longtime

## 2017-10-23 NOTE — Progress Notes (Signed)
Patient ID: Linda Delgado, female    DOB: 1970-11-11  MRN: [id number]  CC: Abdominal Pain and Back Pain   Subjective: Linda Delgado is a 47 y.o. female who presents for chronic ds management. Her concerns today include:  Pt with hx of NAFLD, obesity  1.  Pt c/o pain in lower back intermittently x 2 wks -points to midline of upper lumbar spine. -last 2-3 days. -"it immobilizes me.  Its a very strong pain, I can't get up from bed." -radiates to LT buttock.  No numbness or tingling but "a poking." -no initiating factors identified Better when she gets up and starts moving around. Worse with laying and sitting.  Currently unemployed -takes Ibuprofen which reduces the pain -no dysuria, vaginal dischg.  Currently on menses. Marland Kitchen.  HM: due for influence and PAP.  Will have pap done 11/02/2017 through [facility]   Patient Active Problem List   Diagnosis Date Noted  . Mastalgia in female 02/23/2017  . NAFLD (nonalcoholic fatty liver disease) 64/40/347405/17/2018  . Class 1 obesity due to excess calories without serious comorbidity with body mass index (BMI) of 30.0 to 30.9 in adult 02/23/2017  . Thyroid activity decreased 11/13/2014  . Menorrhagia 04/02/2014     Current Outpatient Medications on File Prior to Visit  Medication Sig Dispense Refill  . ibuprofen (ADVIL,MOTRIN) 600 MG tablet Take 1 tablet (600 mg total) by mouth every 6 (six) hours as needed. 30 tablet 1  . ferrous sulfate 325 (65 FE) MG tablet Take 325 mg by mouth daily with breakfast.    . Multiple Vitamin (MULTIVITAMIN) tablet Take 1 tablet by mouth daily.    Marland Kitchen. omega-3 acid ethyl esters (LOVAZA) 1 G capsule Take by mouth 2 (two) times daily.     No current facility-administered medications on file prior to visit.     No Known Allergies  Social History   Socioeconomic History  . Marital status: Married    Spouse name: Not on file  . Number of children: Not on file  . Years of education: Not on file  .  Highest education level: Not on file  Social Needs  . Financial resource strain: Not on file  . Food insecurity - worry: Not on file  . Food insecurity - inability: Not on file  . Transportation needs - medical: Not on file  . Transportation needs - non-medical: Not on file  Occupational History  . Not on file  Tobacco Use  . Smoking status: Never Smoker  . Smokeless tobacco: Never Used  Substance and Sexual Activity  . Alcohol use: No  . Drug use: No  . Sexual activity: Yes    Partners: Male    Birth control/protection: None  Other Topics Concern  . Not on file  Social History Narrative  . Not on file    Family History  Problem Relation Age of Onset  . Diabetes Father     Past Surgical History:  Procedure Laterality Date  . CESAREAN SECTION  1997, 1999, 2007  . WISDOM TOOTH EXTRACTION      ROS: Review of Systems Neg except as above.  PHYSICAL EXAM: BP 122/90   Pulse 72   Temp 98.1 F (36.7 C) (Oral)   Resp 16   Wt 193 lb (87.5 kg)   SpO2 96%   BMI 30.23 kg/m   Wt Readings from Last 3 Encounters:  10/23/17 193 lb (87.5 kg)  02/23/17 192 lb 9.6 oz (87.4 kg)  03/10/15 185  lb 6.4 oz (84.1 kg)    Physical Exam  General appearance - alert, well appearing, and in no distress Mental status - alert, oriented to person, place, and time, normal mood, behavior, speech, dress, motor activity, and thought processes Neck - supple, no significant adenopathy Neurological - normal muscle tone, no tremors, strength 5/5.  Straight leg raise neg Musculoskeletal - mild tenderness on palpation over LT SI jt  ASSESSMENT AND PLAN: 1. Acute midline low back pain without sciatica -MSK in nature Back exercises encouraged. - diclofenac sodium (VOLTAREN) 1 % GEL; Apply 2 g topically 4 (four) times daily.  Dispense: 100 g; Refill: 0 - methocarbamol (ROBAXIN-750) 750 MG tablet; Take 1 tablet (750 mg total) by mouth every 8 (eight) hours as needed for muscle spasms.  Dispense: 20  tablet; Refill: 0  2. Need for influenza vaccination - Flu Vaccine QUAD 6+ mos PF IM (Fluarix Quad PF)  3. Elevated blood pressure reading Advise to limit salt in foods.  F/u in 1 mth with clin pharmacist for repeat BP  Patient was given the opportunity to ask questions.  Patient verbalized understanding of the plan and was able to repeat key elements of the plan.  [employer] interpreter ,[phone], used during this encounter.  Orders Placed This Encounter  Procedures  . Flu Vaccine QUAD 6+ mos PF IM (Fluarix Quad PF)     Requested Prescriptions   Signed Prescriptions Disp Refills  . diclofenac sodium (VOLTAREN) 1 % GEL 100 g 0    Sig: Apply 2 g topically 4 (four) times daily.  . methocarbamol (ROBAXIN-750) 750 MG tablet 20 tablet 0    Sig: Take 1 tablet (750 mg total) by mouth every 8 (eight) hours as needed for muscle spasms.    Return if symptoms worsen or fail to improve.  Jonah Blue, MD, FACP

## 2017-10-23 NOTE — Patient Instructions (Addendum)
Please schedule a BP check with Stacy in 1 mth  Ejercicios para la espalda (Back Exercises) Si tiene dolor de espalda, haga estos ejercicios 2 o 3veces por da, o como se lo haya indicado el mdico. Cuando el dolor desaparezca, hgalos una vez por da, pero haga ms repeticiones de cada ejercicio. Si no le duele la espalda, haga estos ejercicios una vez por da o como se lo haya indicado el mdico. EJERCICIOS Rodilla al pecho Repita estos pasos 3 o 5veces seguidas con cada pierna: 1. Acustese boca arriba sobre una cama dura o sobre el suelo con las piernas extendidas. 2. Lleve una rodilla al pecho. 3. Mantenga la rodilla contra el pecho. Para lograrlo tmese la rodilla o el muslo. 4. Tire de la rodilla hasta sentir una elongacin suave en la parte baja de la espalda. 5. Mantenga la elongacin durante 10 a 30segundos. 6. Suelte y extienda la pierna lentamente. Inclinacin de la pelvis Repita estos pasos 5 o 10veces seguidas: 1. Acustese boca arriba sobre una cama dura o sobre el suelo con las piernas extendidas. 2. Flexione las rodillas de manera que apunten al techo. Los pies deben estar apoyados en el suelo. 3. Contraiga los msculos de la parte baja del vientre (abdomen) para empujar la zona lumbar contra el suelo. Este movimiento har que el cccix apunte hacia el techo, en lugar de apuntar hacia abajo en direccin a los pies o al suelo. 4. Mantenga esta posicin durante 5 a 10segundos mientras contrae suavemente los msculos y respira con normalidad. El perro y el gato Repita estos pasos hasta que la zona lumbar se curve con ms facilidad: 1. Apoye las palmas de las manos y las rodillas sobre una superficie firme. Las manos deben estar alineadas con los hombros y las rodillas con las caderas. Puede colocarse almohadillas debajo de las rodillas. 2. Deje caer la cabeza y lleve el cccix hacia abajo de modo que apunte en direccin al suelo para que la zona lumbar se arquee como el lomo  de un gato [place]. 3. Mantenga esta posicin durante 5segundos. 4. Lentamente, levante la cabeza y lleve el cccix hacia arriba de modo que apunte en direccin al techo para que la espalda se arquee (hunda) como el lomo de un perro contento. 5. Mantenga esta posicin durante 5segundos. Flexiones de brazos Repita estos pasos 5 o 10veces seguidas: 1. Acustese boca abajo en el suelo. 2. Ponga las manos cerca de la cabeza, separadas aproximadamente al ancho de los hombros. 3. Con la espalda relajada y las caderas apoyadas en el suelo, extienda lentamente los brazos para levantar la mitad superior del cuerpo y Optometrist los hombros. No use los msculos de la espalda. Para estar ms cmodo, puede cambiar la [employer]. 4. Mantenga esta posicin durante 5segundos. 5. Lentamente vuelva a la posicin horizontal. Puentes Repita estos pasos 10veces seguidas: 1. Acustese boca arriba sobre una superficie firme. 2. Flexione las rodillas de manera que apunten al techo. Los pies deben estar apoyados en el suelo. 3. Contraiga los glteos y despegue las nalgas del suelo hasta que la cintura est casi a la altura de las rodillas. Si no siente el trabajo muscular en las nalgas y la parte posterior de los muslos, aleje los pies 1 o 2pulgadas (2,5 o 5centmetros) de las nalgas. 4. Mantenga esta posicin durante 3 a 5segundos. 5. Lentamente, vuelva a apoyar las nalgas en el suelo y relaje los glteos. Si este ejercicio le resulta muy fcil, intente realizarlo con los  brazos cruzados Dealersobre el pecho. Abdominales Repita estos pasos 5 o 10veces seguidas: 1. Acustese boca arriba sobre una cama dura o sobre el suelo con las piernas extendidas. 2. Flexione las rodillas de manera que apunten al techo. Los pies deben estar apoyados en el suelo. 3. Cruce los World Fuel Services Corporationbrazos sobre el pecho. 4. Baje levemente el mentn en direccin al pecho, pero no doble el cuello. 5. Contraiga los msculos del abdomen y con  lentitud eleve el pecho lo suficiente como para despegar levemente los omplatos del suelo. 6. Lentamente baje el pecho y la cabeza hasta el suelo. Elevaciones de espalda Repita estos pasos 5 o 10veces seguidas: 1. Acustese boca abajo con los brazos a los costados y apoye la frente en el suelo. 2. Contraiga los msculos de las piernas y los glteos. 3. Lentamente despegue el pecho del suelo mientras mantiene las caderas apoyadas en el suelo. Mantenga la nuca alineada con la curvatura de la espalda. Mire hacia el suelo mientras hace este ejercicio. 4. Mantenga esta posicin durante 3 a 5segundos. 5. Lentamente baje el pecho y el rostro hasta el suelo. SOLICITE AYUDA SI:  El dolor de espalda se vuelve mucho ms intenso cuando hace un ejercicio.  El dolor de espalda no se [place] 2horas despus de ARAMARK Corporationhacer los ejercicios. Si tiene alguno de Limited Brandsestos problemas, deje de ARAMARK Corporationhacer los ejercicios. No vuelva a hacer los ejercicios a menos que el mdico lo autorice. SOLICITE AYUDA DE INMEDIATO SI:  Siente un dolor sbito y muy intenso en la espalda. Si esto ocurre, deje de Toys 'R' Ushacer los ejercicios. No vuelva a hacer los ejercicios a menos que el mdico lo autorice. Esta informacin no tiene Theme park managercomo fin reemplazar el consejo del mdico. Asegrese de hacerle al mdico cualquier pregunta que tenga. Document Released: 01/11/2011 Document Revised: 01/18/2016 Document Reviewed: 11/20/2014 Elsevier Interactive Patient Education  2018 [employer]vier Inc.  Influenza Virus Vaccine injection (Fluarix) Qu es este medicamento? La VACUNA ANTIGRIPAL ayuda a disminuir el riesgo de contraer la influenza, tambin conocida como la gripe. La vacuna solo ayuda a protegerle contra algunas cepas de influenza. Esta vacuna no ayuda a reducir Nurse, adultel riesgo de contraer influenza pandmica H1N1. Este medicamento puede ser utilizado para otros usos; si tiene alguna pregunta consulte con su proveedor de atencin mdica o con su farmacutico. MARCAS  COMUNES: Fluarix, Fluzone Qu le debo informar a mi profesional de la salud antes de tomar este medicamento? Necesita saber si usted presenta alguno de los siguientes problemas o situaciones: -trastorno de sangrado como hemofilia -fiebre o infeccin -sndrome de Guillain-Barre u otros problemas neurolgicos -problemas del sistema inmunolgico -infeccin por el virus de la inmunodeficiencia humana (VIH) o SIDA -niveles bajos de plaquetas en la sangre -esclerosis mltiple -una Automotive engineerreaccin alrgica o inusual a las vacunas antigripales, a los huevos, protenas de pollo, al ltex, a la gentamicina, a otros medicamentos, alimentos, colorantes o conservantes -si est embarazada o buscando quedar embarazada -si est amamantando a un beb Cmo debo utilizar este medicamento? Esta vacuna se administra mediante inyeccin por va intramuscular. Lo administra un profesional de Beazer Homesla salud. Recibir una copia de informacin escrita sobre la vacuna antes de cada vacuna. Asegrese de leer este folleto cada vez cuidadosamente. Este folleto puede cambiar con frecuencia. Hable con su pediatra para informarse acerca del uso de este medicamento en nios. Puede requerir atencin especial. Sobredosis: Pngase en contacto inmediatamente con un centro toxicolgico o una sala de urgencia si usted cree que haya tomado demasiado medicamento. ATENCIN: Reynolds AmericanEste medicamento es solo para usted.  No comparta este medicamento con nadie. Qu sucede si me olvido de una dosis? No se aplica en este caso. Qu puede interactuar con este medicamento? -quimioterapia o radioterapia -medicamentos que suprimen el sistema inmunolgico, tales como etanercept, anakinra, infliximab y adalimumab -medicamentos que tratan o previenen cogulos sanguneos, como warfarina -fenitona -medicamentos esteroideos, como la prednisona o la cortisona -teofilina -vacunas Puede ser que esta lista no menciona todas las posibles interacciones. Informe a su  profesional de Beazer Homes de In[employer] Inc productos a base de hierbas, medicamentos de [place] o suplementos nutritivos que est tomando. Si usted fuma, consume bebidas alcohlicas o si utiliza drogas ilegales, indqueselo tambin a su profesional de [employer] Homes. Algunas sustancias pueden interactuar con su medicamento. A qu debo estar atento al usar PPL Corporation? Informe a su mdico o a Producer, television/film/video de la [employer] todos los efectos secundarios que persistan despus de [address]. Llame a su proveedor de atencin mdica si se presentan sntomas inusuales dentro de las 6 semanas posteriores a la vacunacin. Es posible que todava pueda contraer la gripe, pero la enfermedad no ser tan fuerte como normalmente. No puede contraer la gripe de esta vacuna. La vacuna antigripal no le protege contra resfros u otras enfermedades que pueden causar [place]. Debe vacunarse cada ao. Qu efectos secundarios puedo tener al [employer] este medicamento? Efectos secundarios que debe informar a su mdico o a Producer, television/film/video de la salud tan pronto como sea posible: -reacciones alrgicas como erupcin cutnea, picazn o urticarias, hinchazn de la cara, labios o lengua Efectos secundarios que, por lo general, no requieren atencin mdica (debe informarlos a su mdico o a su profesional de la salud si persisten o si son molestos): -fiebre -dolor de cabeza -molestias y dolores musculares -dolor, sensibilidad, enrojecimiento o Paramedic de la inyeccin -cansancio o debilidad Puede ser que esta lista no menciona todos los posibles efectos secundarios. Comunquese a su mdico por asesoramiento mdico [employer]lett[employer]. Usted puede informar los efectos secundarios a la FDA por telfono al 1-800-FDA-1088. Dnde debo guardar mi medicina? Esta vacuna se administra solamente en clnicas, farmacias, consultorio mdico u otro consultorio de un profesional de la salud y no Teacher, early years/pre en su  domicilio. ATENCIN: Este folleto es un resumen. Puede ser que no cubra toda la posible informacin. Si usted tiene preguntas acerca de esta medicina, consulte con su mdico, su farmacutico o su profesional de Radiographer, therapeutic.  2018 Elsevier/Gold Standard (2010-03-30 15:31:40)

## 2017-11-02 ENCOUNTER — Other Ambulatory Visit: Payer: Self-pay | Admitting: Obstetrics and Gynecology

## 2017-11-02 ENCOUNTER — Ambulatory Visit (HOSPITAL_COMMUNITY)
Admission: RE | Admit: 2017-11-02 | Discharge: 2017-11-02 | Disposition: A | Discharge status: Home / Self Care | Payer: Self-pay | Source: Ambulatory Visit | Attending: Obstetrics and Gynecology | Admitting: Obstetrics and Gynecology

## 2017-11-02 ENCOUNTER — Encounter (HOSPITAL_COMMUNITY): Payer: Self-pay

## 2017-11-02 ENCOUNTER — Other Ambulatory Visit (HOSPITAL_COMMUNITY): Payer: Self-pay | Admitting: Obstetrics and Gynecology

## 2017-11-02 ENCOUNTER — Ambulatory Visit
Admission: RE | Admit: 2017-11-02 | Discharge: 2017-11-02 | Disposition: A | Discharge status: Home / Self Care | Payer: No Typology Code available for payment source | Source: Ambulatory Visit | Attending: Obstetrics and Gynecology | Admitting: Obstetrics and Gynecology

## 2017-11-02 VITALS — BP 133/80 | Wt 191.4 lb

## 2017-11-02 DIAGNOSIS — N6310 Unspecified lump in the right breast, unspecified quadrant: Secondary | ICD-10-CM

## 2017-11-02 DIAGNOSIS — N644 Mastodynia: Secondary | ICD-10-CM

## 2017-11-02 DIAGNOSIS — Z01419 Encounter for gynecological examination (general) (routine) without abnormal findings: Secondary | ICD-10-CM

## 2017-11-02 NOTE — Progress Notes (Signed)
Complaints of left outer breast pain that comes and goes for past four years. Patient rates the pain at a 5 out of 10.  Pap Smear: Pap smear completed today. Last Pap smear was around 2015 and normal per patient. Per patient has no history of an abnormal Pap smear. No Pap smear results are in Epic.  Physical exam: Breasts Breasts symmetrical. No skin abnormalities bilateral breasts. No nipple retraction bilateral breasts. No nipple discharge bilateral breasts. No lymphadenopathy. No lumps palpated bilateral breasts. No complaints of pain or tenderness on exam. [facility] and possible left breast ultrasound. Appointment scheduled for Thursday, November 02, 2017 at 1120.  Pelvic/Bimanual   Ext Genitalia No lesions, no swelling and no discharge observed on external genitalia.         Vagina Vagina pink and normal texture. No lesions or discharge observed in vagina.          Cervix Cervix is present. Cervix pink and of normal texture. No discharge observed.     Uterus Uterus is present and palpable. Uterus in normal position and normal size.        Adnexae Bilateral ovaries present and palpable. No tenderness on palpation.          Rectovaginal No rectal exam completed today since patient had no rectal complaints. No skin abnormalities observed on exam.    Smoking History: Patient has never smoked.  Patient Navigation: Patient education provided. [facility]. Spanish interpreter provided.  Used Spanish interpreter Cel[employer] CorporationErika McReynolds from [place].

## 2017-11-02 NOTE — Patient Instructions (Signed)
Explained breast self awareness with Linda Delgado. Let patient know [facility] will cover Pap smears and HPV typing every 5 years unless has a history of abnormal Pap smears.[facility] and possible left breast ultrasound. Appointment scheduled for Thursday, November 02, 2017 at 1120. Let patient know will follow up with her within the next couple weeks with results of Pap smear by letter or phone. Linda Delgado verbalized understanding.  Koleman Marling, Kathaleen Maserhristine Poll, RN 2:03 PM

## 2017-11-03 ENCOUNTER — Encounter (HOSPITAL_COMMUNITY): Payer: Self-pay | Admitting: *Deleted

## 2017-11-03 LAB — CYTOLOGY - PAP
Diagnosis: NEGATIVE
HPV: NOT DETECTED

## 2017-11-21 ENCOUNTER — Encounter (HOSPITAL_COMMUNITY): Payer: Self-pay | Admitting: *Deleted

## 2017-11-21 ENCOUNTER — Ambulatory Visit: Payer: No Typology Code available for payment source | Admitting: Internal Medicine

## 2017-11-21 NOTE — Progress Notes (Signed)
Letter mailed to patient with negative pap smear results. Next pap smear due in five years.  

## 2017-12-18 ENCOUNTER — Encounter (HOSPITAL_COMMUNITY): Payer: Self-pay | Admitting: *Deleted

## 2017-12-18 NOTE — Progress Notes (Signed)
Letter mailed to patient with negative pap smear results.  

## 2018-03-14 ENCOUNTER — Ambulatory Visit: Payer: Self-pay | Attending: Family Medicine | Admitting: Physician Assistant

## 2018-03-14 ENCOUNTER — Other Ambulatory Visit: Payer: Self-pay | Admitting: Physician Assistant

## 2018-03-14 VITALS — BP 123/77 | HR 74 | Temp 98.0°F | Resp 18 | Ht 64.0 in | Wt 179.0 lb

## 2018-03-14 DIAGNOSIS — R81 Glycosuria: Secondary | ICD-10-CM | POA: Insufficient documentation

## 2018-03-14 DIAGNOSIS — R300 Dysuria: Secondary | ICD-10-CM | POA: Insufficient documentation

## 2018-03-14 DIAGNOSIS — E1165 Type 2 diabetes mellitus with hyperglycemia: Secondary | ICD-10-CM | POA: Insufficient documentation

## 2018-03-14 DIAGNOSIS — E079 Disorder of thyroid, unspecified: Secondary | ICD-10-CM | POA: Insufficient documentation

## 2018-03-14 DIAGNOSIS — Z79899 Other long term (current) drug therapy: Secondary | ICD-10-CM | POA: Insufficient documentation

## 2018-03-14 DIAGNOSIS — Z789 Other specified health status: Secondary | ICD-10-CM

## 2018-03-14 DIAGNOSIS — Z7984 Long term (current) use of oral hypoglycemic drugs: Secondary | ICD-10-CM | POA: Insufficient documentation

## 2018-03-14 DIAGNOSIS — N920 Excessive and frequent menstruation with regular cycle: Secondary | ICD-10-CM | POA: Insufficient documentation

## 2018-03-14 DIAGNOSIS — R1030 Lower abdominal pain, unspecified: Secondary | ICD-10-CM | POA: Insufficient documentation

## 2018-03-14 DIAGNOSIS — N3001 Acute cystitis with hematuria: Secondary | ICD-10-CM | POA: Insufficient documentation

## 2018-03-14 LAB — POCT URINALYSIS DIPSTICK
Bilirubin, UA: NEGATIVE
Glucose, UA: POSITIVE — AB
Ketones, UA: 15
Nitrite, UA: NEGATIVE
Protein, UA: POSITIVE — AB
Spec Grav, UA: 1.025 (ref 1.010–1.025)
Urobilinogen, UA: 0.2 E.U./dL
pH, UA: 5 (ref 5.0–8.0)

## 2018-03-14 LAB — POCT GLYCOSYLATED HEMOGLOBIN (HGB A1C): HbA1c, POC (controlled diabetic range): 11.7 % — AB (ref 0.0–7.0)

## 2018-03-14 LAB — GLUCOSE, POCT (MANUAL RESULT ENTRY)
POC Glucose: 226 mg/dl — AB (ref 70–99)
POC Glucose: 229 mg/dl — AB (ref 70–99)

## 2018-03-14 MED ORDER — GLUCOSE BLOOD VI STRP: ORAL_STRIP | 12 refills | Status: DC

## 2018-03-14 MED ORDER — NITROFURANTOIN MONOHYD MACRO 100 MG PO CAPS: 100.0000 mg | ORAL_CAPSULE | Freq: Two times a day (BID) | ORAL | 0 refills | Status: DC

## 2018-03-14 MED ORDER — TRUE METRIX METER W/DEVICE KIT: 1.0000 | PACK | Freq: Three times a day (TID) | 0 refills | Status: AC

## 2018-03-14 MED ORDER — INSULIN ASPART 100 UNIT/ML ~~LOC~~ SOLN
10.0000 [IU] | Freq: Once | SUBCUTANEOUS | Status: AC
  Administered 2018-03-14: 10 [IU] via SUBCUTANEOUS

## 2018-03-14 MED ORDER — BD ULTRA-FINE LANCETS MISC: 12 refills | Status: DC

## 2018-03-14 MED ORDER — FLUCONAZOLE 150 MG PO TABS: 150.0000 mg | ORAL_TABLET | Freq: Once | ORAL | 0 refills | Status: AC

## 2018-03-14 MED ORDER — TRUEPLUS LANCETS 30G MISC: 2 refills | Status: DC

## 2018-03-14 MED ORDER — METFORMIN HCL 500 MG PO TABS: 500.0000 mg | ORAL_TABLET | Freq: Two times a day (BID) | ORAL | 3 refills | Status: DC

## 2018-03-14 MED FILL — TRUE METRIX TEST STRIP: 25 days supply | Qty: 100 | Fill #0

## 2018-03-14 MED FILL — metFORMIN HCL 500 MG TABS: 500 | 30 days supply | Qty: 120 | Fill #0

## 2018-03-14 MED FILL — TRUEplus LANCETS 28G MISC: 25 days supply | Qty: 100 | Fill #0

## 2018-03-14 MED FILL — !TRUE METRIX BLOOD GLUCOSE: 365 days supply | Qty: 1 | Fill #0

## 2018-03-14 MED FILL — FLUCONAZOLE 150 MG TABS: 150 | 1 days supply | Qty: 1 | Fill #0

## 2018-03-14 MED FILL — NITROFURANTOIN MONO-MCR 100: 100 | 3 days supply | Qty: 6 | Fill #0

## 2018-03-14 NOTE — Progress Notes (Signed)
Linda Delgado, is a 47 y.o. female  [id number]  [id number]  DOB - November 27, 1970  Subjective:  Chief Complaint and HPI: Linda Delgado is a 47 y.o. female here today with 5 day h/o dysuria and frequency with mild lower abdominal pain. No f/c.  No h/o diabetes but urine showed glucose, so I added a RBG and A1C which were positive for diabetes. Denies s/sx.    Homero with [employer] interpreters.    +FH diabetes.   ROS:   Constitutional:  No f/c, No night sweats, No unexplained weight loss. EENT:  No vision changes, No blurry vision, No hearing changes. No mouth, throat, or ear problems.  Respiratory: No cough, No SOB Cardiac: No CP, no palpitations GI:  No abd pain, No N/V/D. GU: No Urinary s/sx Musculoskeletal: No joint pain Neuro: No headache, no dizziness, no motor weakness.  Skin: No rash Endocrine:  No polydipsia. No polyuria.  Psych: Denies SI/HI  No problems updated.  ALLERGIES: No Known Allergies  PAST MEDICAL HISTORY: Past Medical History:  Diagnosis Date  . Menorrhagia   . Thyroid disease     MEDICATIONS AT HOME: Prior to Admission medications   Medication Sig Start Date End Date Taking? Authorizing Provider  ferrous sulfate 325 (65 FE) MG tablet Take 325 mg by mouth daily with breakfast.   Yes [provider]  fluconazole (DIFLUCAN) 150 MG tablet Take 1 tablet (150 mg total) by mouth once for 1 dose. 03/14/18 03/14/18 Yes Brittanie Dosanjh, Marzella SchleinAngela M, PA-C  metFORMIN (GLUCOPHAGE) 500 MG tablet Take 1 tablet (500 mg total) by mouth 2 (two) times daily with a meal. Then increase to 2 tabs twice daily in 1 week 03/14/18  Yes Pierrette Scheu M, PA-C  nitrofurantoin, macrocrystal-monohydrate, (MACROBID) 100 MG capsule Take 1 capsule (100 mg total) by mouth 2 (two) times daily. 03/14/18  Yes Anders SimmondsMcClung, Macall Mccroskey M, PA-C  diclofenac sodium (VOLTAREN) 1 % GEL Apply 2 g topically 4 (four) times daily. Patient not taking: Reported on 03/14/2018 10/23/17    Marcine MatarJohnson, Deborah B, MD  methocarbamol (ROBAXIN-750) 750 MG tablet Take 1 tablet (750 mg total) by mouth every 8 (eight) hours as needed for muscle spasms. 10/23/17   Marcine MatarJohnson, Deborah B, MD  Multiple Vitamin (MULTIVITAMIN) tablet Take 1 tablet by mouth daily.    [provider]  omega-3 acid ethyl esters (LOVAZA) 1 G capsule Take by mouth 2 (two) times daily.    [provider]     Objective:  EXAM:   Vitals:   03/14/18 1143  BP: 123/77  Pulse: 74  Resp: 18  Temp: 98 F (36.7 C)  TempSrc: Oral  SpO2: 99%  Weight: 179 lb (81.2 kg)  Height: 5\' 4"  (1.626 m)    General appearance : A&OX3. NAD. Non-toxic-appearing HEENT: Atraumatic and Normocephalic.  PERRLA. EOM intact.  TM clear B. Mouth-MMM, post pharynx WNL w/o erythema, No PND. Neck: supple, no JVD. No cervical lymphadenopathy. No thyromegaly Chest/Lungs:  Breathing-non-labored, Good air entry bilaterally, breath sounds normal without rales, rhonchi, or wheezing  CVS: S1 S2 regular, no murmurs, gallops, rubs  Abdomen: Bowel sounds present, Non tender and not distended with no gaurding, rigidity or rebound. Extremities: Bilateral Lower Ext shows no edema, both legs are warm to touch with = pulse throughout Neurology:  CN II-XII grossly intact, Non focal.   Psych:  TP linear. J/I WNL. Normal speech. Appropriate eye contact and affect.  Skin:  No Rash  Data Review Lab Results  Component Value Date  HGBA1C 11.7 (A) 03/14/2018   HGBA1C 5.5 11/13/2014   HGBA1C 5.1 02/10/2014     Assessment & Plan   1. Acute cystitis with hematuria - Urine Culture - Urine cytology ancillary only - nitrofurantoin, macrocrystal-monohydrate, (MACROBID) 100 MG capsule; Take 1 capsule (100 mg total) by mouth 2 (two) times daily.  Dispense: 6 capsule; Refill: 0 - fluconazole (DIFLUCAN) 150 MG tablet; Take 1 tablet (150 mg total) by mouth once for 1 dose.  Dispense: 1 tablet; Refill: 0  2. Dysuria Due to #1 - Urinalysis  Dipstick  3. Glucosuria Prompted CBG and A1C - Glucose (CBG)  4. Type 2 diabetes mellitus with hyperglycemia, without long-term current use of insulin (HCC)-new onset. Patient education provided at length.  Spent >30 mins face to face with patient education on new diagnosis/diet/exercsise, medications and instructions for checking blood sugar.   Glucometer supplies ordered.  20 ounces water given po stat - HgB A1c - metFORMIN (GLUCOPHAGE) 500 MG tablet; Take 1 tablet (500 mg total) by mouth 2 (two) times daily with a meal. Then increase to 2 tabs twice daily in 1 week  Dispense: 180 tablet; Refill: 3 - insulin aspart (novoLOG) injection 10 Units - Comprehensive metabolic panel If blood sugars >350 and /or symptomatic-go to the UC or ED Check blood sugars daily fasting and bring to next visit.    5. Language barrier stratus interpreters used and additional time performing visit was required.      Patient have been counseled extensively about nutrition and exercise  Return in about 3 weeks (around 04/04/2018) for Dr Laural Benes f/up newly diagnosed diabetes.  The patient was given clear instructions to go to ER or return to medical center if symptoms don't improve, worsen or new problems develop. The patient verbalized understanding. The patient was told to call to get lab results if they haven't heard anything in the next week.     Georgian Co, PA-C [facility] [place], [place] [phone]   03/14/2018, 11:48 AM

## 2018-03-14 NOTE — Patient Instructions (Addendum)
Drink 80 ounces water daily.  Cut down on sugars. Check blood sugar fasting daily and record and bring to your next visit.   Diabetes mellitus y [place] fsica (Diabetes Mellitus and Exercise) Hacer actividad fsica habitualmente es importante para el estado de salud general, en especial si tiene diabetes (diabetes mellitus). La actividad fsica no solo se reduce a Psychiatric nurse. Aporta muchos beneficios para la salud, como aumentar la fuerza muscular y la densidad sea, y reducir las grasas corporales y Development worker, community. Esto mejora el estado fsico, la flexibilidad y la resistencia, y todo ello redunda en un mejor estado de salud general. La actividad fsica tiene beneficios adicionales para los diabticos, entre ellos:  Disminuye el apetito.  Ayuda a bajar y Photographer glucemia bajo control.  [place] la presin arterial.  Ayuda a controlar las cantidades de sustancias grasas (lpidos) en la [place], como el colesterol y los triglicridos.  Mejora la respuesta del cuerpo a la insulina (optimizacin de la sensibilidad a la insulina).  Reduce la cantidad de insulina que el cuerpo necesita.  Reduce el riesgo de sufrir cardiopata coronaria de la siguiente forma: ? [employer] de colesterol y triglicridos. ? Aumenta los niveles de colesterol bueno. ? Disminuye la glucemia. QU ES EL PLAN DE ACTIVIDADES? El mdico o un educador para la diabetes certificado pueden ayudarlo a Teacher, English as a foreign language del tipo y de la frecuencia de actividad fsica (plan de actividades) adecuado para usted. Asegrese de lo siguiente:  Haga por lo menos semanales de ejercicios de intensidad moderada o vigorosa. Estos podran ser caminatas dinmicas, ciclismo o [place]. ? Haga ejercicios de elongacin y de fortalecimiento, como yoga o levantamiento de pesas, por lo menos 2veces por semana. ? Reparta la actividad en al menos 3das de la semana.  Haga algn tipo de actividad fsica [employer]. ? No deje pasar ms de 2das seguidos sin hacer algn tipo de actividad fsica. ? No permanezca inactivo durante ms de seguidos. Tmese descansos frecuentes para caminar o estirarse.  Elija un tipo de ejercicio o de actividad que disfrute y establezca objetivos realistas.  Comience lentamente y aumente de [place] gradual la intensidad del ejercicio con el correr del [place]. QU DEBO SABER ACERCA DEL CONTROL DE LA DIABETES?  Contrlese la glucemia antes y despus de ejercitarse. ? Si la glucemia supera los 240mg /dl (16,1WRUE/A) antes de ejercitarse, hgase un control de la orina para detectar la presencia de cetonas. Si tiene [employer] orina, no se ejercite hasta que la glucemia se normalice.  Conozca los sntomas de la glucemia baja (hipoglucemia) y aprenda cmo tratarla. El riesgo de tener hipoglucemia [place] durante y despus de hacer actividad fsica. Los sntomas frecuentes de hipoglucemia pueden incluir los siguientes: ? Hambre. ? Ansiedad. ? Sudoracin y [employer] ? Confusin. ? Mareos o sensacin de desvanecimiento. ? Aumento de la frecuencia cardaca o palpitaciones. ? Visin borrosa. ? Hormigueo o adormecimiento alrededor [employer], los labios o la [place]. ? Temblores o sacudidas. ? Irritabilidad.  Tenga una colacin de carbohidratos de accin rpida disponible antes, durante y despus de ejercitarse, a fin de evitar o tratar la hipoglucemia.  Evite inyectarse insulina en las zonas del cuerpo que ejercitar. Por ejemplo, evite inyectarse insulina en: ? Los brazos, si juega al tenis. ? Las piernas, si corre.  Lleve registros de sus hbitos de actividad fsica. Esto puede ayudarlos a usted y al mdico a Retail banker de control de la diabetes  segn sea necesario. Escriba los siguientes datos: ? Los alimentos que consume antes y despus de Radio producer actividad fsica. ? Los niveles de glucosa en la sangre antes y despus de hacer ejercicios. ? El  tipo y cantidad de [place] fsica que Biomedical engineer. ? Cuando se prev que la insulina alcance su valor mximo, si [place] insulina. No haga actividad fsica en los momentos en que insulina alcanza su valor mximo.  Cuando comience un ejercicio o una actividad nuevos, trabaje con el mdico para asegurarse de que la actividad sea segura para usted y para Academic librarian la [place], los medicamentos o la ingesta de alimentos segn sea necesario.  Beba gran cantidad de agua mientras hace ejercicios para evitar la deshidratacin o los golpes de Airline pilot. Beba suficiente lquido para Photographer orina clara o de color amarillo plido. Esta informacin no tiene Theme park manager el consejo del mdico. Asegrese de hacerle al mdico cualquier pregunta que tenga. Document Released: 10/16/2007 Document Revised: 01/18/2016 Document Reviewed: 03/07/2016 Elsevier Interactive Patient Education  2018 [employer].  Diabetes mellitus y nutricin Diabetes Mellitus and Nutrition Si sufre de diabetes (diabetes mellitus), es muy importante tener hbitos alimenticios saludables debido a que sus niveles de Psychologist, counselling sangre (glucosa) se ven afectados en gran medida por lo que come y bebe. [place] alimentos saludables en las cantidades [place], aproximadamente a la [employer], [place] ayudar a:  Scientist, physiological glucemia.  Disminuir el riesgo de sufrir una enfermedad cardaca.  Mejorar la presin arterial.  Barista o mantener un peso saludable.  Todas las personas que sufren de diabetes son diferentes y cada una tiene necesidades diferentes en cuanto a un plan de alimentacin. El mdico puede recomendarle que trabaje con un especialista en dietas y nutricin (nutricionista) para Tax adviser plan para usted. Su plan de alimentacin puede variar segn factores como:  Las caloras que necesita.  Los medicamentos que toma.  Su peso.  Sus niveles de glucemia, presin arterial y colesterol.  Su nivel de  [place].  Otras afecciones que tenga, como enfermedades cardacas o renales.  Cmo me afectan los carbohidratos? Los carbohidratos afectan el nivel de glucemia ms que cualquier otro tipo de alimento. La ingesta de carbohidratos naturalmente aumenta la cantidad glucosa en la sangre. El recuento de carbohidratos es un mtodo destinado a Midwife un registro de la cantidad de carbohidratos que se ingieren. El recuento de carbohidratos es importante para Pharmacologist la glucemia a un nivel saludable, en especial si utiliza insulina o toma determinados medicamentos por va oral para la diabetes. Es importante saber la cantidad de carbohidratos que se pueden ingerir en cada comida sin correr Surveyor, minerals. Esto es Government social research officer. El nutricionista puede ayudarlo a calcular la cantidad de carbohidratos que debe ingerir en cada comida y colacin. Los alimentos que contienen carbohidratos incluyen:  Pan, cereal, arroz, pasta y galletas.  Papas y maz.  Guisantes, frijoles y lentejas.  Leche y Dentist.  Nils Pyle y [place].  Postres, como pasteles, galletitas, helado y caramelos.  Cmo me afecta el alcohol? El alcohol puede provocar disminuciones sbitas de la glucemia (hipoglucemia), en especial si utiliza insulina o toma determinados medicamentos por va oral para la diabetes. La hipoglucemia es una afeccin potencialmente mortal. Los sntomas de la hipoglucemia (somnolencia, mareos y confusin) son similares a los sntomas de haber consumido demasiado alcohol. Si el mdico afirma que el alcohol es seguro para usted, siga estas pautas:  Limite el consumo de alcohol a no ms de 1  medida por da si es mujer y no est [place], y a 2 medidas si es hombre. Una medida equivale a 12oz ( ) de cerveza, 5oz ( ) de vino o 1oz (44ml) de bebidas de alta graduacin alcohlica.  No beba con el estmago vaco.  Mantngase hidratado con agua, gaseosas dietticas o t helado sin  azcar.  Tenga en cuenta que las gaseosas comunes, los jugos y otros refrescos pueden contener mucha azcar y se deben contar como carbohidratos.  Consejos para seguir Consulting civil engineer las etiquetas de los alimentos  Comience por controlar el tamao de la porcin en la etiqueta. La cantidad de caloras, carbohidratos, grasas y otros nutrientes mencionados en la etiqueta se basan en una porcin del alimento. Muchos alimentos contienen ms de una porcin por envase.  Verifique la cantidad total de gramos (g) de carbohidratos totales en una porcin. Puede calcular la cantidad de porciones de carbohidratos al dividir el total de carbohidratos por 15. Por ejemplo, si un alimento posee un total de 30g de carbohidratos, equivale a 2 porciones de carbohidratos.  Verifique la cantidad de gramos (g) de grasas saturadas y grasas trans en una porcin. Escoja alimentos que no contengan grasa o que tengan un bajo contenido.  Controle la cantidad de miligramos (mg) de sodio en una porcin. La mayora de las personas deben limitar la ingesta de sodio total a menos de 2300mg  por Futures trader.  Siempre consulte la informacin nutricional de los alimentos etiquetados como "con bajo contenido de grasa" o "sin grasa". Estos alimentos pueden ser ms altos en azcar agregada o en carbohidratos refinados y deben evitarse.  Hable con el nutricionista para identificar sus objetivos diarios en cuanto a los nutrientes mencionados en la etiqueta. De compras  Evite comprar alimentos procesados, enlatados o prehechos. Estos alimentos tienden a Civil Service fast streamer cantidad de [place], sodio y azcar agregada.  Compre en la zona exterior de la tienda de comestibles. Esta incluye frutas y Medtronic, granos a granel, carnes frescas y productos lcteos frescos. Coccin  Utilice mtodos de coccin a baja temperatura, como hornear, en lugar de mtodos de coccin a alta temperatura, como frer en abundante aceite.  Cocine con aceites  saludables, como el aceite de [place], canola o [place].  Evite cocinar con manteca, crema o carnes con alto contenido de grasa. Planificacin de las comidas  [employer] comidas y las colaciones de forma regular, preferentemente a la misma hora todos [place]. Evite pasar largos perodos de tiempo sin comer.  Consuma alimentos ricos en fibra, como frutas frescas, verduras, frijoles y cereales integrales. Consulte al nutricionista sobre cuntas porciones de carbohidratos puede consumir en cada comida.  Consuma entre 4 y 6 onzas de protenas magras por da, como carnes [place], pollo, pescado, [employer]illard[employer] o tofu. 1 onza equivale a 1 onza de carne, pollo o pescado, 1 huevo, o 1/4 taza de tofu.  Coma algunos alimentos por da que contengan grasas saludables, como aguacates, frutos secos, semillas y pescado. Estilo de vida   Controle su nivel de glucemia con regularidad.  Haga ejercicio al menos , 5das o ms por semana, o como se lo haya indicado el mdico.  Tome los Monsanto Company se lo haya indicado el mdico.  No consuma ningn producto que contenga nicotina o tabaco, como cigarrillos y Administrator, Civil Service. Si necesita ayuda para dejar de fumar, consulte al CIGNA con un asesor o instructor en diabetes para identificar estrategias para controlar el estrs y cualquier desafo emocional y social. [place] son algunas de  las preguntas que puedo hacerle a mi mdico?  Es necesario que me rena con IT trainerun instructor en diabetes?  Es necesario que me rena con un nutricionista?  A qu nmero puedo llamar si tengo preguntas?  Cules son los mejores momentos para controlar la glucemia? Dnde encontrar ms informacin:  Asociacin Americana de la Diabetes (American Diabetes Association): diabetes.org/food-and-fitness/food  Academia de Nutricin y Pension scheme managerDiettica (Academy of Nutrition and Dietetics):  https://www.vargas.com/www.eatright.org/resources/health/diseases-and-conditions/diabetes  The Krogernstituto Nacional de la Diabetes y las Enfermedades Digestivas y Special educational needs teacherenales [facility](National Institute of Diabetes and Digestive and Kidney Diseases) (Institutos Nacionales de [place] CitySalud, NIH): FindJewelers.czwww.niddk.nih.gov/health-information/diabetes/overview/diet-eating-physical-activity Resumen  Un plan de alimentacin saludable lo ayudar a Scientist, physiologicalcontrolar la glucemia y Pharmacologistmantener un estilo de vida saludable.  Trabajar con un especialista en dietas y nutricin (nutricionista) puede ayudarlo a Designer, television/film setelaborar el mejor plan de alimentacin para usted.  Tenga en cuenta que los carbohidratos y el alcohol tienen efectos inmediatos en sus niveles de glucemia. Es importante contar los carbohidratos y consumir alcohol con prudencia. Esta informacin no tiene Theme park managercomo fin reemplazar el consejo del mdico. Asegrese de hacerle al mdico cualquier pregunta que tenga. Document Released: 01/03/2008 Document Revised: 01/16/2017 Document Reviewed: 01/16/2017 Elsevier Interactive Patient Education  2018 [employer]

## 2018-03-15 LAB — COMPREHENSIVE METABOLIC PANEL
ALT: 125 IU/L — ABNORMAL HIGH (ref 0–32)
AST: 113 IU/L — ABNORMAL HIGH (ref 0–40)
Albumin/Globulin Ratio: 1.9 (ref 1.2–2.2)
Albumin: 4.6 g/dL (ref 3.5–5.5)
Alkaline Phosphatase: 130 IU/L — ABNORMAL HIGH (ref 39–117)
BUN/Creatinine Ratio: 16 (ref 9–23)
BUN: 9 mg/dL (ref 6–24)
Bilirubin Total: 0.5 mg/dL (ref 0.0–1.2)
CO2: 21 mmol/L (ref 20–29)
Calcium: 8.9 mg/dL (ref 8.7–10.2)
Chloride: 98 mmol/L (ref 96–106)
Creatinine, Ser: 0.57 mg/dL (ref 0.57–1.00)
GFR calc Af Amer: 127 mL/min/{1.73_m2} (ref >59)
GFR calc non Af Amer: 110 mL/min/{1.73_m2} (ref >59)
Globulin, Total: 2.4 g/dL (ref 1.5–4.5)
Glucose: 242 mg/dL — ABNORMAL HIGH (ref 65–99)
Potassium: 4.1 mmol/L (ref 3.5–5.2)
Sodium: 135 mmol/L (ref 134–144)
Total Protein: 7 g/dL (ref 6.0–8.5)

## 2018-03-15 LAB — URINE CYTOLOGY ANCILLARY ONLY
Bacterial vaginitis: POSITIVE — AB
Candida vaginitis: NEGATIVE
Chlamydia: NEGATIVE
Neisseria Gonorrhea: NEGATIVE
Trichomonas: NEGATIVE

## 2018-03-16 LAB — URINE CULTURE

## 2018-03-19 ENCOUNTER — Telehealth (INDEPENDENT_AMBULATORY_CARE_PROVIDER_SITE_OTHER): Payer: Self-pay | Admitting: *Deleted

## 2018-03-19 NOTE — Telephone Encounter (Signed)
-----   Message from Anders SimmondsAngela M McClung, [place] JerseyPA-C sent at 03/16/2018  2:10 PM EDT ----- Please call patient.  Urine culture did show infection.  Finish all antibiotics and drink lots of water.   Thanks,  Georgian CoAngela McClung, PA-C

## 2018-03-19 NOTE — Telephone Encounter (Signed)
Medical Assistant used [employer] to contact patient.  Interpreter Name: Para MarchFrancisco Interpreter #: [phone] Patient is aware sugar and liver levels being elevated and needing to adhere to new medications for DM as well as avoiding alcohol and OTC NSAIDS. Patient is also aware of UTI being noted and completing antibiotics. Patient advised to follow up as planned. Patient reports completing Antibiotic and controlling the blood sugar.

## 2018-04-13 ENCOUNTER — Ambulatory Visit: Payer: Self-pay | Attending: Internal Medicine | Admitting: Internal Medicine

## 2018-04-13 ENCOUNTER — Encounter: Payer: Self-pay | Admitting: Internal Medicine

## 2018-04-13 VITALS — BP 120/75 | HR 64 | Temp 98.6°F | Resp 18 | Ht 66.0 in | Wt 176.0 lb

## 2018-04-13 DIAGNOSIS — Z833 Family history of diabetes mellitus: Secondary | ICD-10-CM | POA: Insufficient documentation

## 2018-04-13 DIAGNOSIS — Z79899 Other long term (current) drug therapy: Secondary | ICD-10-CM | POA: Insufficient documentation

## 2018-04-13 DIAGNOSIS — Z823 Family history of stroke: Secondary | ICD-10-CM | POA: Insufficient documentation

## 2018-04-13 DIAGNOSIS — Z7984 Long term (current) use of oral hypoglycemic drugs: Secondary | ICD-10-CM | POA: Insufficient documentation

## 2018-04-13 DIAGNOSIS — K760 Fatty (change of) liver, not elsewhere classified: Secondary | ICD-10-CM | POA: Insufficient documentation

## 2018-04-13 DIAGNOSIS — Z23 Encounter for immunization: Secondary | ICD-10-CM

## 2018-04-13 DIAGNOSIS — E1165 Type 2 diabetes mellitus with hyperglycemia: Secondary | ICD-10-CM

## 2018-04-13 DIAGNOSIS — Z8249 Family history of ischemic heart disease and other diseases of the circulatory system: Secondary | ICD-10-CM | POA: Insufficient documentation

## 2018-04-13 DIAGNOSIS — E119 Type 2 diabetes mellitus without complications: Secondary | ICD-10-CM | POA: Insufficient documentation

## 2018-04-13 DIAGNOSIS — L84 Corns and callosities: Secondary | ICD-10-CM | POA: Insufficient documentation

## 2018-04-13 LAB — GLUCOSE, POCT (MANUAL RESULT ENTRY): POC Glucose: 125 mg/dl — AB (ref 70–99)

## 2018-04-13 NOTE — Progress Notes (Signed)
Patient ID: Linda Delgado, female    DOB: 08/01/1971  MRN: [id number]  CC: Diabetes   Subjective: Linda Delgado is a 47 y.o. female who presents for chronic ds management.  Her concerns today include:  Pt with hx of NAFLD, obesity, DM  DM: dx with DM on visit 1 mth ago with PA.  Started on Metformin 500 mg BID; confirms compliance.  Tolerating metformin okay.   -checks BS daily in a.m.  Has log book with her for review.  Initially BS in 200s.  Over past 3 wks BS 108-140 -Eating habits:  She has made some changes:  She has  cut back on white carbs, eating more veggies and more chicken/fish -Exercise: walks 30 mins and rides bike for 30 mins 5-6 days a wk -had comprehensive eye exam at [facility] 2 wks ago.  No retinopathy -painful callous on sole LT foot.  Purchase a Dr. Felicie Morn product OTC to use.   NAFLD:  LFTs done on last visit revealed more elev in AST/ALT  Patient Active Problem List   Diagnosis Date Noted  . Mastalgia in female 02/23/2017  . NAFLD (nonalcoholic fatty liver disease) 02/23/2017  . Class 1 obesity due to excess calories without serious comorbidity with body mass index (BMI) of 30.0 to 30.9 in adult 02/23/2017  . Thyroid activity decreased 11/13/2014  . Menorrhagia 04/02/2014     Current Outpatient Medications on File Prior to Visit  Medication Sig Dispense Refill  . Blood Glucose Monitoring Suppl (TRUE METRIX METER) w/Device KIT 1 each by Does not apply route 3 (three) times daily. 1 kit 0  . glucose blood (TRUE METRIX BLOOD GLUCOSE TEST) test strip Use as instructed 100 each 12  . metFORMIN (GLUCOPHAGE) 500 MG tablet Take 1 tablet (500 mg total) by mouth 2 (two) times daily with a meal. Then increase to 2 tabs twice daily in 1 week 180 tablet 3  . omega-3 acid ethyl esters (LOVAZA) 1 G capsule Take by mouth 2 (two) times daily.    . TRUEPLUS LANCETS 30G MISC Use as directed 100 each 2  . diclofenac sodium (VOLTAREN) 1 % GEL Apply 2 g  topically 4 (four) times daily. (Patient not taking: Reported on 03/14/2018) 100 g 0  . ferrous sulfate 325 (65 FE) MG tablet Take 325 mg by mouth daily with breakfast.    . methocarbamol (ROBAXIN-750) 750 MG tablet Take 1 tablet (750 mg total) by mouth every 8 (eight) hours as needed for muscle spasms. (Patient not taking: Reported on 04/13/2018) 20 tablet 0  . Multiple Vitamin (MULTIVITAMIN) tablet Take 1 tablet by mouth daily.     No current facility-administered medications on file prior to visit.     No Known Allergies  Social History   Socioeconomic History  . Marital status: Married    Spouse name: Not on file  . Number of children: Not on file  . Years of education: Not on file  . Highest education level: Not on file  Occupational History  . Not on file  Social Needs  . Financial resource strain: Not on file  . Food insecurity:    Worry: Not on file    Inability: Not on file  . Transportation needs:    Medical: Not on file    Non-medical: Not on file  Tobacco Use  . Smoking status: Never Smoker  . Smokeless tobacco: Never Used  Substance and Sexual Activity  . Alcohol use: Yes    Comment:  rarely  . Drug use: No  . Sexual activity: Yes    Partners: Male    Birth control/protection: None  Lifestyle  . Physical activity:    Days per week: 4 days    Minutes per session: 60 min  . Stress: To some extent  Relationships  . Social connections:    Talks on phone: Never    Gets together: Twice a week    Attends religious service: More than 4 times per year    Active member of club or organization: Yes    Attends meetings of clubs or organizations: More than 4 times per year    Relationship status: Married  . Intimate partner violence:    Fear of current or ex partner: No    Emotionally abused: No    Physically abused: No    Forced sexual activity: No  Other Topics Concern  . Not on file  Social History Narrative   Patient doesn't like to talk on the phone     Family History  Problem Relation Age of Onset  . Diabetes Father   . Hypertension Father   . Stroke Father   . Hypertension Mother   . Cancer Paternal Grandmother        stomach  . Breast cancer Paternal Aunt     Past Surgical History:  Procedure Laterality Date  . [place], 2007  . WISDOM TOOTH EXTRACTION      ROS: Review of Systems Negative except as stated above  PHYSICAL EXAM: BP 120/75 (BP Location: Left Arm, Patient Position: Sitting, Cuff Size: Normal)   Pulse 64   Temp 98.6 F (37 C) (Oral)   Resp 18   Ht '5\' 6"'  (1.676 m)   Wt 176 lb (79.8 kg)   LMP 03/28/2018   SpO2 99%   BMI 28.41 kg/m   Wt Readings from Last 3 Encounters:  04/13/18 176 lb (79.8 kg)  03/14/18 179 lb (81.2 kg)  11/02/17 191 lb 6.4 oz (86.8 kg)    Physical Exam General appearance - alert, well appearing, and in no distress Mental status - normal mood, behavior, speech, dress, motor activity, and thought processes Neck - supple, no significant adenopathy Chest - clear to auscultation, no wheezes, rales or rhonchi, symmetric air entry Heart - normal rate, regular rhythm, normal S1, S2, no murmurs, rubs, clicks or gallops Extremities - peripheral pulses normal, no pedal edema, no clubbing or cyanosis Diabetic Foot Exam - Simple   Simple Foot Form Visual Inspection See comments:  Yes Sensation Testing Intact to touch and monofilament testing bilaterally:  Yes Pulse Check Posterior Tibialis and Dorsalis pulse intact bilaterally:  Yes Comments Small callous ball LT foot.  Non-inflam bunion both big toes    Results for orders placed or performed in visit on 04/13/18  Glucose (CBG)  Result Value Ref Range   POC Glucose 125 (A) 70 - 99 mg/dl   Lab Results  Component Value Date   HGBA1C 11.7 (A) 03/14/2018     Chemistry      Component Value Date/Time   NA 135 03/14/2018 1153   K 4.1 03/14/2018 1153   CL 98 03/14/2018 1153   CO2 21 03/14/2018 1153   BUN 9  03/14/2018 1153   CREATININE 0.57 03/14/2018 1153   CREATININE 0.65 03/10/2015 1453      Component Value Date/Time   CALCIUM 8.9 03/14/2018 1153   ALKPHOS 130 (H) 03/14/2018 1153   AST 113 (H) 03/14/2018 1153  ALT 125 (H) 03/14/2018 1153   BILITOT 0.5 03/14/2018 1153       ASSESSMENT AND PLAN: 1. Controlled type 2 diabetes mellitus without complication, without long-term current use of insulin ([place]) Commended her for getting blood sugars under control fairly quickly.  Continue to encourage her with healthy eating habits.  Further dietary counseling given.  Continue regular exercise.  Continue metformin. - Glucose (CBG) - Pneumococcal polysaccharide vaccine 23-valent greater than or equal to 2yo subcutaneous/IM - Microalbumin / creatinine urine ratio  2. NAFLD (nonalcoholic fatty liver disease) Discussed the importance of continued healthy eating and regular exercise to achieve weight loss. - Lipid panel - Hepatic Function Panel  3. Need for vaccination against Streptococcus pneumoniae To be given by pharmacy  4. Pre-ulcerative corn or callous - Ambulatory referral to Podiatry  Patient was given the opportunity to ask questions.  Patient verbalized understanding of the plan and was able to repeat key elements of the plan.  [employer] interpreter used during this encounter.  Orders Placed This Encounter  Procedures  . Pneumococcal polysaccharide vaccine 23-valent greater than or equal to 2yo subcutaneous/IM  . Lipid panel  . Hepatic Function Panel  . Microalbumin / creatinine urine ratio  . Ambulatory referral to Podiatry  . Glucose (CBG)     Requested Prescriptions    No prescriptions requested or ordered in this encounter    Return in about 3 months (around 07/14/2018).  Karle Plumber, MD, FACP

## 2018-04-13 NOTE — Patient Instructions (Signed)
Diabetes mellitus y nutricin Diabetes Mellitus and Nutrition Si sufre de diabetes (diabetes mellitus), es muy importante tener hbitos alimenticios saludables debido a que sus niveles de Designer, television/film set sangre (glucosa) se ven afectados en gran medida por lo que come y bebe. [place] alimentos saludables en las cantidades [place], aproximadamente a la [employer], [place] ayudar a:  Aeronautical engineer glucemia.  Disminuir el riesgo de sufrir una enfermedad cardaca.  Mejorar la presin arterial.  Science writer o mantener un peso saludable.  Todas las personas que sufren de diabetes son diferentes y cada una tiene necesidades diferentes en cuanto a un plan de alimentacin. El mdico puede recomendarle que trabaje con un especialista en dietas y nutricin (nutricionista) para Financial trader plan para usted. Su plan de alimentacin puede variar segn factores como:  Las caloras que necesita.  Los medicamentos que toma.  Su peso.  Sus niveles de glucemia, presin arterial y colesterol.  Su nivel de [place].  Otras afecciones que tenga, como enfermedades cardacas o renales.  Cmo me afectan los carbohidratos? Los carbohidratos afectan el nivel de glucemia ms que cualquier otro tipo de alimento. La ingesta de carbohidratos naturalmente aumenta la cantidad glucosa en la sangre. El recuento de carbohidratos es un mtodo destinado a Catering manager un registro de la cantidad de carbohidratos que se ingieren. El recuento de carbohidratos es importante para Theatre manager la glucemia a un nivel saludable, en especial si utiliza insulina o toma determinados medicamentos por va oral para la diabetes. Es importante saber la cantidad de carbohidratos que se pueden ingerir en cada comida sin correr Engineer, manufacturing. Esto es Psychologist, forensic. El nutricionista puede ayudarlo a calcular la cantidad de carbohidratos que debe ingerir en cada comida y colacin. Los alimentos que contienen carbohidratos  incluyen:  Pan, cereal, arroz, pasta y galletas.  Papas y maz.  Guisantes, frijoles y lentejas.  Leche y Estate agent.  Lambert Mody y [place].  Postres, como pasteles, galletitas, helado y caramelos.  Cmo me afecta el alcohol? El alcohol puede provocar disminuciones sbitas de la glucemia (hipoglucemia), en especial si utiliza insulina o toma determinados medicamentos por va oral para la diabetes. La hipoglucemia es una afeccin potencialmente mortal. Los sntomas de la hipoglucemia (somnolencia, mareos y confusin) son similares a los sntomas de haber consumido demasiado alcohol. Si el mdico afirma que el alcohol es seguro para usted, siga estas pautas:  Limite el consumo de alcohol a no ms de 1 medida por da si es mujer y no est Music therapist, y a 2 medidas si es hombre. Una medida equivale a 12oz (324m) de cerveza, 5oz (1459m de vino o 1oz (4458mde bebidas de alta graduacin alcohlica.  No beba con el estmago vaco.  Mantngase hidratado con agua, gaseosas dietticas o t helado sin azcar.  Tenga en cuenta que las gaseosas comunes, los jugos y otros refrescos pueden contener mucha azcar y se deben contar como carbohidratos.  Consejos para seguir estPhotographers etiquetas de los alimentos  Comience por controlar el tamao de la porcin en la etiqueta. La cantidad de caloras, carbohidratos, grasas y otros nutrientes mencionados en la etiqueta se basan en una porcin del alimento. Muchos alimentos contienen ms de una porcin por envase.  Verifique la cantidad total de gramos (g) de carbohidratos totales en una porcin. Puede calcular la cantidad de porciones de carbohidratos al dividir el total de carbohidratos por 15. Por ejemplo, si un alimento posee un total de 30g de carbohidratos, equivale a 2 porciones  de carbohidratos.  Verifique la cantidad de gramos (g) de grasas saturadas y grasas trans en una porcin. Escoja alimentos que no contengan grasa o que tengan un bajo  contenido.  Controle la cantidad de miligramos (mg) de sodio en una porcin. [place] [employer] de las personas deben limitar la ingesta de sodio total a menos de 2321m por dTraining and development officer  Siempre consulte la informacin nutricional de los alimentos etiquetados como "con bajo contenido de grasa" o "sin grasa". Estos alimentos pueden ser ms altos en azcar agregada o en carbohidratos refinados y deben evitarse.  Hable con el nutricionista para identificar sus objetivos diarios en cuanto a los nutrientes mencionados en la etiqueta. De compras  Evite comprar alimentos procesados, enlatados o prehechos. Estos alimentos tienden a tTEFL teachercantidad de gBethel sodio y azcar agregada.  Compre en la zona exterior de la tienda de comestibles. Esta incluye frutas y vNorthrop Grumman granos a granel, carnes frescas y productos lcteos frescos. Coccin  Utilice mtodos de coccin a baja temperatura, como hornear, en lugar de mtodos de coccin a alta temperatura, como frer en abundante aceite.  Cocine con aceites saludables, como el aceite de oAlva canola o gDale[place]  Evite cocinar con manteca, crema o carnes con alto contenido de grasa. Planificacin de las comidas  CInternational Papercomidas y las colaciones de forma regular, preferentemente a la misma hora todos lHaines City Evite pasar largos perodos de tiempo sin comer.  Consuma alimentos ricos en fibra, como frutas frescas, verduras, frijoles y cereales integrales. Consulte al nutricionista sobre cuntas porciones de carbohidratos puede consumir en cada comida.  Consuma entre 4 y 6 onzas de protenas magras por da, como carnes mArgyle pollo, pescado, hFirst Data Corporationo tofu. 1 onza equivale a 1 onza de carne, pollo o pescado, 1 huevo, o 1/4 taza de tofu.  Coma algunos alimentos por da que contengan grasas saludables, como aguacates, frutos secos, semillas y pescado. Estilo de vida   Controle su nivel de glucemia con regularidad.  Haga ejercicio al menos 366mutos,  5das o ms por semana, o como se lo haya indicado el mdico.  Tome los meTenneco Ince lo haya indicado el mdico.  No consuma ningn producto que contenga nicotina o tabaco, como cigarrillos y ciPsychologist, sport and exerciseSi necesita ayuda para dejar de fumar, consulte al mdHess Corporationon un asesor o instructor en diabetes para identificar estrategias para controlar el estrs y cualquier desafo emocional y social. Cules son algunas de las preguntas que puedo hacerle a mi mdico?  Es necesario que me rena con unRadio broadcast assistantn diabetes?  Es necesario que me rena con un nutricionista?  A qu nmero puedo llamar si tengo preguntas?  Cules son los mejores momentos para controlar la glucemia? Dnde encontrar ms informacin:  Asociacin Americana de la Diabetes (American Diabetes Association): diabetes.org/food-and-fitness/food  Academia de Nutricin y DiInformation systems managerAcademy of Nutrition and Dietetics): wwPokerClues.dkInNew Riegeliabetes y laWatertown Town ReWater quality scientistNMedstar Saint Mary'S Hospitalf Diabetes and Digestive and Kidney Diseases) (InGulkanaNIH): wwContactWire.beesumen  Un plan de alimentacin saludable lo ayudar a coAeronautical engineerlucemia y maTheatre managern estilo de vida saludable.  Trabajar con un especialista en dietas y nutricin (nutricionista) puede ayudarlo a elInsurance claims handlere alimentacin para usted.  Tenga en cuenta que los carbohidratos y el alcohol tienen efectos inmediatos en sus niveles de glucemia. Es importante contar los carbohidratos y consumir alcohol con prudencia. Esta informacin no tiene coMarine scientistl consejo del  mdico. Asegrese de hacerle al mdico cualquier pregunta que tenga. Document Released: 01/03/2008 Document Revised: 01/16/2017 Document Reviewed:  01/16/2017 Elsevier Interactive Patient Education  2018 [place] diabetes mellitus y las normas bsicas de atencin mdica Diabetes Mellitus and Standards of Medical Care Tratar la diabetes (diabetes mellitus) puede ser complicado. Su tratamiento de la diabetes puede ser administrado por un equipo de profesionales de la salud, que incluye:  Un especialista en dietas y nutricin (nutricionista registrado).  Un enfermero.  Un educador certificado para el cuidado de la diabetes.  Un especialista en diabetes (endocrinlogo).  Un oculista.  Un mdico de cabecera.  Un dentista.  Sus mdicos siguen un programa para ayudarle a Engineer, production calidad en atencin. El programa siguiente es una gua general para su plan de control de la diabetes. Sus mdicos tambin podrn darle instrucciones ms especficas. Anlisis de HbA1c ( hemoglobina A1c) Este anlisis proporciona informacin sobre el control de la glucemia (glucosa en la sangre) durante los ltimos 2 o 23mses. Se utiliza para verificar si el plan de control de la diabetes debe ser modificado.  Si cumple los objetivos del tWinona este anlisis se realiza al mHalliburton Companyal ao.  Si no cumple los objetivos del tratamiento o si sus objetivos han cNepal este anlisis se realiza cuatro veces al ao.  Control de la presin arterial  Este control se realiza en cada visita mdica de rutina. Para la mComcast la meta es menos que 130/80. Consltele a su mdico cul es su meta para la presin arterial. Exmenes dentales y oculares  Visite al dAvayapor ao.  Si tiene diabetes tipo1, hgase un examen ocular en el trmino de 3 a 5aos despus del diagnstico y, luego, uArdelia Memsvez al ao despus del pTree surgeon ? Si le diagnosticaron diabetes tipo 1 siendo un nio, debe hacerse estudios al llegar a los 10 aos o ms y si ha tenido diabetes durante un perodo de 3 a 5 aos. Despus del primer  examen, debe hacerse un examen ocular todos los aos.  Si tiene diabetes tipo2, hgase un examen ocular tan pronto como le diagnostiquen la enfermedad y, [place] una vez por ao despus del pTree surgeon Examen de los pies  Una inspeccin visual de pies se hace en cada visita mdica de rutina. En este examen se buscan cortes, moretones, enrojecimiento, ampollas, llagas u otros problemas en los pies.  Su mdico le rChartered certified accountantexamen de pies completo una vez por ao. Este examen incluye revisar la estructura y la piel de los pies, y examinar los pulsos y la sensacin de los pies. ? Diabetes tipo1: Hgase su primer examen en el trmino de 3 a 5 aos despus del diagnstico. ? Diabetes tipo2: Hgase su primer examen tan pronto como le diagnostiquen la enfermedad.  Examnese a diario los pies en busca de cortes, moretones, enrojecimiento, ampollas o llagas. Si tiene alguno de eAlexandria Bayu otros problemas y no se curan, pngase en contacto con su mdico. Estudio de la funcin renal ( microalbmina en orina)  Este estudio se realiza una vez por ao. ? Diabetes tipo1: Hgase su primer estudio cinco aos despus del diagnstico. ? Diabetes tipo2: Hgase su primer estudio tan pronto como le diagnostiquen la enfermedad.  Si tiene enfermedad renal crnica, hgase un examen de creatinina srica y de ndice de filtracin glomerular estimada (eGFR, por sus siglas en ingls) una vez por ao. Perfil lipdico (colesterol, HDL, LDL, triglicridos)  Este examen se debe realizar cuando le diagnostiquen diabetes y cada cinco aos luego del Tree surgeon. Si est tomando medicamentos para bajar su colesterol, es posible que se deba realizar este examen cada ao. ? En relacin al LDL, el objetivo es tener menos de 120m/dl (5,5 mmol/l). Si tiene aUnited Parcel el objetivo es tener menos de 70 mg/dl (3,9 mmol/l). ? En relacin al HDL, el objetivo es tener 40 mg/dl (2,2 mmol/l) para los hombres y 50 mg/dl (2,8 mmol/l)  para las mujeres. Un nivel de colesterol HDL de 60 mg/dl (3,3 mmol/l) o superior da una cierta proteccin contra la enfermedad cardaca. ? En relacin a los triglicridos, el objetivo es tener menos de 150 mg/dl (8,3 mmol/l). Vacunas  Se recomienda aplicar de forma anual la vacuna contra la gripe (influenza) a todas las personas de 664mes en adelante que tengan diabetes.  La vacuna contra la neumona (antineumoccica) est recomendada para todas las personas de 2aos en adelante que tengan diabetes. Si tiene 65aos o ms, puede recibir laEngineer, manufacturingomo una serie de dos inyecciones diRoyal Kunia Se recomienda administrar la vacuna contra la hepatitisB en adultos poco despus de que hayan recibido el diagnstico de diabetes.  La vacuna Tdap (contra el ttanos, la difteria y la tosferina) debe aplicarse de la siguiente manera: ? Segn las pautas normales de vacunacin infantil en el caso de los nios. ? Cada 10aos en el caso de los adultos con diabetes.  La vacuna contra la culebrilla (herpes zster) se recomienda en personas que han tenido varicela y que tiene 5055os de edad o ms. Salud mental y emocional  Se recomienda reOptometristontroles para deHydrographic surveyorntomas de trastornos de laYouth workeransiedad y depresin en el momento del diagnstico, y en una etapa posterior segn sea necesario. Si los controles revelan la presencia de sntomas (el resultado de los controles es positivo), es posible que deba someterse a evaluaciones posteriores y lo deriven a un mdico esRegulatory affairs officerEducacin para el autocontrol de la diabetes  Es recomendable que se informe sobre cmo controlar su diabetes en el momento de recibir el diagnstico y luProctorsegn sea necesario. Plan de tratamiento  Su plan de tratamiento ser revisado en cada visita mdica. Resumen  Tratar la diabetes (diabetes mellitus) puede ser complicado. Su tratamiento de la diabetes puede ser  administrado por un equipo de profesionales de laTechnical sales engineer Sus mdicos siguen un programa para ayudarle a obEngineer, productionalidad en atencin.  Las normas asistenciales incluyen realizarse, regularmente, exmenes fsicos, anlisis de sangre y controles de la presin arterial, aplicarse vacunas, someterse a estudios de control e informarse sobre cmo controlar su diabetes.  Sus mdicos tambin podrn darle instrucciones ms especficas basndose en su salud. Esta informacin no tiene coMarine scientistl consejo del mdico. Asegrese de hacerle al mdico cualquier pregunta que tenga. Document Released: 12/21/2009 Document Revised: 09/07/2016 Document Reviewed: 02/26/2013 Elsevier Interactive Patient Education  20[employer]

## 2018-04-14 LAB — HEPATIC FUNCTION PANEL
ALT: 64 IU/L — ABNORMAL HIGH (ref 0–32)
AST: 45 IU/L — ABNORMAL HIGH (ref 0–40)
Albumin: 4.7 g/dL (ref 3.5–5.5)
Alkaline Phosphatase: 82 IU/L (ref 39–117)
Bilirubin Total: 0.5 mg/dL (ref 0.0–1.2)
Bilirubin, Direct: 0.12 mg/dL (ref 0.00–0.40)
Total Protein: 7.1 g/dL (ref 6.0–8.5)

## 2018-04-14 LAB — MICROALBUMIN / CREATININE URINE RATIO
Creatinine, Urine: 34.7 mg/dL
Microalb/Creat Ratio: <8.6 mg/g creat (ref 0.0–30.0)
Microalbumin, Urine: <3 ug/mL

## 2018-04-14 LAB — LIPID PANEL
Chol/HDL Ratio: 2.7 ratio (ref 0.0–4.4)
Cholesterol, Total: 161 mg/dL (ref 100–199)
HDL: 59 mg/dL (ref >39)
LDL Calculated: 78 mg/dL (ref 0–99)
Triglycerides: 120 mg/dL (ref 0–149)
VLDL Cholesterol Cal: 24 mg/dL (ref 5–40)

## 2018-05-03 MED FILL — TRUEplus LANCETS 28G MISC: 25 days supply | Qty: 100 | Fill #1

## 2018-05-03 MED FILL — TRUE METRIX TEST STRIP: 25 days supply | Qty: 100 | Fill #1

## 2018-05-03 MED FILL — metFORMIN HCL 500 MG TABS: 500 | 30 days supply | Qty: 120 | Fill #1

## 2018-05-07 ENCOUNTER — Ambulatory Visit: Payer: Self-pay

## 2018-05-08 ENCOUNTER — Ambulatory Visit
Admission: RE | Admit: 2018-05-08 | Discharge: 2018-05-08 | Disposition: A | Discharge status: Home / Self Care | Payer: No Typology Code available for payment source | Source: Ambulatory Visit | Attending: Obstetrics and Gynecology | Admitting: Obstetrics and Gynecology

## 2018-05-08 ENCOUNTER — Other Ambulatory Visit: Payer: Self-pay | Admitting: Obstetrics and Gynecology

## 2018-05-08 ENCOUNTER — Other Ambulatory Visit: Payer: No Typology Code available for payment source

## 2018-05-08 ENCOUNTER — Inpatient Hospital Stay: Admission: RE | Admit: 2018-05-08 | Payer: Self-pay | Source: Ambulatory Visit

## 2018-05-08 DIAGNOSIS — N6310 Unspecified lump in the right breast, unspecified quadrant: Secondary | ICD-10-CM

## 2018-05-08 DIAGNOSIS — Z1231 Encounter for screening mammogram for malignant neoplasm of breast: Secondary | ICD-10-CM

## 2018-05-11 ENCOUNTER — Ambulatory Visit: Payer: Self-pay

## 2018-07-12 MED FILL — metFORMIN HCL 500 MG TABS: 500 | 30 days supply | Qty: 120 | Fill #2

## 2018-07-17 ENCOUNTER — Ambulatory Visit: Payer: Self-pay | Admitting: Internal Medicine

## 2018-08-10 ENCOUNTER — Ambulatory Visit: Payer: Self-pay | Attending: Internal Medicine | Admitting: Internal Medicine

## 2018-08-10 ENCOUNTER — Encounter: Payer: Self-pay | Admitting: Internal Medicine

## 2018-08-10 VITALS — BP 127/74 | HR 63 | Temp 97.5°F | Resp 16 | Wt 172.2 lb

## 2018-08-10 DIAGNOSIS — K760 Fatty (change of) liver, not elsewhere classified: Secondary | ICD-10-CM | POA: Insufficient documentation

## 2018-08-10 DIAGNOSIS — R42 Dizziness and giddiness: Secondary | ICD-10-CM

## 2018-08-10 DIAGNOSIS — E119 Type 2 diabetes mellitus without complications: Secondary | ICD-10-CM

## 2018-08-10 DIAGNOSIS — Z79899 Other long term (current) drug therapy: Secondary | ICD-10-CM | POA: Insufficient documentation

## 2018-08-10 DIAGNOSIS — Z23 Encounter for immunization: Secondary | ICD-10-CM

## 2018-08-10 LAB — POCT GLYCOSYLATED HEMOGLOBIN (HGB A1C): Hemoglobin A1C: 5.3 % (ref 4.0–5.6)

## 2018-08-10 LAB — GLUCOSE, POCT (MANUAL RESULT ENTRY): POC Glucose: 108 mg/dl — AB (ref 70–99)

## 2018-08-10 NOTE — Progress Notes (Signed)
  Patient ID: Linda Delgado, female    DOB: 06/26/1971  MRN: [id number]  CC: No chief complaint on file.   Subjective: Linda Delgado is a 47 y.o. female who presents for chronic ds management. Her concerns today include:  Pt with hx of NAFLD, obesity, DM  DM/NAFLD: checks BS daily and has log for review.  Range 105-139 Doing well with eating habits Compliant with Metformin Rides a stationary bike 5 days a wk for exercise.  Last LFTs showed AST/ALT decreasing  C/o intermittent dizziness several times a wk for the past 1 wk; episodes last a few seconds.  Not associated with position changes or position of head.  Tries to drink several glasses water daily.   Previous history of anemia for which she was on iron.  She no longer takes iron. Patient Active Problem List   Diagnosis Date Noted  . Controlled type 2 diabetes mellitus without complication, without long-term current use of insulin (HCC) 04/13/2018  . Mastalgia in female 02/23/2017  . NAFLD (nonalcoholic fatty liver disease) 02/23/2017  . Class 1 obesity due to excess calories without serious comorbidity with body mass index (BMI) of 30.0 to 30.9 in adult 02/23/2017  . Thyroid activity decreased 11/13/2014  . Menorrhagia 04/02/2014     Current Outpatient Medications on File Prior to Visit  Medication Sig Dispense Refill  . metFORMIN (GLUCOPHAGE) 500 MG tablet Take 1 tablet (500 mg total) by mouth 2 (two) times daily with a meal. Then increase to 2 tabs twice daily in 1 week 180 tablet 3  . omega-3 acid ethyl esters (LOVAZA) 1 G capsule Take by mouth 2 (two) times daily.    . Blood Glucose Monitoring Suppl (TRUE METRIX METER) w/Device KIT 1 each by Does not apply route 3 (three) times daily. (Patient not taking: Reported on 08/10/2018) 1 kit 0  . diclofenac sodium (VOLTAREN) 1 % GEL Apply 2 g topically 4 (four) times daily. (Patient not taking: Reported on 03/14/2018) 100 g 0  . glucose blood (TRUE METRIX  BLOOD GLUCOSE TEST) test strip Use as instructed (Patient not taking: Reported on 08/10/2018) 100 each 12  . Multiple Vitamin (MULTIVITAMIN) tablet Take 1 tablet by mouth daily.    . TRUEPLUS LANCETS 30G MISC Use as directed (Patient not taking: Reported on 08/10/2018) 100 each 2   No current facility-administered medications on file prior to visit.     No Known Allergies  Social History   Socioeconomic History  . Marital status: Married    Spouse name: Not on file  . Number of children: Not on file  . Years of education: Not on file  . Highest education level: Not on file  Occupational History  . Not on file  Social Needs  . Financial resource strain: Not on file  . Food insecurity:    Worry: Not on file    Inability: Not on file  . Transportation needs:    Medical: Not on file    Non-medical: Not on file  Tobacco Use  . Smoking status: Never Smoker  . Smokeless tobacco: Never Used  Substance and Sexual Activity  . Alcohol use: Yes    Comment: rarely  . Drug use: No  . Sexual activity: Yes    Partners: Male    Birth control/protection: None  Lifestyle  . Physical activity:    Days per week: 4 days    Minutes per session: 60 min  . Stress: To some extent  Relationships  .   Social connections:    Talks on phone: Never    Gets together: Twice a week    Attends religious service: More than 4 times per year    Active member of club or organization: Yes    Attends meetings of clubs or organizations: More than 4 times per year    Relationship status: Married  . Intimate partner violence:    Fear of current or ex partner: No    Emotionally abused: No    Physically abused: No    Forced sexual activity: No  Other Topics Concern  . Not on file  Social History Narrative   Patient doesn't like to talk on the phone    Family History  Problem Relation Age of Onset  . Diabetes Father   . Hypertension Father   . Stroke Father   . Hypertension Mother   . Cancer Paternal  Grandmother        stomach  . Breast cancer Paternal Aunt     Past Surgical History:  Procedure Laterality Date  . CESAREAN SECTION  1997, 1999, 2007  . WISDOM TOOTH EXTRACTION      ROS: Review of Systems Negative except as above PHYSICAL EXAM: BP 127/74   Pulse 63   Temp (!) 97.5 F (36.4 C) (Oral)   Resp 16   Wt 172 lb 3.2 oz (78.1 kg)   SpO2 99%   BMI 27.79 kg/m   BP 134/90 sitting, standing 138/82 Physical Exam  General appearance - alert, well appearing, and in no distress Mental status - normal mood, behavior, speech, dress, motor activity, and thought processes Eyes - pupils equal and reactive, extraocular eye movements intact Neck - supple, no significant adenopathy Chest - clear to auscultation, no wheezes, rales or rhonchi, symmetric air entry Heart - normal rate, regular rhythm, normal S1, S2, no murmurs, rubs, clicks or gallops Extremities - peripheral pulses normal, no pedal edema, no clubbing or cyanosis Neurologic -neurologic exam is nonfocal Results for orders placed or performed in visit on 08/10/18  POCT glycosylated hemoglobin (Hb A1C)  Result Value Ref Range   Hemoglobin A1C 5.3 4.0 - 5.6 %   HbA1c POC (<> result, manual entry)     HbA1c, POC (prediabetic range)     HbA1c, POC (controlled diabetic range)    POCT glucose (manual entry)  Result Value Ref Range   POC Glucose 108 (A) 70 - 99 mg/dl     Wt Readings from Last 3 Encounters:  08/10/18 172 lb 3.2 oz (78.1 kg)  04/13/18 176 lb (79.8 kg)  03/14/18 179 lb (81.2 kg)     Chemistry      Component Value Date/Time   NA 135 03/14/2018 1153   K 4.1 03/14/2018 1153   CL 98 03/14/2018 1153   CO2 21 03/14/2018 1153   BUN 9 03/14/2018 1153   CREATININE 0.57 03/14/2018 1153   CREATININE 0.65 03/10/2015 1453      Component Value Date/Time   CALCIUM 8.9 03/14/2018 1153   ALKPHOS 82 04/13/2018 0949   AST 45 (H) 04/13/2018 0949   ALT 64 (H) 04/13/2018 0949   BILITOT 0.5 04/13/2018 0949        ASSESSMENT AND PLAN:  1. Controlled type 2 diabetes mellitus without complication, without long-term current use of insulin (HCC) At goal.  Commended her on this.  Encouraged her to continue healthy eating habits and regular exercise. - POCT glycosylated hemoglobin (Hb A1C) - POCT glucose (manual entry)  2. Need for immunization   against influenza - Flu Vaccine QUAD 36+ mos IM  3.  Dizziness Likely benign vertigo even though exam unrevealing Will check CBC today to rule out recurrent anemia.  Patient was given the opportunity to ask questions.  Patient verbalized understanding of the plan and was able to repeat key elements of the plan.  [employer] interpreter used during this encounter. #[id number]  Orders Placed This Encounter  Procedures  . Flu Vaccine QUAD 36+ mos IM  . CBC  . POCT glycosylated hemoglobin (Hb A1C)  . POCT glucose (manual entry)     Requested Prescriptions    No prescriptions requested or ordered in this encounter    Return in about 4 months (around 12/09/2018).  Karle Plumber, MD, FACP

## 2018-08-10 NOTE — Patient Instructions (Signed)
Influenza Virus Vaccine injection (Fluarix) Qu es este medicamento? La VACUNA ANTIGRIPAL ayuda a disminuir el riesgo de contraer la influenza, tambin conocida como la gripe. La vacuna solo ayuda a protegerle contra algunas cepas de influenza. Esta vacuna no ayuda a reducir el riesgo de contraer influenza pandmica H1N1. Este medicamento puede ser utilizado para otros usos; si tiene alguna pregunta consulte con su proveedor de atencin mdica o con su farmacutico. MARCAS COMUNES: Fluarix, Fluzone Qu le debo informar a mi profesional de la salud antes de tomar este medicamento? Necesita saber si usted presenta alguno de los siguientes problemas o situaciones: -trastorno de sangrado como hemofilia -fiebre o infeccin -sndrome de Guillain-Barre u otros problemas neurolgicos -problemas del sistema inmunolgico -infeccin por el virus de la inmunodeficiencia humana (VIH) o SIDA -niveles bajos de plaquetas en la sangre -esclerosis mltiple -una reaccin alrgica o inusual a las vacunas antigripales, a los huevos, protenas de pollo, al ltex, a la gentamicina, a otros medicamentos, alimentos, colorantes o conservantes -si est embarazada o buscando quedar embarazada -si est amamantando a un beb Cmo debo utilizar este medicamento? Esta vacuna se administra mediante inyeccin por va intramuscular. Lo administra un profesional de la salud. Recibir una copia de informacin escrita sobre la vacuna antes de cada vacuna. Asegrese de leer este folleto cada vez cuidadosamente. Este folleto puede cambiar con frecuencia. Hable con su pediatra para informarse acerca del uso de este medicamento en nios. Puede requerir atencin especial. Sobredosis: Pngase en contacto inmediatamente con un centro toxicolgico o una sala de urgencia si usted cree que haya tomado demasiado medicamento. ATENCIN: Este medicamento es solo para usted. No comparta este medicamento con nadie. Qu sucede si me olvido de  una dosis? No se aplica en este caso. Qu puede interactuar con este medicamento? -quimioterapia o radioterapia -medicamentos que suprimen el sistema inmunolgico, tales como etanercept, anakinra, infliximab y adalimumab -medicamentos que tratan o previenen cogulos sanguneos, como warfarina -fenitona -medicamentos esteroideos, como la prednisona o la cortisona -teofilina -vacunas Puede ser que esta lista no menciona todas las posibles interacciones. Informe a su profesional de la salud de todos los productos a base de hierbas, medicamentos de venta libre o suplementos nutritivos que est tomando. Si usted fuma, consume bebidas alcohlicas o si utiliza drogas ilegales, indqueselo tambin a su profesional de la salud. Algunas sustancias pueden interactuar con su medicamento. A qu debo estar atento al usar este medicamento? Informe a su mdico o a su profesional de la salud sobre todos los efectos secundarios que persistan despus de 3 das. Llame a su proveedor de atencin mdica si se presentan sntomas inusuales dentro de las 6 semanas posteriores a la vacunacin. Es posible que todava pueda contraer la gripe, pero la enfermedad no ser tan fuerte como normalmente. No puede contraer la gripe de esta vacuna. La vacuna antigripal no le protege contra resfros u otras enfermedades que pueden causar fiebre. Debe vacunarse cada ao. Qu efectos secundarios puedo tener al utilizar este medicamento? Efectos secundarios que debe informar a su mdico o a su profesional de la salud tan pronto como sea posible: -reacciones alrgicas como erupcin cutnea, picazn o urticarias, hinchazn de la cara, labios o lengua Efectos secundarios que, por lo general, no requieren atencin mdica (debe informarlos a su mdico o a su profesional de la salud si persisten o si son molestos): -fiebre -dolor de cabeza -molestias y dolores musculares -dolor, sensibilidad, enrojecimiento o hinchazn en el lugar de la  inyeccin -cansancio o debilidad Puede ser que esta lista no   menciona todos los posibles efectos secundarios. Comunquese a su mdico por asesoramiento mdico sobre los efectos secundarios. Usted puede informar los efectos secundarios a la FDA por telfono al 1-800-FDA-1088. Dnde debo guardar mi medicina? Esta vacuna se administra solamente en clnicas, farmacias, consultorio mdico u otro consultorio de un profesional de la salud y no necesitar guardarlo en su domicilio. ATENCIN: Este folleto es un resumen. Puede ser que no cubra toda la posible informacin. Si usted tiene preguntas acerca de esta medicina, consulte con su mdico, su farmacutico o su profesional de la salud.  2018 Elsevier/Gold Standard (2010-03-30 15:31:40)  

## 2018-08-11 LAB — CBC
Hematocrit: 37.6 % (ref 34.0–46.6)
Hemoglobin: 12.6 g/dL (ref 11.1–15.9)
MCH: 28.6 pg (ref 26.6–33.0)
MCHC: 33.5 g/dL (ref 31.5–35.7)
MCV: 85 fL (ref 79–97)
Platelets: 242 10*3/uL (ref 150–450)
RBC: 4.41 x10E6/uL (ref 3.77–5.28)
RDW: 12.9 % (ref 12.3–15.4)
WBC: 8.2 10*3/uL (ref 3.4–10.8)

## 2018-08-13 ENCOUNTER — Telehealth: Payer: Self-pay

## 2018-08-13 NOTE — Telephone Encounter (Signed)
[employer] Mardene Celeste  Id# [phone]  contacted pt to go over lab results pt is aware and doesn't have any questions or concerns

## 2018-08-17 ENCOUNTER — Ambulatory Visit: Payer: Self-pay | Attending: Family Medicine | Admitting: Family Medicine

## 2018-08-17 ENCOUNTER — Encounter: Payer: Self-pay | Admitting: Family Medicine

## 2018-08-17 VITALS — BP 129/84 | HR 65 | Temp 98.0°F | Ht 66.0 in | Wt 173.8 lb

## 2018-08-17 DIAGNOSIS — Z833 Family history of diabetes mellitus: Secondary | ICD-10-CM | POA: Insufficient documentation

## 2018-08-17 DIAGNOSIS — E119 Type 2 diabetes mellitus without complications: Secondary | ICD-10-CM

## 2018-08-17 DIAGNOSIS — Z823 Family history of stroke: Secondary | ICD-10-CM | POA: Insufficient documentation

## 2018-08-17 DIAGNOSIS — J328 Other chronic sinusitis: Secondary | ICD-10-CM | POA: Insufficient documentation

## 2018-08-17 DIAGNOSIS — Z8249 Family history of ischemic heart disease and other diseases of the circulatory system: Secondary | ICD-10-CM | POA: Insufficient documentation

## 2018-08-17 DIAGNOSIS — R519 Headache, unspecified: Secondary | ICD-10-CM

## 2018-08-17 DIAGNOSIS — R51 Headache: Secondary | ICD-10-CM

## 2018-08-17 DIAGNOSIS — J019 Acute sinusitis, unspecified: Secondary | ICD-10-CM

## 2018-08-17 DIAGNOSIS — R42 Dizziness and giddiness: Secondary | ICD-10-CM

## 2018-08-17 LAB — GLUCOSE, POCT (MANUAL RESULT ENTRY): POC Glucose: 100 mg/dL — AB (ref 70–99)

## 2018-08-17 MED ORDER — AMOXICILLIN 500 MG PO CAPS: 500.0000 mg | ORAL_CAPSULE | Freq: Two times a day (BID) | ORAL | 0 refills | Status: AC

## 2018-08-17 MED FILL — AMOXICILLIN 500 MG CAPSULE: 500 | 7 days supply | Qty: 14 | Fill #0

## 2018-08-17 NOTE — Progress Notes (Signed)
Subjective:    Patient ID: Linda Delgado, female    DOB: 04-Jun-1971, 47 y.o.   MRN: [id number]   Due to a language barrier, [employer] video interpreter service used at today's visit  HPI         47 year old female who presents secondary to complaint of 2 weeks of frontal and back of the head headaches as well as dizziness along with occasional nausea.  Patient states that she also has pain at the bilateral temples which is a pressure sensation and she sometimes hears a buzzing noise that she believes is coming from her temples.  Patient states that the noise is similar to sound of blood flow.  Patient states that the symptoms often occur in the mornings.  Patient sometimes feels that she will lose her balance when she is first getting out of bed in the morning.  Upon questioning, patient has a history of type 2 diabetes and has recently seen her primary care provider in follow-up.  Patient states that she has been keeping a fairly strict diabetic diet along with exercise which consist of riding her stationary bike at least 5 days/week.  Patient states that her morning blood sugars are often low, less than 100 and usually in the high 70s to low 80s.   Patient reports that she thought that perhaps her dizziness was related to her history of anemia.  Patient states that she mentioned her history of anemia and current dizziness to her primary care provider at her recent visit.  Patient states that she did have lab work done in follow-up of prior anemia.  Patient reports no unusual bruising or bleeding.  Patient has had no blood in the stool, no blood in the urine and has had no nosebleeds or other unusual bleeding.      Review of systems, patient denies any focal numbness or weakness.  Patient has had some nasal congestion with postnasal drainage.  Patient denies any fever or chills.  Patient has had occasional recent nonproductive cough related to postnasal drainage.  Patient denies any abdominal  pain.  Patient does have occasional nausea especially in the mornings and sometimes has nausea and backwash of bad tasting fluid into her mouth and throat after lying down at night.  Patient states that this usually occurs if she eats late at night.  Patient denies chest pain. Past Medical History:  Diagnosis Date  . Menorrhagia   . Thyroid disease    Past Surgical History:  Procedure Laterality Date  . CESAREAN SECTION  1997, 1999, 2007  . WISDOM TOOTH EXTRACTION     Family History  Problem Relation Age of Onset  . Diabetes Father   . Hypertension Father   . Stroke Father   . Hypertension Mother   . Cancer Paternal Grandmother        stomach  . Breast cancer Paternal Aunt    Social History   Tobacco Use  . Smoking status: Never Smoker  . Smokeless tobacco: Never Used  Substance Use Topics  . Alcohol use: Yes    Comment: rarely  . Drug use: No  No Known Allergies      Review of Systems  Constitutional: Positive for fatigue. Negative for chills and fever.  HENT: Positive for congestion, postnasal drip and rhinorrhea. Negative for nosebleeds, sinus pressure, sinus pain, sneezing, sore throat and trouble swallowing.   Respiratory: Positive for cough (Recent occasional dry cough). Negative for shortness of breath.   Cardiovascular: Negative for chest pain,  palpitations and leg swelling.  Gastrointestinal: Positive for nausea. Negative for abdominal pain, blood in stool, constipation and diarrhea.  Endocrine: Negative for polydipsia, polyphagia and polyuria.  Genitourinary: Negative for dysuria and frequency.  Musculoskeletal: Negative for arthralgias, back pain, gait problem, joint swelling and myalgias.  Neurological: Positive for dizziness, light-headedness and headaches.  Hematological: Negative for adenopathy. Does not bruise/bleed easily.       Objective:   Physical Exam BP 129/84   Pulse 65   Temp 98 F (36.7 C) (Oral)   Ht 5\' 6"  (1.676 m)   Wt 173 lb 12.8 oz  (78.8 kg)   SpO2 98%   BMI 28.05 kg/m  Nurse's notes and vital signs reviewed General-well-nourished, well-developed adult female in no acute distress.  Patient is accompanied by a female friend at today's visit. ENT- TMs dull, nares with edema/erythema of the nasal turbinates and patient with yellow-green nasal discharge, patient with tenderness over the frontal sinuses, patient with posterior pharynx erythema with cobblestoning Neck-supple, no lymphadenopathy, no thyromegaly, no carotid bruit Lungs-clear to auscultation bilaterally Cardiovascular-regular rate and rhythm Abdomen-soft, nontender Back-no CVA tenderness Extremities-no edema Neuro-cranial nerves II through XII are grossly intact, 5/5 strength in all extremities, no nystagmus on eye exam         Assessment & Plan:  1. Controlled type 2 diabetes mellitus without complication, without long-term current use of insulin (HCC) Patient with a random glucose at today's visit of 100.  Patient with a recent hemoglobin A1c on 08/10/2018 at 5.3.  I discussed with the patient that her blood sugars may be dropping too low due to her strict diet and exercise as well as taking metformin twice daily.  Discussed with patient that her low blood sugars may be responsible for her sensation of lightheadedness, nausea and dizziness.  Patient has been asked to stop metformin use over the weekend and call Monday to report blood sugars.  Patient can restart metformin once daily if her fasting blood sugars are greater than 120 over the weekend.  If patient continues to have blood sugars that are remaining less than 120 fasting then she likely does not require the use of metformin at this time.  Patient should however continue to monitor her blood sugars.  Patient is encouraged to check occasional 2-hour postprandial blood sugars as well as blood sugars before bedtime in addition to her fasting blood sugars. - POCT glucose (manual entry)  2.  Dizziness Patient with complaint of dizziness and I discussed with the patient that she likely is having issues with hypoglycemia due to her strict adherence to a diabetic diet as well as exercise.  Patient additionally does have findings consistent with subacute sinusitis on exam.  Patient will be treated for her sinusitis and patient has been asked to stop metformin for now but continue diet, exercise and monitoring of blood sugars to see if her dizziness improves with these 2 measures.  Patient reports a history of anemia but did have recent normal CBC at her PCP visit on 08/10/2018.  Patient is also encouraged to remain well-hydrated.  3. Acute nonintractable headache, unspecified headache type Patient reports recent onset of recurrent headaches both frontal and at the back of the head/neck as well as of the temples.  Based on exam, patient's frontal headache may be related to her current sinus infection.  Discussed with the patient that she is also likely having some hypoglycemic episodes which may be contributing to her other headaches.  Also discussed that she may  have an element of tension type headache.  Patient will have treatment for her sinusitis and patient is also encouraged to take an over-the-counter antihistamine to help with congestion/postnasal drainage and this may also help with her frontal headache.  Patient may use warm moist heat to the posterior neck.  Patient should call or return in the next 1 to 2 weeks if her headaches have not improved.  4. Subacute sinusitis, unspecified location Patient was subacute sinusitis on exam which may be contributing to patient's dizziness as well as frontal headache.  Patient will be treated with amoxicillin 500 mg twice daily x7 days.  Patient is encouraged to use normal saline nasal rinses as well as over-the-counter antihistamines to help with postnasal drainage and frontal headache- amoxicillin (AMOXIL) 500 MG capsule; Take 1 capsule (500 mg total)  by mouth 2 (two) times daily for 7 days. Take after eating  Dispense: 14 capsule; Refill: 0  An After Visit Summary was printed and given to the patient.  Return for call Monday; 1-2 weeks if not feeling better.

## 2018-08-31 ENCOUNTER — Ambulatory Visit: Payer: Self-pay | Attending: Internal Medicine | Admitting: Internal Medicine

## 2018-08-31 ENCOUNTER — Encounter: Payer: Self-pay | Admitting: Internal Medicine

## 2018-08-31 VITALS — BP 132/76 | HR 61 | Temp 97.6°F | Resp 16 | Wt 173.4 lb

## 2018-08-31 DIAGNOSIS — Z833 Family history of diabetes mellitus: Secondary | ICD-10-CM | POA: Insufficient documentation

## 2018-08-31 DIAGNOSIS — E669 Obesity, unspecified: Secondary | ICD-10-CM | POA: Insufficient documentation

## 2018-08-31 DIAGNOSIS — K760 Fatty (change of) liver, not elsewhere classified: Secondary | ICD-10-CM | POA: Insufficient documentation

## 2018-08-31 DIAGNOSIS — Z7984 Long term (current) use of oral hypoglycemic drugs: Secondary | ICD-10-CM | POA: Insufficient documentation

## 2018-08-31 DIAGNOSIS — Z6827 Body mass index (BMI) 27.0-27.9, adult: Secondary | ICD-10-CM | POA: Insufficient documentation

## 2018-08-31 DIAGNOSIS — Z79899 Other long term (current) drug therapy: Secondary | ICD-10-CM | POA: Insufficient documentation

## 2018-08-31 DIAGNOSIS — E119 Type 2 diabetes mellitus without complications: Secondary | ICD-10-CM | POA: Insufficient documentation

## 2018-08-31 DIAGNOSIS — R42 Dizziness and giddiness: Secondary | ICD-10-CM | POA: Insufficient documentation

## 2018-08-31 MED ORDER — METFORMIN HCL 500 MG PO TABS: 500.0000 mg | ORAL_TABLET | Freq: Every day | ORAL | 3 refills | Status: DC

## 2018-08-31 MED FILL — TRUE METRIX TEST STRIP: 25 days supply | Qty: 100 | Fill #2

## 2018-08-31 MED FILL — TRUEplus LANCETS 28G MISC: 25 days supply | Qty: 100 | Fill #2

## 2018-08-31 MED FILL — metFORMIN HCL 500 MG TABS: 500 | 30 days supply | Qty: 120 | Fill #3

## 2018-08-31 NOTE — Progress Notes (Signed)
Pt states she has a mild headache today

## 2018-08-31 NOTE — Progress Notes (Signed)
Patient ID: Linda Delgado, female    DOB: [birth year]  MRN: [id number]  CC: Follow-up (2 week )   Subjective: Linda Delgado is a 47 y.o. female who presents for f/u HA/dizziness and hypoglycemia Her concerns today include:  Pt with hx of NAFLD, obesity, DM  Seen by Dr. Chapman Fitch a little over 2 wks ago with dizziness and HA.  She was assessed to have a sinus infection and intermittent dizziness was thought to be due to hypoglycemic episodes. She was told to stop BID Metformin and to restart once a day dosing only if blood sugar starts to increase.    She has blood sugar log with her.  She has been checking every morning.  BS 119-138 off Metformin.  She restarted Metformin once a day with range noted that 109-140 HA and dizziness a lot less Patient Active Problem List   Diagnosis Date Noted  . Controlled type 2 diabetes mellitus without complication, without long-term current use of insulin ([facility]) 04/13/2018  . Mastalgia in female 02/23/2017  . NAFLD (nonalcoholic fatty liver disease) 02/23/2017  . Class 1 obesity due to excess calories without serious comorbidity with body mass index (BMI) of 30.0 to 30.9 in adult 02/23/2017  . Thyroid activity decreased 11/13/2014  . Menorrhagia 04/02/2014     Current Outpatient Medications on File Prior to Visit  Medication Sig Dispense Refill  . Blood Glucose Monitoring Suppl (TRUE METRIX METER) w/Device KIT 1 each by Does not apply route 3 (three) times daily. (Patient not taking: Reported on 08/10/2018) 1 kit 0  . diclofenac sodium (VOLTAREN) 1 % GEL Apply 2 g topically 4 (four) times daily. (Patient not taking: Reported on 03/14/2018) 100 g 0  . glucose blood (TRUE METRIX BLOOD GLUCOSE TEST) test strip Use as instructed (Patient not taking: Reported on 08/10/2018) 100 each 12  . Multiple Vitamin (MULTIVITAMIN) tablet Take 1 tablet by mouth daily.    Marland Kitchen omega-3 acid ethyl esters (LOVAZA) 1 G capsule Take by mouth 2 (two) times  daily.    . TRUEPLUS LANCETS 30G MISC Use as directed (Patient not taking: Reported on 08/10/2018) 100 each 2   No current facility-administered medications on file prior to visit.     No Known Allergies  Social History   Socioeconomic History  . Marital status: Married    Spouse name: Not on file  . Number of children: Not on file  . Years of education: Not on file  . Highest education level: Not on file  Occupational History  . Not on file  Social Needs  . Financial resource strain: Not on file  . Food insecurity:    Worry: Not on file    Inability: Not on file  . Transportation needs:    Medical: Not on file    Non-medical: Not on file  Tobacco Use  . Smoking status: Never Smoker  . Smokeless tobacco: Never Used  Substance and Sexual Activity  . Alcohol use: Yes    Comment: rarely  . Drug use: No  . Sexual activity: Yes    Partners: Male    Birth control/protection: None  Lifestyle  . Physical activity:    Days per week: 4 days    Minutes per session: 60 min  . Stress: To some extent  Relationships  . Social connections:    Talks on phone: Never    Gets together: Twice a week    Attends religious service: More than 4 times per year  Active member of club or organization: Yes    Attends meetings of clubs or organizations: More than 4 times per year    Relationship status: Married  . Intimate partner violence:    Fear of current or ex partner: No    Emotionally abused: No    Physically abused: No    Forced sexual activity: No  Other Topics Concern  . Not on file  Social History Narrative   Patient doesn't like to talk on the phone    Family History  Problem Relation Age of Onset  . Diabetes Father   . Hypertension Father   . Stroke Father   . Hypertension Mother   . Cancer Paternal Grandmother        stomach  . Breast cancer Paternal Aunt     Past Surgical History:  Procedure Laterality Date  . [place], 2007  . WISDOM  TOOTH EXTRACTION      ROS: Review of Systems Neg except as above PHYSICAL EXAM: BP 132/76   Pulse 61   Temp 97.6 F (36.4 C) (Oral)   Resp 16   Wt 173 lb 6.4 oz (78.7 kg)   SpO2 100%   BMI 27.99 kg/m   Physical Exam  General appearance - alert, well appearing, and in no distress Mental status - normal mood, behavior, speech, dress, motor activity, and thought processes Chest - clear to auscultation, no wheezes, rales or rhonchi, symmetric air entry Heart - normal rate, regular rhythm, normal S1, S2, no murmurs, rubs, clicks or gallops   Results for orders placed or performed in visit on 08/17/18  POCT glucose (manual entry)  Result Value Ref Range   POC Glucose 100 (A) 70 - 99 mg/dl    ASSESSMENT AND PLAN: 1. Controlled type 2 diabetes mellitus without complication, without long-term current use of insulin ([place]) Advised patient to stay on the decreased dose of metformin 500 mg once a day instead of twice a day.  Continue to monitor blood sugars.  If blood sugars begin to drop lower than 90, she can stop the metformin completely.  Continue to encourage healthy eating habits and regular exercise - metFORMIN (GLUCOPHAGE) 500 MG tablet; Take 1 tablet (500 mg total) by mouth daily with breakfast. Then increase to 2 tabs twice daily in 1 week  Dispense: 90 tablet; Refill: 3  2. Dizziness Much improve with decreased dose of metformin.  Patient was given the opportunity to ask questions.  Patient verbalized understanding of the plan and was able to repeat key elements of the plan.  [employer] interpreter used during this encounter. #[id number]  No orders of the defined types were placed in this encounter.    Requested Prescriptions   Signed Prescriptions Disp Refills  . metFORMIN (GLUCOPHAGE) 500 MG tablet 90 tablet 3    Sig: Take 1 tablet (500 mg total) by mouth daily with breakfast. Then increase to 2 tabs twice daily in 1 week    Return in about 4 months (around  12/30/2018).  Karle Plumber, MD, FACP

## 2018-10-29 ENCOUNTER — Other Ambulatory Visit (HOSPITAL_COMMUNITY): Payer: Self-pay | Admitting: *Deleted

## 2018-10-29 DIAGNOSIS — N6310 Unspecified lump in the right breast, unspecified quadrant: Secondary | ICD-10-CM

## 2018-11-09 ENCOUNTER — Ambulatory Visit: Payer: No Typology Code available for payment source

## 2018-11-22 ENCOUNTER — Encounter (HOSPITAL_COMMUNITY): Payer: Self-pay

## 2018-11-22 ENCOUNTER — Ambulatory Visit
Admission: RE | Admit: 2018-11-22 | Discharge: 2018-11-22 | Disposition: A | Discharge status: Home / Self Care | Payer: No Typology Code available for payment source | Source: Ambulatory Visit | Attending: Obstetrics and Gynecology | Admitting: Obstetrics and Gynecology

## 2018-11-22 ENCOUNTER — Ambulatory Visit (HOSPITAL_COMMUNITY)
Admission: RE | Admit: 2018-11-22 | Discharge: 2018-11-22 | Disposition: A | Discharge status: Home / Self Care | Payer: Self-pay | Source: Ambulatory Visit | Attending: Obstetrics and Gynecology | Admitting: Obstetrics and Gynecology

## 2018-11-22 VITALS — BP 128/82 | Wt 181.0 lb

## 2018-11-22 DIAGNOSIS — Z1239 Encounter for other screening for malignant neoplasm of breast: Secondary | ICD-10-CM

## 2018-11-22 DIAGNOSIS — N6310 Unspecified lump in the right breast, unspecified quadrant: Secondary | ICD-10-CM

## 2018-11-22 HISTORY — DX: Type 2 diabetes mellitus without complications: E11.9

## 2018-11-22 NOTE — Patient Instructions (Signed)
Explained breast self awareness with Shaela Matus Escalante. Patient did not need a Pap smear today due to last Pap smear and HPV typing was 11/02/2017. Let her know [facility] will cover Pap smears and HPV typing every 5 years unless has a history of abnormal Pap smears. [facility] for [facility] diagnostic mamm[facility] and right breast ultrasound per recommendation. Appointment scheduled for Thursday, November 22, 2018 at 0920. Patient aware of appointment and will be there. Anabela Matus Escalante verbalized understanding.  Trevyon Swor, Kathaleen Maserhristine Poll, RN 10:13 AM

## 2018-11-22 NOTE — Progress Notes (Signed)
Patient referred to [facility] due to recommending a 37-month follow-up diagnostic mammogram of a right breast mass at 2 o'clock. Last 3-month right breast diagnostic mammogram completed 05/08/2018 and last bilateral diagnostic mammogram completed 11/02/2017.  Complaints of left breast nipple pain that comes and goes. Patient rates that pain at a 4 out of 10.  Pap Smear: Pap smear not completed today. Last Pap smear was 11/02/2017 at [facility] and normal with negative HPV. Per patient has no history of an abnormal Pap smear. Last Pap smear result is in Epic.  Physical exam: Breasts Breasts symmetrical. No skin abnormalities bilateral breasts. No nipple retraction bilateral breasts. No nipple discharge bilateral breasts. No lymphadenopathy. No lumps palpated bilateral breasts. No complaints of pain or tenderness on exam. Referred patient to the [facility] for a diagnostic mammogram and right breast ultrasound per recommendation. Appointment scheduled for Thursday, November 22, 2018 at 0920.        Pelvic/Bimanual No Pap smear completed today since last Pap smear and HPV typing was 11/02/2017. Pap smear not indicated per [facility] guidelines.   Smoking History: Patient has never smoked.  Patient Navigation: Patient education provided. Access to services provided for patient through [facility] program. Spanish interpreter provided.   Breast and Cervical Cancer Risk Assessment: Patient has a family history of a paternal aunt having breast cancer. Patient has no known genetic mutations or history of radiation treatment to the chest before age 16. Patient has no history of cervical dysplasia, immunocompromised, or DES exposure in-utero.  Risk Assessment    Risk Scores      11/22/2018   Last edited by: Lynnell Dike, LPN   5-year risk: 0.7 %   Lifetime risk: 6.6 %         Used Spanish interpreter Natale Lay from [place].

## 2018-11-23 ENCOUNTER — Encounter (HOSPITAL_COMMUNITY): Payer: Self-pay | Admitting: *Deleted

## 2018-12-13 ENCOUNTER — Ambulatory Visit: Payer: Self-pay | Attending: Family Medicine

## 2018-12-14 MED FILL — TRUE METRIX TEST STRIP: 25 days supply | Qty: 100 | Fill #3

## 2018-12-14 MED FILL — TRUEplus LANCETS 28G MISC: 25 days supply | Qty: 100 | Fill #3

## 2018-12-14 MED FILL — ?METFORMIN HCL 500MG TABL: 500 | 30 days supply | Qty: 120 | Fill #4

## 2018-12-31 ENCOUNTER — Ambulatory Visit: Payer: No Typology Code available for payment source | Admitting: Internal Medicine

## 2019-01-10 ENCOUNTER — Ambulatory Visit: Payer: No Typology Code available for payment source | Admitting: Internal Medicine

## 2019-03-05 ENCOUNTER — Ambulatory Visit: Payer: Self-pay | Attending: Internal Medicine | Admitting: Internal Medicine

## 2019-03-05 ENCOUNTER — Other Ambulatory Visit: Payer: Self-pay

## 2019-03-29 ENCOUNTER — Other Ambulatory Visit: Payer: Self-pay | Admitting: Physician Assistant

## 2019-04-01 ENCOUNTER — Other Ambulatory Visit: Payer: Self-pay | Admitting: Physician Assistant

## 2019-04-03 ENCOUNTER — Other Ambulatory Visit: Payer: Self-pay | Admitting: Pharmacist

## 2019-04-03 MED ORDER — TRUEPLUS LANCETS 30G MISC: 0 refills | Status: DC

## 2019-04-03 MED ORDER — TRUE METRIX BLOOD GLUCOSE TEST VI STRP: ORAL_STRIP | 0 refills | Status: DC

## 2019-04-03 MED FILL — TRUEplus LANCETS 28G MISC: 25 days supply | Qty: 100 | Fill #0

## 2019-04-03 MED FILL — TRUE METRIX TEST STRIP: 30 days supply | Qty: 100 | Fill #0

## 2019-05-09 ENCOUNTER — Other Ambulatory Visit: Payer: Self-pay

## 2019-05-09 ENCOUNTER — Ambulatory Visit: Payer: Self-pay | Admitting: Internal Medicine

## 2019-06-03 ENCOUNTER — Encounter: Payer: Self-pay | Admitting: Internal Medicine

## 2019-06-03 ENCOUNTER — Other Ambulatory Visit: Payer: Self-pay

## 2019-06-03 ENCOUNTER — Ambulatory Visit: Payer: Self-pay | Attending: Internal Medicine | Admitting: Internal Medicine

## 2019-06-03 VITALS — BP 124/80 | HR 57 | Temp 97.7°F | Resp 16 | Ht 64.0 in | Wt 180.8 lb

## 2019-06-03 DIAGNOSIS — Z23 Encounter for immunization: Secondary | ICD-10-CM

## 2019-06-03 DIAGNOSIS — E119 Type 2 diabetes mellitus without complications: Secondary | ICD-10-CM

## 2019-06-03 DIAGNOSIS — Z6831 Body mass index (BMI) 31.0-31.9, adult: Secondary | ICD-10-CM

## 2019-06-03 DIAGNOSIS — K760 Fatty (change of) liver, not elsewhere classified: Secondary | ICD-10-CM

## 2019-06-03 LAB — POCT GLYCOSYLATED HEMOGLOBIN (HGB A1C): HbA1c, POC (prediabetic range): 6.1 % (ref 5.7–6.4)

## 2019-06-03 LAB — GLUCOSE, POCT (MANUAL RESULT ENTRY): POC Glucose: 124 mg/dl — AB (ref 70–99)

## 2019-06-03 MED ORDER — METFORMIN HCL 500 MG PO TABS: 500.0000 mg | ORAL_TABLET | Freq: Every day | ORAL | 3 refills | Status: DC

## 2019-06-03 MED ORDER — TRUE METRIX BLOOD GLUCOSE TEST VI STRP: ORAL_STRIP | 4 refills | Status: DC

## 2019-06-03 MED FILL — ?METFORMIN HCL 500MG TABLET: 500 | 27 days supply | Qty: 90 | Fill #0

## 2019-06-03 MED FILL — TRUE METRIX TEST STRIP: 25 days supply | Qty: 100 | Fill #0

## 2019-06-03 NOTE — Progress Notes (Signed)
Patient ID: Linda Delgado, female    DOB: Sep 02, 1971  MRN: [id number]  CC: routine f/u visit  Subjective: Linda Delgado is a 48 y.o. female who presents for chronic ds management Her concerns today include:  Pt with hx of NAFLD, obesity, DM  DIABETES TYPE 2/Obesity/NAFLD Last A1C:   Results for orders placed or performed in visit on 06/03/19  POCT glucose (manual entry)  Result Value Ref Range   POC Glucose 124 (A) 70 - 99 mg/dl  POCT glycosylated hemoglobin (Hb A1C)  Result Value Ref Range   Hemoglobin A1C     HbA1c POC (<> result, manual entry)     HbA1c, POC (prediabetic range) 6.1 5.7 - 6.4 %   HbA1c, POC (controlled diabetic range)      Med Adherence:  _0  Yes    _1  No Medication side effects:  _2  Yes    _3  No Home Monitoring?  _4  Yes - every morning   Home glucose results range: 130 or lower Diet Adherence: _5  Yes    _6  No Exercise: _7  Yes - runs 5 days a wk    Hypoglycemic episodes?: _8  Yes    _9  No Numbness of the feet? _10  Yes    _11  No Retinopathy hx? _12  Yes    _13  No Last eye exam:  No blurred vision.  Last eye exam was 1 yr ago.  Plans to schedule at [facility] Comments:  Drinks ETOH beverages very rearly.  Patient Active Problem List   Diagnosis Date Noted  . Controlled type 2 diabetes mellitus without complication, without long-term current use of insulin ([place]) 04/13/2018  . Mastalgia in female 02/23/2017  . NAFLD (nonalcoholic fatty liver disease) 02/23/2017  . Class 1 obesity due to excess calories without serious comorbidity with body mass index (BMI) of 30.0 to 30.9 in adult 02/23/2017  . Thyroid activity decreased 11/13/2014  . Menorrhagia 04/02/2014     Current Outpatient Medications on File Prior to Visit  Medication Sig Dispense Refill  . Blood Glucose Monitoring Suppl (TRUE METRIX METER) w/Device KIT 1 each by Does not apply route 3 (three) times daily. (Patient not taking: Reported on 08/10/2018) 1 kit 0  .  diclofenac sodium (VOLTAREN) 1 % GEL Apply 2 g topically 4 (four) times daily. (Patient not taking: Reported on 11/22/2018) 100 g 0  . glucose blood (TRUE METRIX BLOOD GLUCOSE TEST) test strip Use as instructed 100 each 0  . metFORMIN (GLUCOPHAGE) 500 MG tablet Take 1 tablet (500 mg total) by mouth daily with breakfast. Then increase to 2 tabs twice daily in 1 week 90 tablet 3  . Multiple Vitamin (MULTIVITAMIN) tablet Take 1 tablet by mouth daily.    Marland Kitchen omega-3 acid ethyl esters (LOVAZA) 1 G capsule Take by mouth 2 (two) times daily.    . TRUEplus Lancets 30G MISC Use as directed 100 each 0   No current facility-administered medications on file prior to visit.     No Known Allergies  Social History   Socioeconomic History  . Marital status: Married    Spouse name: Not on file  . Number of children: 3  . Years of education: Not on file  . Highest education level: Bachelor's degree (e.g., BA, AB, BS)  Occupational History  . Not on file  Social Needs  . Financial resource strain: Not on file  . Food insecurity    Worry: Not on file    Inability: Not on file  . Transportation  needs    Medical: Yes    Non-medical: Yes  Tobacco Use  . Smoking status: Never Smoker  . Smokeless tobacco: Never Used  Substance and Sexual Activity  . Alcohol use: Yes    Comment: rarely  . Drug use: No  . Sexual activity: Yes    Partners: Male    Birth control/protection: None  Lifestyle  . Physical activity    Days per week: 4 days    Minutes per session: 60 min  . Stress: To some extent  Relationships  . Social Herbalist on phone: Never    Gets together: Twice a week    Attends religious service: More than 4 times per year    Active member of club or organization: Yes    Attends meetings of clubs or organizations: More than 4 times per year    Relationship status: Married  . Intimate partner violence    Fear of current or ex partner: No    Emotionally abused: No    Physically  abused: No    Forced sexual activity: No  Other Topics Concern  . Not on file  Social History Narrative   Patient doesn't like to talk on the phone    Family History  Problem Relation Age of Onset  . Diabetes Father   . Hypertension Father   . Stroke Father   . Hypertension Mother   . Cancer Paternal Grandmother        stomach  . Breast cancer Paternal Aunt     Past Surgical History:  Procedure Laterality Date  . [address], 2007  . WISDOM TOOTH EXTRACTION      ROS: Review of Systems Negative except as stated above  PHYSICAL EXAM: BP 124/80   Pulse (!) 57   Temp 97.7 F (36.5 C) (Oral)   Resp 16   Ht _0  (1.626 m)   Wt 180 lb 12.8 oz (82 kg)   SpO2 95%   BMI 31.03 kg/m   Wt Readings from Last 3 Encounters:  06/03/19 180 lb 12.8 oz (82 kg)  11/22/18 181 lb (82.1 kg)  08/31/18 173 lb 6.4 oz (78.7 kg)    Physical Exam  General appearance - alert, well appearing, and in no distress Mental status - normal mood, behavior, speech, dress, motor activity, and thought processes Eyes - pupils equal and reactive, extraocular eye movements intact Nose - normal and patent, no erythema, discharge or polyps Mouth - mucous membranes moist, pharynx normal without lesions Neck - supple, no significant adenopathy Chest - clear to auscultation, no wheezes, rales or rhonchi, symmetric air entry Heart - normal rate, regular rhythm, normal S1, S2, no murmurs, rubs, clicks or gallops Extremities - peripheral pulses normal, no pedal edema, no clubbing or cyanosis Diabetic Foot Exam - Simple   Simple Foot Form Visual Inspection See comments: Yes Sensation Testing Intact to touch and monofilament testing bilaterally: Yes Pulse Check Posterior Tibialis and Dorsalis pulse intact bilaterally: Yes Comments Non-inflammed bunions both big toe.      CMP Latest Ref Rng & Units 04/13/2018 03/14/2018 02/23/2017  Glucose 65 - 99 mg/dL - 242(H) 134(H)  BUN 6 - 24 mg/dL  - 9 10  Creatinine 0.57 - 1.00 mg/dL - 0.57 0.67  Sodium 134 - 144 mmol/L - 135 137  Potassium 3.5 - 5.2 mmol/L - 4.1 4.2  Chloride 96 - 106 mmol/L - 98 98  CO2 20 - 29 mmol/L - 21 21  Calcium 8.7 - 10.2 mg/dL - 8.9 9.1  Total Protein 6.0 - 8.5 g/dL 7.1 7.0 7.5  Total Bilirubin 0.0 - 1.2 mg/dL 0.5 0.5 0.3  Alkaline Phos 39 - 117 IU/L 82 130(H) 99  AST 0 - 40 IU/L 45(H) 113(H) 59(H)  ALT 0 - 32 IU/L 64(H) 125(H) 87(H)   Lipid Panel     Component Value Date/Time   CHOL 161 04/13/2018 0949   TRIG 120 04/13/2018 0949   HDL 59 04/13/2018 0949   CHOLHDL 2.7 04/13/2018 0949   CHOLHDL 3.8 02/24/2014 0900   VLDL 33 02/24/2014 0900   LDLCALC 78 04/13/2018 0949    CBC    Component Value Date/Time   WBC 8.2 08/10/2018 1137   WBC 6.0 11/13/2014 1137   RBC 4.41 08/10/2018 1137   RBC 4.23 11/13/2014 1137   HGB 12.6 08/10/2018 1137   HCT 37.6 08/10/2018 1137   PLT 242 08/10/2018 1137   MCV 85 08/10/2018 1137   MCH 28.6 08/10/2018 1137   MCH 28.6 11/13/2014 1137   MCHC 33.5 08/10/2018 1137   MCHC 34.0 11/13/2014 1137   RDW 12.9 08/10/2018 1137   LYMPHSABS 2.5 11/13/2014 1137   MONOABS 0.4 11/13/2014 1137   EOSABS 0.2 11/13/2014 1137   BASOSABS 0.0 11/13/2014 1137    ASSESSMENT AND PLAN: 1. Controlled type 2 diabetes mellitus without complication, without long-term current use of insulin ([place]) 2. BMI 31.0-31.9,adult -Commended her on regular exercise.  Encouraged her to get in about 150 minutes/week of moderate intensity exercise.  I spent some time with dietary counseling. -Continue metformin - POCT glucose (manual entry) - POCT glycosylated hemoglobin (Hb A1C) - Microalbumin / creatinine urine ratio - Lipid panel - Comprehensive metabolic panel - CBC - metFORMIN (GLUCOPHAGE) 500 MG tablet; Take 1 tablet (500 mg total) by mouth daily with breakfast. Then increase to 2 tabs twice daily in 1 week  Dispense: 90 tablet; Refill: 3 - glucose blood (TRUE METRIX BLOOD GLUCOSE TEST)  test strip; Use as instructed  Dispense: 100 each; Refill: 4  3. NAFLD (nonalcoholic fatty liver disease) Discussed the importance of weight loss through healthy eating habits and regular exercise.  4. Need for influenza vaccination Given  Patient was given the opportunity to ask questions.  Patient verbalized understanding of the plan and was able to repeat key elements of the plan.  [employer] interpreter used during this encounter. #[id number]  Orders Placed This Encounter  Procedures  . Microalbumin / creatinine urine ratio  . POCT glucose (manual entry)  . POCT glycosylated hemoglobin (Hb A1C)     Requested Prescriptions    No prescriptions requested or ordered in this encounter    No follow-ups on file.  Karle Plumber, MD, FACP

## 2019-06-04 LAB — CBC
Hematocrit: 40 % (ref 34.0–46.6)
Hemoglobin: 13.3 g/dL (ref 11.1–15.9)
MCH: 28.8 pg (ref 26.6–33.0)
MCHC: 33.3 g/dL (ref 31.5–35.7)
MCV: 87 fL (ref 79–97)
Platelets: 224 10*3/uL (ref 150–450)
RBC: 4.62 x10E6/uL (ref 3.77–5.28)
RDW: 12.8 % (ref 11.7–15.4)
WBC: 9.5 10*3/uL (ref 3.4–10.8)

## 2019-06-04 LAB — COMPREHENSIVE METABOLIC PANEL
ALT: 16 IU/L (ref 0–32)
AST: 19 IU/L (ref 0–40)
Albumin/Globulin Ratio: 2 (ref 1.2–2.2)
Albumin: 4.7 g/dL (ref 3.8–4.8)
Alkaline Phosphatase: 79 IU/L (ref 39–117)
BUN/Creatinine Ratio: 13 (ref 9–23)
BUN: 9 mg/dL (ref 6–24)
Bilirubin Total: 0.5 mg/dL (ref 0.0–1.2)
CO2: 26 mmol/L (ref 20–29)
Calcium: 9.2 mg/dL (ref 8.7–10.2)
Chloride: 99 mmol/L (ref 96–106)
Creatinine, Ser: 0.7 mg/dL (ref 0.57–1.00)
GFR calc Af Amer: 118 mL/min/{1.73_m2} (ref >59)
GFR calc non Af Amer: 102 mL/min/{1.73_m2} (ref >59)
Globulin, Total: 2.4 g/dL (ref 1.5–4.5)
Glucose: 120 mg/dL — ABNORMAL HIGH (ref 65–99)
Potassium: 4.4 mmol/L (ref 3.5–5.2)
Sodium: 138 mmol/L (ref 134–144)
Total Protein: 7.1 g/dL (ref 6.0–8.5)

## 2019-06-04 LAB — LIPID PANEL
Chol/HDL Ratio: 3.7 ratio (ref 0.0–4.4)
Cholesterol, Total: 220 mg/dL — ABNORMAL HIGH (ref 100–199)
HDL: 59 mg/dL (ref >39)
LDL Calculated: 118 mg/dL — ABNORMAL HIGH (ref 0–99)
Triglycerides: 214 mg/dL — ABNORMAL HIGH (ref 0–149)
VLDL Cholesterol Cal: 43 mg/dL — ABNORMAL HIGH (ref 5–40)

## 2019-06-04 LAB — MICROALBUMIN / CREATININE URINE RATIO
Creatinine, Urine: 74.4 mg/dL
Microalb/Creat Ratio: 7 mg/g creat (ref 0–29)
Microalbumin, Urine: 5 ug/mL

## 2019-06-05 ENCOUNTER — Other Ambulatory Visit: Payer: Self-pay | Admitting: Internal Medicine

## 2019-06-05 ENCOUNTER — Telehealth: Payer: Self-pay

## 2019-06-05 DIAGNOSIS — E785 Hyperlipidemia, unspecified: Secondary | ICD-10-CM | POA: Insufficient documentation

## 2019-06-05 DIAGNOSIS — E1169 Type 2 diabetes mellitus with other specified complication: Secondary | ICD-10-CM

## 2019-06-05 MED ORDER — ATORVASTATIN CALCIUM 10 MG PO TABS: 10.0000 mg | ORAL_TABLET | Freq: Every day | ORAL | 3 refills | Status: DC

## 2019-06-05 MED FILL — ?ATORVASTATIN 10 MG TABLET: 10 | 90 days supply | Qty: 90 | Fill #0

## 2019-06-05 NOTE — Telephone Encounter (Signed)
[employer] interpreters Remo Lipps  Id# [id number]  contacted pt to go over lab results pt didn't answer left a detailed vm informing pt of results and if she has any questions or concerns to give me a call

## 2019-07-09 ENCOUNTER — Other Ambulatory Visit: Payer: Self-pay

## 2019-07-09 ENCOUNTER — Ambulatory Visit: Payer: Self-pay | Attending: Internal Medicine

## 2019-07-19 ENCOUNTER — Telehealth: Payer: Self-pay | Admitting: *Deleted

## 2019-07-19 NOTE — Telephone Encounter (Signed)
Patient came to office during lunch along with her son to report having a pain in muscles. She noticed after taking the cholesterol medication. She takes atorvastatin. Please advise. Patient prefers if medication is sent to this pharmacy.     Also made an appointment for patient to address anxiety on 08/19/2019.

## 2019-07-22 NOTE — Telephone Encounter (Signed)
Spanish interpreter used to call patient- Linda Delgado, [phone] Left message on voicemail to return call.  

## 2019-07-24 NOTE — Telephone Encounter (Signed)
Patient aware of message and recommendations per Dr. Wynetta Emery. She states she will try this. Encouraged to call if she continued to experience muscle aches while on this regimen. Patient verbalized understanding.

## 2019-08-19 ENCOUNTER — Other Ambulatory Visit: Payer: Self-pay

## 2019-08-19 ENCOUNTER — Ambulatory Visit: Payer: No Typology Code available for payment source | Admitting: Licensed Clinical Social Worker

## 2019-08-19 ENCOUNTER — Ambulatory Visit: Payer: Self-pay | Attending: Internal Medicine | Admitting: Internal Medicine

## 2019-08-19 ENCOUNTER — Encounter: Payer: Self-pay | Admitting: Internal Medicine

## 2019-08-19 VITALS — BP 118/79 | HR 69 | Temp 98.8°F | Resp 16 | Wt 176.6 lb

## 2019-08-19 DIAGNOSIS — F411 Generalized anxiety disorder: Secondary | ICD-10-CM

## 2019-08-19 DIAGNOSIS — Z1331 Encounter for screening for depression: Secondary | ICD-10-CM

## 2019-08-19 DIAGNOSIS — E782 Mixed hyperlipidemia: Secondary | ICD-10-CM

## 2019-08-19 DIAGNOSIS — F410 Panic disorder [episodic paroxysmal anxiety] without agoraphobia: Secondary | ICD-10-CM

## 2019-08-19 MED ORDER — PRAVASTATIN SODIUM 10 MG PO TABS: ORAL_TABLET | ORAL | 1 refills | Status: DC

## 2019-08-19 MED ORDER — HYDROXYZINE PAMOATE 25 MG PO CAPS: 25.0000 mg | ORAL_CAPSULE | Freq: Every day | ORAL | 1 refills | Status: DC | PRN

## 2019-08-19 MED FILL — hydrOXYzine PAMOATE 25 MG C: 25 | 30 days supply | Qty: 30 | Fill #0

## 2019-08-19 MED FILL — PRAVASTATIN SODIUM 10 MG TA: 10 | 28 days supply | Qty: 12 | Fill #0

## 2019-08-19 NOTE — Progress Notes (Signed)
Patient ID: Linda Delgado, female    DOB: September 10, 1971  MRN: [id number]  CC: Anxiety   Subjective: Linda Delgado is a 48 y.o. female who presents for UC visit due to anxiety.  Dorita from [facility] is with her and interprets Her concerns today include:  Pt with hx of NAFLD, obesity, DM  Pt reports feelings of anxiety since a young age. Never on med Never old me about it because she always felt she could control it and was controlling it until recent [place] pandemic.  She feels anxiety is worse during the pandemic because of being socially isolated Had a bad attack about 3 wks ago that kept her feeling anxious and on edge for several days after.  Felt like she was dying.  Symptoms include feeling SOB, palpitations.  Had eye exam at [place] 1 mth ago  HL:  Stopped Lipitor 1 mh ago because of muscle pain.  Patient Active Problem List   Diagnosis Date Noted  . Hyperlipidemia associated with type 2 diabetes mellitus ([place]) 06/05/2019  . Controlled type 2 diabetes mellitus without complication, without long-term current use of insulin ([place]) 04/13/2018  . Mastalgia in female 02/23/2017  . NAFLD (nonalcoholic fatty liver disease) 02/23/2017  . Class 1 obesity due to excess calories without serious comorbidity with body mass index (BMI) of 30.0 to 30.9 in adult 02/23/2017  . Thyroid activity decreased 11/13/2014  . Menorrhagia 04/02/2014     Current Outpatient Medications on File Prior to Visit  Medication Sig Dispense Refill  . Blood Glucose Monitoring Suppl (TRUE METRIX METER) w/Device KIT 1 each by Does not apply route 3 (three) times daily. (Patient not taking: Reported on 08/10/2018) 1 kit 0  . glucose blood (TRUE METRIX BLOOD GLUCOSE TEST) test strip Use as instructed 100 each 4  . metFORMIN (GLUCOPHAGE) 500 MG tablet Take 1 tablet (500 mg total) by mouth daily with breakfast. Then increase to 2 tabs twice daily in 1 week 90 tablet 3  . Multiple Vitamin  (MULTIVITAMIN) tablet Take 1 tablet by mouth daily.    Marland Kitchen omega-3 acid ethyl esters (LOVAZA) 1 G capsule Take by mouth 2 (two) times daily.    . TRUEplus Lancets 30G MISC Use as directed 100 each 0   No current facility-administered medications on file prior to visit.     Allergies  Allergen Reactions  . Atorvastatin Other (See Comments)    Muscle pain    Social History   Socioeconomic History  . Marital status: Married    Spouse name: Not on file  . Number of children: 3  . Years of education: Not on file  . Highest education level: Bachelor's degree (e.g., BA, AB, BS)  Occupational History  . Not on file  Social Needs  . Financial resource strain: Not on file  . Food insecurity    Worry: Not on file    Inability: Not on file  . Transportation needs    Medical: Yes    Non-medical: Yes  Tobacco Use  . Smoking status: Never Smoker  . Smokeless tobacco: Never Used  Substance and Sexual Activity  . Alcohol use: Yes    Comment: rarely  . Drug use: No  . Sexual activity: Yes    Partners: Male    Birth control/protection: None  Lifestyle  . Physical activity    Days per week: 4 days    Minutes per session: 60 min  . Stress: To some extent  Relationships  . Social  connections    Talks on phone: Never    Gets together: Twice a week    Attends religious service: More than 4 times per year    Active member of club or organization: Yes    Attends meetings of clubs or organizations: More than 4 times per year    Relationship status: Married  . Intimate partner violence    Fear of current or ex partner: No    Emotionally abused: No    Physically abused: No    Forced sexual activity: No  Other Topics Concern  . Not on file  Social History Narrative   Patient doesn't like to talk on the phone    Family History  Problem Relation Age of Onset  . Diabetes Father   . Hypertension Father   . Stroke Father   . Hypertension Mother   . Cancer Paternal Grandmother         stomach  . Breast cancer Paternal Aunt     Past Surgical History:  Procedure Laterality Date  . [place], 2007  . WISDOM TOOTH EXTRACTION      ROS: Review of Systems Negative except as stated above  PHYSICAL EXAM: BP 118/79   Pulse 69   Temp 98.8 F (37.1 C) (Oral)   Resp 16   Wt 176 lb 9.6 oz (80.1 kg)   SpO2 95%   BMI 30.31 kg/m   Physical Exam  General appearance - alert, well appearing, middle-aged Hispanic female and in no distress Mental status - normal mood, behavior, speech, dress, motor activity, and thought processes Neck: No thyroid enlargement or nodules Chest - clear to auscultation, no wheezes, rales or rhonchi, symmetric air entry Heart - normal rate, regular rhythm, normal S1, S2, no murmurs, rubs, clicks or gallops Extremities - peripheral pulses normal, no pedal edema, no clubbing or cyanosis GAD 7 : Generalized Anxiety Score 08/19/2019 06/03/2019 08/31/2018 08/10/2018  Nervous, Anxious, on Edge 1 0 0 0  Control/stop worrying 1 0 0 0  Worry too much - different things 1 0 0 0  Trouble relaxing 1 0 0 0  Restless 1 0 0 0  Easily annoyed or irritable 1 0 0 0  Afraid - awful might happen 0 - 0 0  Total GAD 7 Score 6 - 0 0  Anxiety Difficulty - - - -    Depression screen [facility] 2/9 08/19/2019 06/03/2019 08/31/2018  Decreased Interest 1 0 1  Down, Depressed, Hopeless 0 0 1  PHQ - 2 Score 1 0 2  Altered sleeping - - 0  Tired, decreased energy - - 1  Change in appetite - - 0  Feeling bad or failure about yourself  - - 0  Trouble concentrating - - 0  Moving slowly or fidgety/restless - - 0  Suicidal thoughts - - 0  PHQ-9 Score - - 3     CMP Latest Ref Rng & Units 06/03/2019 04/13/2018 03/14/2018  Glucose 65 - 99 mg/dL 120(H) - 242(H)  BUN 6 - 24 mg/dL 9 - 9  Creatinine 0.57 - 1.00 mg/dL 0.70 - 0.57  Sodium 134 - 144 mmol/L 138 - 135  Potassium 3.5 - 5.2 mmol/L 4.4 - 4.1  Chloride 96 - 106 mmol/L 99 - 98  CO2 20 - 29 mmol/L 26 - 21   Calcium 8.7 - 10.2 mg/dL 9.2 - 8.9  Total Protein 6.0 - 8.5 g/dL 7.1 7.1 7.0  Total Bilirubin 0.0 - 1.2 mg/dL 0.5 0.5 0.5  Alkaline Phos 39 - 117 IU/L 79 82 130(H)  AST 0 - 40 IU/L 19 45(H) 113(H)  ALT 0 - 32 IU/L 16 64(H) 125(H)   Lipid Panel     Component Value Date/Time   CHOL 220 (H) 06/03/2019 1147   TRIG 214 (H) 06/03/2019 1147   HDL 59 06/03/2019 1147   CHOLHDL 3.7 06/03/2019 1147   CHOLHDL 3.8 02/24/2014 0900   VLDL 33 02/24/2014 0900   LDLCALC 118 (H) 06/03/2019 1147    CBC    Component Value Date/Time   WBC 9.5 06/03/2019 1147   WBC 6.0 11/13/2014 1137   RBC 4.62 06/03/2019 1147   RBC 4.23 11/13/2014 1137   HGB 13.3 06/03/2019 1147   HCT 40.0 06/03/2019 1147   PLT 224 06/03/2019 1147   MCV 87 06/03/2019 1147   MCH 28.8 06/03/2019 1147   MCH 28.6 11/13/2014 1137   MCHC 33.3 06/03/2019 1147   MCHC 34.0 11/13/2014 1137   RDW 12.8 06/03/2019 1147   LYMPHSABS 2.5 11/13/2014 1137   MONOABS 0.4 11/13/2014 1137   EOSABS 0.2 11/13/2014 1137   BASOSABS 0.0 11/13/2014 1137    ASSESSMENT AND PLAN: 1. Generalized anxiety disorder Patient was seen by LCSW today and was taught some coping skills which she found helpful.  She is interested in trying medication to use as needed rather than every day.  She is agreeable to trying hydroxyzine.  Patient advised that the medication can cause drowsiness - hydrOXYzine (VISTARIL) 25 MG capsule; Take 1 capsule (25 mg total) by mouth daily as needed for anxiety.  Dispense: 30 capsule; Refill: 1  2. Mixed hyperlipidemia Stop Lipitor.  We will try her with Pravachol initially taking it only 3 days a week - pravastatin (PRAVACHOL) 10 MG tablet; Take one tab PO three days a wk at bedtime.  Dispense: 30 tablet; Refill: 1    Patient was given the opportunity to ask questions.  Patient verbalized understanding of the plan and was able to repeat key elements of the plan.   No orders of the defined types were placed in this encounter.     Requested Prescriptions   Signed Prescriptions Disp Refills  . pravastatin (PRAVACHOL) 10 MG tablet 30 tablet 1    Sig: Take one tab PO three days a wk at bedtime.  . hydrOXYzine (VISTARIL) 25 MG capsule 30 capsule 1    Sig: Take 1 capsule (25 mg total) by mouth daily as needed for anxiety.    No follow-ups on file.  Karle Plumber, MD, FACP

## 2019-08-21 NOTE — BH Specialist Note (Addendum)
Integrated Behavioral Health Initial Visit  MRN: [id number] Name: Linda Delgado  Number of [place] Clinician visits:: 1/6 Session Start time: 3:30pm  Session End time: 4:00pm Total time: 30  Type of Service: [place] Interpretor:Yes.   Interpretor Name and Language: Hotel manager, Donita A from [employer] Completed.       SUBJECTIVE: Linda Delgado is a 48 y.o. female accompanied by self Patient was referred by Dr. Wynetta Emery for symptoms of anxiety. Patient reports the following symptoms/concerns: Pt reports increased feelings of anxiety over the past 3 weeks marked by self reported panic attacks Duration of problem: [place]; Severity of problem: mild  OBJECTIVE: Mood: Anxious and Affect: Appropriate Risk of harm to self or others: No plan to harm self or others  LIFE CONTEXT: Family and Social: Pt is married and lives at home with spouse. Pt has 3 adult children who do not live in the home. Pt's mother and additional family members live in [place]. School/Work: Pt cares for home full time.  Self-Care: Pt exercises 5 days/week. Pt enjoys cooking and spends time with friends from church community. Life Changes: Pt reports she has been more isolated than typical due to Covid-19. Pt reports she worries about her mother who lives in [place] but otherwise she has a happy life with no major concerns.   STRENGTHS: Pt exercises regularly Pt has good insight Pt has social supports through her family and church community   GOALS ADDRESSED: Patient will: 1. Reduce symptoms of: anxiety 2. Increase knowledge and/or ability of: coping skills and stress reduction  3. Demonstrate ability to: Increase healthy adjustment to current life circumstances  INTERVENTIONS: Interventions utilized: Mindfulness or Psychologist, educational, Supportive Counseling and Psychoeducation and/or Health Education   Standardized Assessments completed: GAD-7 and PHQ 2&9  ASSESSMENT: Patient currently experiencing increased feeling of anxiety as evidenced by feelings of "desperation", reporting she "can't breathe like usual" and moments of crying after experiencing panic attacks. Pt reports she has experienced sx of anxiety including panic attacks throughout her life and she has been able to control/manage sx with exercise and socialization. Pt reports since 3 weeks ago, she has noticed increasing feelings of panic that have been increasingly difficult to manage. Pt reports her son also experiences anxiety. Pt reports she is not interested in long term medication, but requested a PRN anxiolytic to help her during moments of intense panic. Pt reports exercise and spending time outside are especially helpful reducing sx of anxiety but have been less effective over the past 3 weeks.   Patient may benefit from psychotherapy, relaxation exercises, and medication management. Pt reports she is not currently interested in psychotherapy. MSW Intern provided pt with handouts that provide psycho education about anxiety and several relaxation exercises including grounding techniques with the 5 senses, progressive muscle relaxation, and deep breathing. MSW Intern commended pt for her exercise regimen and encouraged her to continue exercise and social support interaction. MSW intern additionally normalized feelings of anxiety with psycho education.     PLAN: 1. Follow up with behavioral health clinician on : Pt encouraged to contact MSW Intern or LCSW if additional support is needed or symptoms continue. 2. Behavioral recommendations: Medication management, relaxation strategies, use of coping skills discussed in session.  3. Referral(s): Therapy offered but pt declined  4. "From scale of 1-10, how likely are you to follow plan?":   Berniece Salines  MSW Intern 08/21/2019  9:22am

## 2019-09-11 ENCOUNTER — Other Ambulatory Visit: Payer: Self-pay | Admitting: Internal Medicine

## 2019-09-11 MED FILL — TRUEplus LANCETS 28G MISC: 25 days supply | Qty: 100 | Fill #0

## 2019-09-11 MED FILL — TRUE METRIX TEST STRIP: 25 days supply | Qty: 100 | Fill #1

## 2019-09-11 MED FILL — ?METFORMIN HCL 500MG TABLET: 500 | 22 days supply | Qty: 90 | Fill #1

## 2019-10-07 ENCOUNTER — Ambulatory Visit: Payer: No Typology Code available for payment source | Admitting: Internal Medicine

## 2019-10-09 ENCOUNTER — Ambulatory Visit: Payer: Self-pay | Attending: Internal Medicine | Admitting: Internal Medicine

## 2019-10-09 ENCOUNTER — Encounter: Payer: Self-pay | Admitting: Internal Medicine

## 2019-10-09 ENCOUNTER — Other Ambulatory Visit: Payer: Self-pay

## 2019-10-09 DIAGNOSIS — E782 Mixed hyperlipidemia: Secondary | ICD-10-CM

## 2019-10-09 DIAGNOSIS — E119 Type 2 diabetes mellitus without complications: Secondary | ICD-10-CM

## 2019-10-09 DIAGNOSIS — F411 Generalized anxiety disorder: Secondary | ICD-10-CM

## 2019-10-09 MED ORDER — PRAVASTATIN SODIUM 10 MG PO TABS: ORAL_TABLET | ORAL | 6 refills | Status: DC

## 2019-10-09 MED FILL — PRAVASTATIN SODIUM 10 MG TA: 10 | 30 days supply | Qty: 30 | Fill #0

## 2019-10-09 MED FILL — TRUEplus LANCETS 28G MISC: 25 days supply | Qty: 100 | Fill #0

## 2019-10-09 NOTE — Progress Notes (Signed)
Virtual Visit via Telephone Note Due to current restrictions/limitations of in-office visits due to the COVID-19 pandemic, this scheduled clinical appointment was converted to a telehealth visit  I connected with Linda Delgado on 10/09/19 at 9:34 a.m EST by telephone and verified that I am speaking with the correct person using two identifiers. I am in my office.  The patient is at home.  Only the patient, myself and [name] from [employer] [phone]) participated in this encounter.  I discussed the limitations, risks, security and privacy concerns of performing an evaluation and management service by telephone and the availability of in person appointments. I also discussed with the patient that there may be a patient responsible charge related to this service. The patient expressed understanding and agreed to proceed.   History of Present Illness: Pt with hx of NAFLD, obesity, DM, HL, anxiety.  Last visit 08/19/2019.  On last visit, patient complained of feeling very anxious especially with the ongoing COVID pandemic.  She meet with the LCSW and was taught some coping skills.  She was started on Hydroxyzine to use PRN.  Pt states she has not had to use the medication because she has been doing well.  HL:  On last visit Lipitor was d/c due to muscle pain.  Started on Pravastatin 3 x a wk instead.  She is tolerating it well  DM:  BS this a.m was 120.  Checks once a day in mornings.  Usually less than 130.  "I'm taking care of myself trying to eat healthier and doing some exercise."  Runs on treadmill 4-5 x a wk and also uses a bicycle.    Outpatient Encounter Medications as of 10/09/2019  Medication Sig  . Blood Glucose Monitoring Suppl (TRUE METRIX METER) w/Device KIT 1 each by Does not apply route 3 (three) times daily. (Patient not taking: Reported on 08/10/2018)  . glucose blood (TRUE METRIX BLOOD GLUCOSE TEST) test strip Use as instructed  . hydrOXYzine (VISTARIL) 25 MG  capsule Take 1 capsule (25 mg total) by mouth daily as needed for anxiety.  . metFORMIN (GLUCOPHAGE) 500 MG tablet Take 1 tablet (500 mg total) by mouth daily with breakfast. Then increase to 2 tabs twice daily in 1 week  . Multiple Vitamin (MULTIVITAMIN) tablet Take 1 tablet by mouth daily.  Marland Kitchen omega-3 acid ethyl esters (LOVAZA) 1 G capsule Take by mouth 2 (two) times daily.  . pravastatin (PRAVACHOL) 10 MG tablet Take one tab PO three days a wk at bedtime.  . TRUEplus Lancets 28G MISC USE AS DIRECTED   No facility-administered encounter medications on file as of 10/09/2019.      Observations/Objective: Results for orders placed or performed in visit on 06/03/19  Microalbumin / creatinine urine ratio  Result Value Ref Range   Creatinine, Urine 74.4 Not Estab. mg/dL   Microalbumin, Urine 5.0 Not Estab. ug/mL   Microalb/Creat Ratio 7 0 - 29 mg/g creat  Lipid panel  Result Value Ref Range   Cholesterol, Total 220 (H) 100 - 199 mg/dL   Triglycerides 214 (H) 0 - 149 mg/dL   HDL 59 >39 mg/dL   VLDL Cholesterol Cal 43 (H) 5 - 40 mg/dL   LDL Calculated 118 (H) 0 - 99 mg/dL   Chol/HDL Ratio 3.7 0.0 - 4.4 ratio  Comprehensive metabolic panel  Result Value Ref Range   Glucose 120 (H) 65 - 99 mg/dL   BUN 9 6 - 24 mg/dL   Creatinine, Ser 0.70 0.57 - 1.00  mg/dL   GFR calc non Af Amer 102 >59 mL/min/1.73   GFR calc Af Amer 118 >59 mL/min/1.73   BUN/Creatinine Ratio 13 9 - 23   Sodium 138 134 - 144 mmol/L   Potassium 4.4 3.5 - 5.2 mmol/L   Chloride 99 96 - 106 mmol/L   CO2 26 20 - 29 mmol/L   Calcium 9.2 8.7 - 10.2 mg/dL   Total Protein 7.1 6.0 - 8.5 g/dL   Albumin 4.7 3.8 - 4.8 g/dL   Globulin, Total 2.4 1.5 - 4.5 g/dL   Albumin/Globulin Ratio 2.0 1.2 - 2.2   Bilirubin Total 0.5 0.0 - 1.2 mg/dL   Alkaline Phosphatase 79 39 - 117 IU/L   AST 19 0 - 40 IU/L   ALT 16 0 - 32 IU/L  CBC  Result Value Ref Range   WBC 9.5 3.4 - 10.8 x10E3/uL   RBC 4.62 3.77 - 5.28 x10E6/uL   Hemoglobin  13.3 11.1 - 15.9 g/dL   Hematocrit 40.0 34.0 - 46.6 %   MCV 87 79 - 97 fL   MCH 28.8 26.6 - 33.0 pg   MCHC 33.3 31.5 - 35.7 g/dL   RDW 12.8 11.7 - 15.4 %   Platelets 224 150 - 450 x10E3/uL  POCT glucose (manual entry)  Result Value Ref Range   POC Glucose 124 (A) 70 - 99 mg/dl  POCT glycosylated hemoglobin (Hb A1C)  Result Value Ref Range   Hemoglobin A1C     HbA1c POC (<> result, manual entry)     HbA1c, POC (prediabetic range) 6.1 5.7 - 6.4 %   HbA1c, POC (controlled diabetic range)       Assessment and Plan: 1. Controlled type 2 diabetes mellitus without complication, without long-term current use of insulin ([place]) Reported blood sugars are at goal.  Encouraged her to continue healthy eating habits and regular exercise.  Continue Metformin.  2. Generalized anxiety disorder Patient sounds as though she is doing better.  She can use hydroxyzine when needed  3. Mixed hyperlipidemia Tolerating low-dose statin.  We will continue with the 3 times a week of Pravachol for now and consider increasing it to every day on next visit - pravastatin (PRAVACHOL) 10 MG tablet; Take one tab PO three days a wk at bedtime.  Dispense: 30 tablet; Refill: 6   Follow Up Instructions: 4 mths   I discussed the assessment and treatment plan with the patient. The patient was provided an opportunity to ask questions and all were answered. The patient agreed with the plan and demonstrated an understanding of the instructions.   The patient was advised to call back or seek an in-person evaluation if the symptoms worsen or if the condition fails to improve as anticipated.  I provided 11 minutes of non-face-to-face time during this encounter.   Karle Plumber, MD

## 2019-11-01 ENCOUNTER — Other Ambulatory Visit: Payer: Self-pay | Admitting: Family Medicine

## 2019-11-13 ENCOUNTER — Other Ambulatory Visit (HOSPITAL_COMMUNITY): Payer: Self-pay | Admitting: *Deleted

## 2019-11-13 DIAGNOSIS — N644 Mastodynia: Secondary | ICD-10-CM

## 2019-12-12 ENCOUNTER — Ambulatory Visit: Payer: Self-pay | Admitting: Student

## 2019-12-12 ENCOUNTER — Ambulatory Visit
Admission: RE | Admit: 2019-12-12 | Discharge: 2019-12-12 | Disposition: A | Discharge status: Home / Self Care | Payer: No Typology Code available for payment source | Source: Ambulatory Visit | Attending: Obstetrics and Gynecology | Admitting: Obstetrics and Gynecology

## 2019-12-12 ENCOUNTER — Other Ambulatory Visit: Payer: Self-pay | Admitting: Obstetrics and Gynecology

## 2019-12-12 ENCOUNTER — Other Ambulatory Visit: Payer: Self-pay

## 2019-12-12 VITALS — BP 134/80 | Temp 97.1°F | Wt 178.0 lb

## 2019-12-12 DIAGNOSIS — Z1239 Encounter for other screening for malignant neoplasm of breast: Secondary | ICD-10-CM

## 2019-12-12 DIAGNOSIS — N6310 Unspecified lump in the right breast, unspecified quadrant: Secondary | ICD-10-CM

## 2019-12-12 DIAGNOSIS — N644 Mastodynia: Secondary | ICD-10-CM

## 2019-12-12 NOTE — Progress Notes (Signed)
Ms. Linda Delgado is a 49 y.o. female who presents to [facility] today with no complaints . She had a left breast pain 5-6 months ago but it has resolved; no pain in the last 5-6 months.    Pap Smear: Pap not smear completed today. Last Pap smear was [facility] clinic in December 2019 clinic and was normal. Per patient has no history of an abnormal Pap smear. Last Pap smear result is available in Epic.   Physical exam: Breasts Breasts symmetrical. No skin abnormalities bilateral breasts. No nipple retraction bilateral breasts. No nipple discharge bilateral breasts. No lymphadenopathy. No lumps palpated bilateral breasts.       Pelvic/Bimanual Pap is not indicated today    Smoking History: Patient has never smoked     Patient Navigation: Patient education provided. Access to services provided for patient through [facility] program. Spanish interpreter provided. No transportation provided   Colorectal Cancer Screening: Per patient has never had colonoscopy completed No complaints today.    Breast and Cervical Cancer Risk Assessment: Patient has family history of breast cancer, known genetic mutations, or radiation treatment to the chest before age 2. Patient does not have history of cervical dysplasia, immunocompromised, or DES exposure in-utero.  Patient said that she had pain in her left breast 5-6 months ago but is having none now.  Risk Assessment    Risk Scores      12/12/2019 11/22/2018   Last edited by: Narda Rutherford, LPN Stoney Bang H, LPN   5-year risk: 0.7 % 0.7 %   Lifetime risk: 6.5 % 6.6 %          A: [facility] exam without pap smear. Normal breast exam, no lumps, masses, tenderness or discharge.  Complaint of nothing.   P: [facility] for a diagnostic mammogram. Appointment scheduled for this afternoon.   Marylene Land, CNM 12/12/2019 8:54 AM

## 2019-12-23 MED FILL — ?METFORMIN HCL 500MG TABLET: 500 | 22 days supply | Qty: 90 | Fill #2

## 2019-12-23 MED FILL — PRAVASTATIN SODIUM 10 MG TA: 10 | 30 days supply | Qty: 30 | Fill #1

## 2020-02-07 ENCOUNTER — Other Ambulatory Visit: Payer: Self-pay

## 2020-02-07 ENCOUNTER — Encounter: Payer: Self-pay | Admitting: Internal Medicine

## 2020-02-07 ENCOUNTER — Ambulatory Visit: Payer: Self-pay | Attending: Internal Medicine | Admitting: Internal Medicine

## 2020-02-07 VITALS — BP 130/82 | HR 66 | Temp 97.5°F | Resp 16 | Wt 185.2 lb

## 2020-02-07 DIAGNOSIS — R030 Elevated blood-pressure reading, without diagnosis of hypertension: Secondary | ICD-10-CM

## 2020-02-07 DIAGNOSIS — E782 Mixed hyperlipidemia: Secondary | ICD-10-CM

## 2020-02-07 DIAGNOSIS — E669 Obesity, unspecified: Secondary | ICD-10-CM

## 2020-02-07 DIAGNOSIS — Z1211 Encounter for screening for malignant neoplasm of colon: Secondary | ICD-10-CM

## 2020-02-07 DIAGNOSIS — E1169 Type 2 diabetes mellitus with other specified complication: Secondary | ICD-10-CM

## 2020-02-07 LAB — GLUCOSE, POCT (MANUAL RESULT ENTRY): POC Glucose: 135 mg/dl — AB (ref 70–99)

## 2020-02-07 LAB — POCT GLYCOSYLATED HEMOGLOBIN (HGB A1C): HbA1c, POC (prediabetic range): 6.3 % (ref 5.7–6.4)

## 2020-02-07 NOTE — Patient Instructions (Signed)
Hipertensión en los adultos °Hypertension, Adult °La presión arterial alta (hipertensión) se produce cuando la fuerza de la sangre bombea a través de las arterias con mucha fuerza. Las arterias son los vasos sanguíneos que transportan la sangre desde el corazón al resto del cuerpo. La hipertensión hace que el corazón haga más esfuerzo para bombear sangre y puede provocar que las arterias se estrechen o endurezcan. La hipertensión no tratada o no controlada puede causar infarto de miocardio, insuficiencia cardíaca, accidente cerebrovascular, enfermedad renal y otros problemas. °Una lectura de la presión arterial consta de un número más alto sobre un número más bajo. En condiciones ideales, la presión arterial debe estar por debajo de 120/80. El primer número (“superior”) es la presión sistólica. Es la medida de la presión de las arterias cuando el corazón late. El segundo número (“inferior”) es la presión diastólica. Es la medida de la presión en las arterias cuando el corazón se relaja. °¿Cuáles son las causas? °Se desconoce la causa exacta de esta afección. Hay algunas afecciones que causan presión arterial alta o están relacionadas con ella. °¿Qué incrementa el riesgo? °Algunos factores de riesgo de hipertensión están bajo su control. Los siguientes factores pueden hacer que sea más propenso a desarrollar esta afección: °· Fumar. °· Tener diabetes mellitus tipo 2, colesterol alto, o ambos. °· No hacer la cantidad suficiente de actividad física o ejercicio. °· Tener sobrepeso. °· Consumir mucha grasa, azúcar, calorías o sal (sodio) en su dieta. °· Beber alcohol en exceso. °Algunos factores de riesgo para la presión arterial alta pueden ser difíciles o imposibles de cambiar. Algunos de estos factores son los siguientes: °· Tener enfermedad renal crónica. °· Tener antecedentes familiares de presión arterial alta. °· Edad. Los riesgos aumentan con la edad. °· Raza. El riesgo es mayor para las personas  afroamericanas. °· Sexo. Antes de los 45 años, los hombres corren más riesgo que las mujeres. Después de los 65 años, las mujeres corren más riesgo que los hombres. °· Tener apnea obstructiva del sueño. °· Estrés. °¿Cuáles son los signos o los síntomas? °Es posible que la presión arterial alta puede no cause síntomas. La presión arterial muy alta (crisis hipertensiva) puede provocar: °· Dolor de cabeza. °· Ansiedad. °· Falta de aire. °· Hemorragia nasal. °· Náuseas y vómitos. °· Cambios en la visión. °· Dolor de pecho intenso. °· Convulsiones. °¿Cómo se diagnostica? °Esta afección se diagnostica al medir su presión arterial mientras se encuentra sentado, con el brazo apoyado sobre una superficie plana, las piernas sin cruzar y los pies bien apoyados en el piso. El brazalete del tensiómetro debe colocarse directamente sobre la piel de la parte superior del brazo y al nivel de su corazón. Debe medirla al menos dos veces en el mismo brazo. Determinadas condiciones pueden causar una diferencia de presión arterial entre el brazo izquierdo y el derecho. °Ciertos factores pueden provocar que las lecturas de la presión arterial sean inferiores o superiores a lo normal por un período corto de tiempo: °· Si su presión arterial es más alta cuando se encuentra en el consultorio del médico que cuando la mide en su hogar, se denomina “hipertensión de bata blanca”. La mayoría de las personas que tienen esta afección no deben ser medicadas. °· Si su presión arterial es más alta en el hogar que cuando se encuentra en el consultorio del médico, se denomina “hipertensión enmascarada”. La mayoría de las personas que tienen esta afección deben ser medicadas para controlar la presión arterial. °Si tiene una lecturas de presión arterial alta durante   una visita o si tiene presión arterial normal con otros factores de riesgo, se le podrá pedir que haga lo siguiente: °· Que regrese otro día para volver a controlar su presión arterial  nuevamente. °· Que se controle la presión arterial en su casa durante 1 semana o más. °Si se le diagnostica hipertensión, es posible que se le realicen otros análisis de sangre o estudios de diagnóstico por imágenes para ayudar a su médico a comprender su riesgo general de tener otras afecciones. °¿Cómo se trata? °Esta afección se trata haciendo cambios saludables en el estilo de vida, tales como ingerir alimentos saludables, realizar más ejercicio y reducir el consumo de alcohol. El médico puede recetarle medicamentos si los cambios en el estilo de vida no son suficientes para lograr controlar la presión arterial y si: °· Su presión arterial sistólica está por encima de 130. °· Su presión arterial diastólica está por encima de 80. °La presión arterial deseada puede variar en función de las enfermedades, la edad y otros factores personales. °Siga estas instrucciones en su casa: °Comida y bebida ° °· Siga una dieta con alto contenido de fibras y potasio, y con bajo contenido de sodio, azúcar agregada y grasas. Un ejemplo de plan alimenticio es la dieta DASH (Dietary Approaches to Stop Hypertension, Métodos alimenticios para detener la hipertensión). Para alimentarse de esta manera: °? Coma mucha fruta y verdura fresca. Trate de que la mitad del plato de cada comida sea de frutas y verduras. °? Coma cereales integrales, como pasta integral, arroz integral o pan integral. Llene aproximadamente un cuarto del plato con cereales integrales. °? Coma y beba productos lácteos con bajo contenido de grasa, como leche descremada o yogur bajo en grasas. °? Evite la ingesta de cortes de carne grasa, carne procesada o curada, y carne de ave con piel. Llene aproximadamente un cuarto del plato con proteínas magras, como pescado, pollo sin piel, frijoles, huevos o tofu. °? Evite ingerir alimentos prehechos y procesados. En general, estos tienen mayor cantidad de sodio, azúcar agregada y grasa. °· Reduzca su ingesta diaria de sodio.  La mayoría de las personas que tienen hipertensión deben comer menos de 1500 mg de sodio por día. °· No beba alcohol si: °? Su médico le indica no hacerlo. °? Está embarazada, puede estar embarazada o está tratando de quedar embarazada. °· Si bebe alcohol: °? Limite la cantidad que bebe a lo siguiente: °§ De 0 a 1 medida por día para las mujeres. °§ De 0 a 2 medidas por día para los hombres. °? Esté atento a la cantidad de alcohol que hay en las bebidas que toma. En los [place], una medida equivale a una botella de cerveza de 12 oz (355 ml), un vaso de vino de 5 oz (148 ml) o un vaso de una bebida alcohólica de alta graduación de 1½ oz (44 ml). °Estilo de vida ° °· Trabaje con su médico para mantener un peso saludable o perder peso. Pregúntele cuál es el peso recomendado para usted. °· Haga al menos 30 minutos de ejercicio la mayoría de los días de la semana. Estas actividades pueden incluir caminar, nadar o andar en bicicleta. °· Incluya ejercicios para fortalecer sus músculos (ejercicios de resistencia), como Pilates o levantamiento de pesas, como parte de su rutina semanal de ejercicios. Intente realizar 30 minutos de este tipo de ejercicios al menos tres días a la semana. °· No consuma ningún producto que contenga nicotina o tabaco, como cigarrillos, cigarrillos electrónicos y tabaco de mascar. Si necesita ayuda para dejar de fumar, consulte al   médico. °· Contrólese la presión arterial en su casa según las indicaciones del médico. °· Concurra a todas las visitas de seguimiento como se lo haya indicado el médico. Esto es importante. °Medicamentos °· Tome los medicamentos de venta libre y los recetados solamente como se lo haya indicado el médico. Siga cuidadosamente las indicaciones. Los medicamentos para la presión arterial deben tomarse según las indicaciones. °· No omita las dosis de medicamentos para la presión arterial. Si lo hace, estará en riesgo de tener problemas y puede hacer que los medicamentos  sean menos eficaces. °· Pregúntele a su médico a qué efectos secundarios o reacciones a los medicamentos debe prestar atención. °Comuníquese con un médico si: °· Piensa que tiene una reacción a un medicamento que está tomando. °· Tiene dolores de cabeza frecuentes (recurrentes). °· Se siente mareado. °· Tiene hinchazón en los tobillos. °· Tiene problemas de visión. °Solicite ayuda inmediatamente si: °· Siente un dolor de cabeza intenso o confusión. °· Siente debilidad inusual o adormecimiento. °· Siente que va a desmayarse. °· Siente un dolor intenso en el pecho o el abdomen. °· Vomita repetidas veces. °· Tiene dificultad para respirar. °Resumen °· La hipertensión se produce cuando la sangre bombea en las arterias con mucha fuerza. Si esta afección no se controla, podría correr riesgo de tener complicaciones graves. °· La presión arterial deseada puede variar en función de las enfermedades, la edad y otros factores personales. Para la mayoría de las personas, una presión arterial normal es menor que 120/80. °· La hipertensión se trata con cambios en el estilo de vida, medicamentos o una combinación de ambos. Los cambios en el estilo de vida incluyen pérdida de peso, ingerir alimentos sanos, seguir una dieta baja en sodio, hacer más ejercicio y limitar el consumo de alcohol. °Esta información no tiene como fin reemplazar el consejo del médico. Asegúrese de hacerle al médico cualquier pregunta que tenga. °Document Revised: 07/12/2018 Document Reviewed: 07/12/2018 °Elsevier Patient Education © 2020 [employer] ° °

## 2020-02-07 NOTE — Progress Notes (Signed)
Patient ID: Linda Delgado, female    DOB: 1971-02-14  MRN: [id number]  CC: Diabetes   Subjective: Linda Delgado is a 49 y.o. female who presents for chronic ds management. Her concerns today include:  Pt with hx of NAFLD, obesity, DM, HL, anxiety.   DIABETES TYPE 2 Last A1C:   Results for orders placed or performed in visit on 02/07/20  POCT glucose (manual entry)  Result Value Ref Range   POC Glucose 135 (A) 70 - 99 mg/dl  POCT glycosylated hemoglobin (Hb A1C)  Result Value Ref Range   Hemoglobin A1C     HbA1c POC (<> result, manual entry)     HbA1c, POC (prediabetic range) 6.3 5.7 - 6.4 %   HbA1c, POC (controlled diabetic range)      Med Adherence:  '[x]'  Yes on Metformin 500 mg daily  '[]'  No Medication side effects:  '[]'  Yes    '[x]'  No Home Monitoring?  '[x]'  Yes QOD    '[]'  No Home glucose results range: 130-137 Diet Adherence: '[]'  Yes    '[x]'  No including lots of  Exercise: '[]'  Yes    '[x]'  No but plans to start again.  Gained 7 lbs since last mth Hypoglycemic episodes?: '[]'  Yes    '[]'  No Numbness of the feet? '[]'  Yes    '[x]'  No Retinopathy hx? '[]'  Yes    '[x]'  No Last eye exam:  Up to date Comments:   Initial BP today 127/90.  BP last mth at GYN exam was 134/80. She tells me she does not use too much salt in foods.  She has a home BP monitor that she can use  HL:  Taking and tolerating Pravachol.  HM:  Due for colon cancer screening; had COVID-19 vaccine and has card with her today  Patient Active Problem List   Diagnosis Date Noted  . Hyperlipidemia associated with type 2 diabetes mellitus ([place]) 06/05/2019  . Controlled type 2 diabetes mellitus without complication, without long-term current use of insulin ([place]) 04/13/2018  . Mastalgia in female 02/23/2017  . NAFLD (nonalcoholic fatty liver disease) 02/23/2017  . Class 1 obesity due to excess calories without serious comorbidity with body mass index (BMI) of 30.0 to 30.9 in adult 02/23/2017  . Thyroid  activity decreased 11/13/2014  . Menorrhagia 04/02/2014     Current Outpatient Medications on File Prior to Visit  Medication Sig Dispense Refill  . Blood Glucose Monitoring Suppl (TRUE METRIX METER) w/Device KIT 1 each by Does not apply route 3 (three) times daily. (Patient not taking: Reported on 08/10/2018) 1 kit 0  . glucose blood (TRUE METRIX BLOOD GLUCOSE TEST) test strip Use as instructed 100 each 4  . hydrOXYzine (VISTARIL) 25 MG capsule Take 1 capsule (25 mg total) by mouth daily as needed for anxiety. 30 capsule 1  . metFORMIN (GLUCOPHAGE) 500 MG tablet Take 1 tablet (500 mg total) by mouth daily with breakfast. Then increase to 2 tabs twice daily in 1 week 90 tablet 3  . Multiple Vitamin (MULTIVITAMIN) tablet Take 1 tablet by mouth daily.    Marland Kitchen omega-3 acid ethyl esters (LOVAZA) 1 G capsule Take by mouth 2 (two) times daily.    . pravastatin (PRAVACHOL) 10 MG tablet Take one tab PO three days a wk at bedtime. 30 tablet 6  . TRUEplus Lancets 28G MISC USE AS DIRECTED 100 each 0   No current facility-administered medications on file prior to visit.    Allergies  Allergen Reactions  .  Atorvastatin Other (See Comments)    Muscle pain    Social History   Socioeconomic History  . Marital status: Married    Spouse name: Not on file  . Number of children: 3  . Years of education: Not on file  . Highest education level: Bachelor's degree (e.g., BA, AB, BS)  Occupational History  . Not on file  Tobacco Use  . Smoking status: Never Smoker  . Smokeless tobacco: Never Used  Substance and Sexual Activity  . Alcohol use: Yes    Comment: rarely  . Drug use: No  . Sexual activity: Yes    Partners: Male    Birth control/protection: None  Other Topics Concern  . Not on file  Social History Narrative   Patient doesn't like to talk on the phone   Social Determinants of Health   Financial Resource Strain:   . Difficulty of Paying Living Expenses:   Food Insecurity:   . Worried  About Charity fundraiser in the Last Year:   . Arboriculturist in the Last Year:   Transportation Needs: Unknown  . Lack of Transportation (Medical): Not on file  . Lack of Transportation (Non-Medical): No  Physical Activity:   . Days of Exercise per Week:   . Minutes of Exercise per Session:   Stress:   . Feeling of Stress :   Social Connections:   . Frequency of Communication with Friends and Family:   . Frequency of Social Gatherings with Friends and Family:   . Attends Religious Services:   . Active Member of Clubs or Organizations:   . Attends Archivist Meetings:   Marland Kitchen Marital Status:   Intimate Partner Violence:   . Fear of Current or Ex-Partner:   . Emotionally Abused:   Marland Kitchen Physically Abused:   . Sexually Abused:     Family History  Problem Relation Age of Onset  . Diabetes Father   . Hypertension Father   . Stroke Father   . Hypertension Mother   . Cancer Paternal Grandmother        stomach  . Breast cancer Paternal Aunt     Past Surgical History:  Procedure Laterality Date  . [place], 2007  . WISDOM TOOTH EXTRACTION      ROS: Review of Systems Negative except as stated above  PHYSICAL EXAM: BP 130/82   Pulse 66   Temp (!) 97.5 F (36.4 C)   Resp 16   Wt 185 lb 3.2 oz (84 kg)   SpO2 99%   BMI 31.79 kg/m   Wt Readings from Last 3 Encounters:  02/07/20 185 lb 3.2 oz (84 kg)  12/12/19 178 lb (80.7 kg)  08/19/19 176 lb 9.6 oz (80.1 kg)    Physical Exam  General appearance - alert, well appearing, and in no distress Mental status - normal mood, behavior, speech, dress, motor activity, and thought processes Neck - supple, no significant adenopathy Chest - clear to auscultation, no wheezes, rales or rhonchi, symmetric air entry Heart - normal rate, regular rhythm, normal S1, S2, no murmurs, rubs, clicks or gallops Extremities - peripheral pulses normal, no pedal edema, no clubbing or cyanosis   CMP Latest Ref Rng &  Units 06/03/2019 04/13/2018 03/14/2018  Glucose 65 - 99 mg/dL 120(H) - 242(H)  BUN 6 - 24 mg/dL 9 - 9  Creatinine 0.57 - 1.00 mg/dL 0.70 - 0.57  Sodium 134 - 144 mmol/L 138 - 135  Potassium  3.5 - 5.2 mmol/L 4.4 - 4.1  Chloride 96 - 106 mmol/L 99 - 98  CO2 20 - 29 mmol/L 26 - 21  Calcium 8.7 - 10.2 mg/dL 9.2 - 8.9  Total Protein 6.0 - 8.5 g/dL 7.1 7.1 7.0  Total Bilirubin 0.0 - 1.2 mg/dL 0.5 0.5 0.5  Alkaline Phos 39 - 117 IU/L 79 82 130(H)  AST 0 - 40 IU/L 19 45(H) 113(H)  ALT 0 - 32 IU/L 16 64(H) 125(H)   Lipid Panel     Component Value Date/Time   CHOL 220 (H) 06/03/2019 1147   TRIG 214 (H) 06/03/2019 1147   HDL 59 06/03/2019 1147   CHOLHDL 3.7 06/03/2019 1147   CHOLHDL 3.8 02/24/2014 0900   VLDL 33 02/24/2014 0900   LDLCALC 118 (H) 06/03/2019 1147    CBC    Component Value Date/Time   WBC 9.5 06/03/2019 1147   WBC 6.0 11/13/2014 1137   RBC 4.62 06/03/2019 1147   RBC 4.23 11/13/2014 1137   HGB 13.3 06/03/2019 1147   HCT 40.0 06/03/2019 1147   PLT 224 06/03/2019 1147   MCV 87 06/03/2019 1147   MCH 28.8 06/03/2019 1147   MCH 28.6 11/13/2014 1137   MCHC 33.3 06/03/2019 1147   MCHC 34.0 11/13/2014 1137   RDW 12.8 06/03/2019 1147   LYMPHSABS 2.5 11/13/2014 1137   MONOABS 0.4 11/13/2014 1137   EOSABS 0.2 11/13/2014 1137   BASOSABS 0.0 11/13/2014 1137    ASSESSMENT AND PLAN: 1. Type 2 diabetes mellitus with obesity (HCC) A1c is at goal.  However I have still encouraged her to to do better with trying to eat healthy and exercise regularly to improve her overall health.  She will start walking 3 to 4 days a week for 30 to 45 minutes. - POCT glucose (manual entry) - POCT glycosylated hemoglobin (Hb A1C)  2. Mixed hyperlipidemia Continue Pravachol  3. Screening for colon cancer Discussed colon cancer screening.  She is uninsured.  She is agreeable to doing the fit test - Fecal occult blood, imunochemical([facility])  4. Elevated blood pressure reading DASH diet  discussed and encouraged.  We will plan to recheck blood pressure again on her next visit    Patient was given the opportunity to ask questions.  Patient verbalized understanding of the plan and was able to repeat key elements of the plan.  [employer] interpreter used during this encounter. #[id number]   Orders Placed This Encounter  Procedures  . Fecal occult blood, imunochemical([facility])  . POCT glucose (manual entry)  . POCT glycosylated hemoglobin (Hb A1C)     Requested Prescriptions    No prescriptions requested or ordered in this encounter    Return in about 4 months (around 06/08/2020).  Karle Plumber, MD, FACP

## 2020-02-26 LAB — FECAL OCCULT BLOOD, IMMUNOCHEMICAL: Fecal Occult Bld: NEGATIVE

## 2020-03-02 ENCOUNTER — Telehealth: Payer: Self-pay

## 2020-03-02 NOTE — Telephone Encounter (Signed)
Please call pt back at [phone] with test results.

## 2020-03-02 NOTE — Telephone Encounter (Signed)
-----   Message from Marcine Matar, MD sent at 02/26/2020 11:35 AM EDT ----- Let patient know that her fecal occult blood test was negative which is good.  She will be due again in 1 year.

## 2020-03-02 NOTE — Telephone Encounter (Signed)
[employer] Sue Lush  Id#  [id number] contacted pt to go over fit test results pt didn't answer lvm asking pt to give a call back at her earliest convenience

## 2020-03-03 NOTE — Telephone Encounter (Signed)
  As instructed by NP/MD called pt/ name and DOB verified/ made aware of results and MD/NP results note. Verbalized understanding  'Let patient know that her fecal occult blood test was negative which is good.  She will be due again in 1 year."

## 2020-03-03 NOTE — Telephone Encounter (Signed)
Could you please contact pt back with results

## 2020-03-23 MED FILL — METFORMIN HCL 500 MG TABS: 500 | 22 days supply | Qty: 90 | Fill #3

## 2020-03-23 MED FILL — PRAVASTATIN SODIUM 10 MG TA: 10 | 30 days supply | Qty: 30 | Fill #2

## 2020-03-31 ENCOUNTER — Other Ambulatory Visit: Payer: Self-pay

## 2020-03-31 ENCOUNTER — Ambulatory Visit: Payer: Self-pay

## 2020-06-08 ENCOUNTER — Other Ambulatory Visit: Payer: Self-pay | Admitting: Internal Medicine

## 2020-06-08 ENCOUNTER — Ambulatory Visit: Payer: Self-pay | Attending: Internal Medicine | Admitting: Internal Medicine

## 2020-06-08 DIAGNOSIS — Z23 Encounter for immunization: Secondary | ICD-10-CM

## 2020-06-08 DIAGNOSIS — Z1159 Encounter for screening for other viral diseases: Secondary | ICD-10-CM

## 2020-06-08 DIAGNOSIS — F411 Generalized anxiety disorder: Secondary | ICD-10-CM

## 2020-06-08 DIAGNOSIS — E119 Type 2 diabetes mellitus without complications: Secondary | ICD-10-CM

## 2020-06-08 DIAGNOSIS — E782 Mixed hyperlipidemia: Secondary | ICD-10-CM

## 2020-06-08 MED ORDER — PRAVASTATIN SODIUM 10 MG PO TABS: ORAL_TABLET | ORAL | 6 refills | Status: DC

## 2020-06-08 MED ORDER — TRUEPLUS LANCETS 28G MISC: 11 refills | Status: DC

## 2020-06-08 MED ORDER — HYDROXYZINE PAMOATE 25 MG PO CAPS: 25.0000 mg | ORAL_CAPSULE | Freq: Every day | ORAL | 1 refills | Status: DC | PRN

## 2020-06-08 MED ORDER — METFORMIN HCL 500 MG PO TABS: 500.0000 mg | ORAL_TABLET | Freq: Two times a day (BID) | ORAL | 3 refills | Status: DC

## 2020-06-08 MED ORDER — TRUE METRIX BLOOD GLUCOSE TEST VI STRP: ORAL_STRIP | 11 refills | Status: DC

## 2020-06-08 MED FILL — PRAVASTATIN SODIUM 10 MG TA: 10 | 30 days supply | Qty: 12 | Fill #0

## 2020-06-08 MED FILL — METFORMIN HCL 500 MG TABS: 500 | 30 days supply | Qty: 60 | Fill #0

## 2020-06-08 MED FILL — TRUEplus LANCETS 28G MISC: 25 days supply | Qty: 100 | Fill #0

## 2020-06-08 MED FILL — TRUE METRIX TEST STRIP: 25 days supply | Qty: 100 | Fill #0

## 2020-06-08 NOTE — Progress Notes (Signed)
Virtual Visit via Telephone Note Due to current restrictions/limitations of in-office visits due to the COVID-19 pandemic, this scheduled clinical appointment was converted to a telehealth visit  I connected with Linda Delgado on 06/08/20 at 11: 21 a.m by telephone and verified that I am speaking with the correct person using two identifiers. I am in my office.  The patient is at home.  Only the patient, myself and [place] from [employer] [phone]) participated in this encounter.  I discussed the limitations, risks, security and privacy concerns of performing an evaluation and management service by telephone and the availability of in person appointments. I also discussed with the patient that there may be a patient responsible charge related to this service. The patient expressed understanding and agreed to proceed.   History of Present Illness: Pt with hx of NAFLD, obesity, DM, HL, anxiety. Patient last evaluated 01/2020.  Purpose of today's visit is chronic disease management.  DIABETES TYPE 2 Last A1C:   Results for orders placed or performed in visit on 02/07/20  Fecal occult blood, imunochemical([facility])   Specimen: Stool   ST  Result Value Ref Range   Fecal Occult Bld Negative Negative  POCT glucose (manual entry)  Result Value Ref Range   POC Glucose 135 (A) 70 - 99 mg/dl  POCT glycosylated hemoglobin (Hb A1C)  Result Value Ref Range   Hemoglobin A1C     HbA1c POC (<> result, manual entry)     HbA1c, POC (prediabetic range) 6.3 5.7 - 6.4 %   HbA1c, POC (controlled diabetic range)      Med Adherence:  '[x]'  Yes -Metformin 500 mg daily    '[]'  No Medication side effects:  '[]'  Yes    '[x]'  No Home Monitoring?  '[x]'  Yes  QOD before BF Home glucose results range: today BS was 140.  Range 140-170 Diet Adherence:  Eating more and not exercising in past mth.  This is why she feels the BS have increased Exercise: not as much in the past 1 mth.  Her treadmill  gave out. Hypoglycemic episodes?: '[]'  Yes    '[x]'  No Numbness of the feet? '[]'  Yes    '[x]'  No Retinopathy hx? '[]'  Yes    '[x]'  No Last eye exam:  Up to date on eye exam Comments:  HL:  Needs RF on Pravachol.  Reports compliance.  Anxiety:  Request RF on Hydroxyzine.  Outpatient Encounter Medications as of 06/08/2020  Medication Sig   Blood Glucose Monitoring Suppl (TRUE METRIX METER) w/Device KIT 1 each by Does not apply route 3 (three) times daily. (Patient not taking: Reported on 08/10/2018)   glucose blood (TRUE METRIX BLOOD GLUCOSE TEST) test strip Use as instructed   hydrOXYzine (VISTARIL) 25 MG capsule Take 1 capsule (25 mg total) by mouth daily as needed for anxiety.   metFORMIN (GLUCOPHAGE) 500 MG tablet Take 1 tablet (500 mg total) by mouth daily with breakfast. Then increase to 2 tabs twice daily in 1 week   Multiple Vitamin (MULTIVITAMIN) tablet Take 1 tablet by mouth daily.   omega-3 acid ethyl esters (LOVAZA) 1 G capsule Take by mouth 2 (two) times daily.   pravastatin (PRAVACHOL) 10 MG tablet Take one tab PO three days a wk at bedtime.   TRUEplus Lancets 28G MISC USE AS DIRECTED   No facility-administered encounter medications on file as of 06/08/2020.      Observations/Objective: No direct observation done as this was a telephone encounter.  Assessment and Plan: 1. Controlled type  2 diabetes mellitus without complication, without long-term current use of insulin (HCC) Blood sugars not quite where they need to be for fasting.  Fasting blood sugar goal is 90-130.  Dietary counseling given.  Encouraged her to cut back on portion sizes.  Increase Metformin to 500 mg twice a day.  Encouraged her to get back into an exercise routine.  She can walk outside or do walking in place at home for 30 minutes 3 to 4 days a week. - metFORMIN (GLUCOPHAGE) 500 MG tablet; Take 1 tablet (500 mg total) by mouth 2 (two) times daily with a meal.  Dispense: 180 tablet; Refill: 3 - glucose blood  (TRUE METRIX BLOOD GLUCOSE TEST) test strip; Use as instructed  Dispense: 100 each; Refill: 11 - Microalbumin / creatinine urine ratio; Future - Hemoglobin A1c; Future - CBC; Future - Comprehensive metabolic panel; Future - Lipid panel; Future  2. Mixed hyperlipidemia - pravastatin (PRAVACHOL) 10 MG tablet; Take one tab PO three days a wk at bedtime.  Dispense: 30 tablet; Refill: 6  3. Generalized anxiety disorder - hydrOXYzine (VISTARIL) 25 MG capsule; Take 1 capsule (25 mg total) by mouth daily as needed for anxiety.  Dispense: 30 capsule; Refill: 1  4. Need for hepatitis C screening test - Hepatitis C Antibody; Future  5. Need for influenza vaccination She will come as nurse only visit to have this administered.   Follow Up Instructions: 4 mths   I discussed the assessment and treatment plan with the patient. The patient was provided an opportunity to ask questions and all were answered. The patient agreed with the plan and demonstrated an understanding of the instructions.   The patient was advised to call back or seek an in-person evaluation if the symptoms worsen or if the condition fails to improve as anticipated.  I provided 13 minutes of non-face-to-face time during this encounter.   Karle Plumber, MD

## 2020-06-09 ENCOUNTER — Other Ambulatory Visit: Payer: Self-pay

## 2020-06-09 ENCOUNTER — Ambulatory Visit: Payer: Self-pay | Attending: Family Medicine | Admitting: Pharmacist

## 2020-06-09 DIAGNOSIS — Z23 Encounter for immunization: Secondary | ICD-10-CM

## 2020-06-09 DIAGNOSIS — Z1159 Encounter for screening for other viral diseases: Secondary | ICD-10-CM

## 2020-06-09 DIAGNOSIS — E119 Type 2 diabetes mellitus without complications: Secondary | ICD-10-CM

## 2020-06-09 NOTE — Progress Notes (Signed)
Patient presents for vaccination against influenza per orders of Dr. Johnson. Consent given. Counseling provided. No contraindications exists. Vaccine administered without incident.  ° °Luke Van Ausdall, PharmD, CPP °Clinical Pharmacist °[facility] °[phone] ° °

## 2020-06-10 ENCOUNTER — Other Ambulatory Visit: Payer: Self-pay | Admitting: Internal Medicine

## 2020-06-10 DIAGNOSIS — E782 Mixed hyperlipidemia: Secondary | ICD-10-CM

## 2020-06-10 LAB — COMPREHENSIVE METABOLIC PANEL
ALT: 44 IU/L — ABNORMAL HIGH (ref 0–32)
AST: 30 IU/L (ref 0–40)
Albumin/Globulin Ratio: 1.7 (ref 1.2–2.2)
Albumin: 4.5 g/dL (ref 3.8–4.8)
Alkaline Phosphatase: 82 IU/L (ref 48–121)
BUN/Creatinine Ratio: 13 (ref 9–23)
BUN: 10 mg/dL (ref 6–24)
Bilirubin Total: 0.5 mg/dL (ref 0.0–1.2)
CO2: 23 mmol/L (ref 20–29)
Calcium: 9.1 mg/dL (ref 8.7–10.2)
Chloride: 101 mmol/L (ref 96–106)
Creatinine, Ser: 0.75 mg/dL (ref 0.57–1.00)
GFR calc Af Amer: 107 mL/min/{1.73_m2} (ref >59)
GFR calc non Af Amer: 93 mL/min/{1.73_m2} (ref >59)
Globulin, Total: 2.7 g/dL (ref 1.5–4.5)
Glucose: 144 mg/dL — ABNORMAL HIGH (ref 65–99)
Potassium: 4.2 mmol/L (ref 3.5–5.2)
Sodium: 138 mmol/L (ref 134–144)
Total Protein: 7.2 g/dL (ref 6.0–8.5)

## 2020-06-10 LAB — CBC
Hematocrit: 38.3 % (ref 34.0–46.6)
Hemoglobin: 12.7 g/dL (ref 11.1–15.9)
MCH: 29.7 pg (ref 26.6–33.0)
MCHC: 33.2 g/dL (ref 31.5–35.7)
MCV: 90 fL (ref 79–97)
Platelets: 218 10*3/uL (ref 150–450)
RBC: 4.28 x10E6/uL (ref 3.77–5.28)
RDW: 13.2 % (ref 11.7–15.4)
WBC: 7 10*3/uL (ref 3.4–10.8)

## 2020-06-10 LAB — LIPID PANEL
Chol/HDL Ratio: 4.6 ratio — ABNORMAL HIGH (ref 0.0–4.4)
Cholesterol, Total: 223 mg/dL — ABNORMAL HIGH (ref 100–199)
HDL: 48 mg/dL (ref >39)
LDL Chol Calc (NIH): 123 mg/dL — ABNORMAL HIGH (ref 0–99)
Triglycerides: 299 mg/dL — ABNORMAL HIGH (ref 0–149)
VLDL Cholesterol Cal: 52 mg/dL — ABNORMAL HIGH (ref 5–40)

## 2020-06-10 LAB — MICROALBUMIN / CREATININE URINE RATIO
Creatinine, Urine: 132.9 mg/dL
Microalb/Creat Ratio: 9 mg/g creat (ref 0–29)
Microalbumin, Urine: 12.1 ug/mL

## 2020-06-10 LAB — HEPATITIS C ANTIBODY: Hep C Virus Ab: <0.1 s/co ratio (ref 0.0–0.9)

## 2020-06-10 LAB — HEMOGLOBIN A1C
Est. average glucose Bld gHb Est-mCnc: 148 mg/dL
Hgb A1c MFr Bld: 6.8 % — ABNORMAL HIGH (ref 4.8–5.6)

## 2020-06-10 MED ORDER — PRAVASTATIN SODIUM 20 MG PO TABS: 20.0000 mg | ORAL_TABLET | Freq: Every day | ORAL | 4 refills | Status: DC

## 2020-06-10 MED FILL — ?PRAVASTATIN NA 20 MG TAB: 20 | 30 days supply | Qty: 30 | Fill #0

## 2020-06-10 NOTE — Progress Notes (Signed)
Let patient know that her total and LDL cholesterol are still very elevated.  This puts her at risk for heart attack and strokes.  I recommend increasing the pravastatin from 10 mg daily to 20 mg daily.  This means she will take 2 of the 10 mg tablets until she runs out.  I have sent an updated prescription to the pharmacy for the 20 mg tablets.  Kidney function is good.  She has slight elevation in one of her liver function tests.  We will observe for now.  Screen for hepatitis C is negative.  Her A1c is 6.8 with goal being less than 7 for diabetes.

## 2020-06-11 MED FILL — HYDROXYZINE PAM 25 MG CAP: 25 | 30 days supply | Qty: 30 | Fill #0

## 2020-06-16 ENCOUNTER — Telehealth: Payer: Self-pay

## 2020-06-16 NOTE — Telephone Encounter (Signed)
[employer]  Carlos Id#  [id number] contacted pt to go over lab results pt didn't answer lvm asking pt to give a call back  °

## 2020-06-19 ENCOUNTER — Ambulatory Visit: Payer: No Typology Code available for payment source | Admitting: Pharmacist

## 2020-06-22 MED FILL — HYDROXYZINE PAM 25 MG CAP: 25 | 30 days supply | Qty: 30 | Fill #0

## 2020-08-17 MED FILL — METFORMIN HCL 500 MG TABS: 500 | 30 days supply | Qty: 60 | Fill #1

## 2020-08-17 MED FILL — ?PRAVASTATIN NA 20MG TABL: 20 | 30 days supply | Qty: 30 | Fill #0

## 2020-10-07 MED FILL — METFORMIN HCL 500 MG TABS: 500 | 30 days supply | Qty: 60 | Fill #2

## 2020-10-07 MED FILL — TRUEplus LANCETS 28G MISC: 25 days supply | Qty: 100 | Fill #1

## 2020-10-07 MED FILL — TRUE METRIX GLUCOSE TEST ST: 25 days supply | Qty: 100 | Fill #1

## 2020-10-13 ENCOUNTER — Other Ambulatory Visit: Payer: Self-pay

## 2020-10-13 ENCOUNTER — Ambulatory Visit: Payer: Self-pay | Attending: Internal Medicine | Admitting: Internal Medicine

## 2020-10-13 ENCOUNTER — Other Ambulatory Visit: Payer: Self-pay | Admitting: Internal Medicine

## 2020-10-13 DIAGNOSIS — K760 Fatty (change of) liver, not elsewhere classified: Secondary | ICD-10-CM

## 2020-10-13 DIAGNOSIS — E1169 Type 2 diabetes mellitus with other specified complication: Secondary | ICD-10-CM

## 2020-10-13 DIAGNOSIS — E669 Obesity, unspecified: Secondary | ICD-10-CM

## 2020-10-13 DIAGNOSIS — J029 Acute pharyngitis, unspecified: Secondary | ICD-10-CM

## 2020-10-13 DIAGNOSIS — E782 Mixed hyperlipidemia: Secondary | ICD-10-CM

## 2020-10-13 MED ORDER — METFORMIN HCL 500 MG PO TABS: 500.0000 mg | ORAL_TABLET | Freq: Two times a day (BID) | ORAL | 3 refills | Status: DC

## 2020-10-13 MED ORDER — AMOXICILLIN 500 MG PO CAPS: 500.0000 mg | ORAL_CAPSULE | Freq: Three times a day (TID) | ORAL | 0 refills | Status: DC

## 2020-10-13 MED FILL — AMOXICILLIN 500 MG CAPSULE: 500 | 7 days supply | Qty: 21 | Fill #0

## 2020-10-13 NOTE — Progress Notes (Signed)
Virtual Visit via Telephone Note  I connected with Linda Delgado on 10/13/20 at 1:35 p.m by telephone and verified that I am speaking with the correct person using two identifiers.  Location: Patient: home Provider: office  My [place], pt, myself and Len Childs of [employer] participated [phone]). I discussed the limitations, risks, security and privacy concerns of performing an evaluation and management service by telephone and the availability of in person appointments. I also discussed with the patient that there may be a patient responsible charge related to this service. The patient expressed understanding and agreed to proceed.   History of Present Illness: Pt with hx of NAFLD, obesity, DM, HL, anxiety. Today is for chronic ds management and UC  Pt c/o discomfort with her throat x 3 days. Yesterday it was itchy but today it is painful.  Throat is red.  Felt hot 2 days ago with chills. Very little cough, no nasal or chest congestion.  Had a friend over 5 days ago who had the flu.  She has had flu shot and 2 COVID shots. Plans to get COVID booster when she feels better. Using hot tea which helps to sooth throat.   DM:  BS high lately.  Checking BS every other day.  BS this a.m was 200.  Range 160-170.  -reports she is taking Metformin 500 mg once a day.  On last visit in 05/2020 Metformin inc to 500 mg BID but pt states she went back to once day dosing after a while because BS had started dropping low. -Doing okay with eating habits.  Limits bread and tortia intake. -not exercising x 2 mths.due to lack of energy. Does not sleep well due to itching at times  HL/NAFLD:  Lipid profile was not at goal on last visit.  I recommended increasing Pravachol to 20 mg daily.   Pt reports compliance with med.  Current Outpatient Medications on File Prior to Visit  Medication Sig Dispense Refill  . Blood Glucose Monitoring Suppl (TRUE METRIX METER) w/Device KIT 1 each by Does not  apply route 3 (three) times daily. (Patient not taking: Reported on 08/10/2018) 1 kit 0  . glucose blood (TRUE METRIX BLOOD GLUCOSE TEST) test strip Use as instructed 100 each 11  . hydrOXYzine (VISTARIL) 25 MG capsule Take 1 capsule (25 mg total) by mouth daily as needed for anxiety. 30 capsule 1  . metFORMIN (GLUCOPHAGE) 500 MG tablet Take 1 tablet (500 mg total) by mouth 2 (two) times daily with a meal. 180 tablet 3  . Multiple Vitamin (MULTIVITAMIN) tablet Take 1 tablet by mouth daily.    Marland Kitchen omega-3 acid ethyl esters (LOVAZA) 1 G capsule Take by mouth 2 (two) times daily.    . pravastatin (PRAVACHOL) 20 MG tablet Take 1 tablet (20 mg total) by mouth daily. 30 tablet 4  . TRUEplus Lancets 28G MISC USE AS DIRECTED 100 each 11   No current facility-administered medications on file prior to visit.      Observations/Objective: Results for orders placed or performed in visit on 06/09/20  Lipid panel  Result Value Ref Range   Cholesterol, Total 223 (H) 100 - 199 mg/dL   Triglycerides 299 (H) 0 - 149 mg/dL   HDL 48 >39 mg/dL   VLDL Cholesterol Cal 52 (H) 5 - 40 mg/dL   LDL Chol Calc (NIH) 123 (H) 0 - 99 mg/dL   Chol/HDL Ratio 4.6 (H) 0.0 - 4.4 ratio  Comprehensive metabolic panel  Result Value Ref Range  Glucose 144 (H) 65 - 99 mg/dL   BUN 10 6 - 24 mg/dL   Creatinine, Ser 0.75 0.57 - 1.00 mg/dL   GFR calc non Af Amer 93 >59 mL/min/1.73   GFR calc Af Amer 107 >59 mL/min/1.73   BUN/Creatinine Ratio 13 9 - 23   Sodium 138 134 - 144 mmol/L   Potassium 4.2 3.5 - 5.2 mmol/L   Chloride 101 96 - 106 mmol/L   CO2 23 20 - 29 mmol/L   Calcium 9.1 8.7 - 10.2 mg/dL   Total Protein 7.2 6.0 - 8.5 g/dL   Albumin 4.5 3.8 - 4.8 g/dL   Globulin, Total 2.7 1.5 - 4.5 g/dL   Albumin/Globulin Ratio 1.7 1.2 - 2.2   Bilirubin Total 0.5 0.0 - 1.2 mg/dL   Alkaline Phosphatase 82 48 - 121 IU/L   AST 30 0 - 40 IU/L   ALT 44 (H) 0 - 32 IU/L  CBC  Result Value Ref Range   WBC 7.0 3.4 - 10.8 x10E3/uL    RBC 4.28 3.77 - 5.28 x10E6/uL   Hemoglobin 12.7 11.1 - 15.9 g/dL   Hematocrit 38.3 34.0 - 46.6 %   MCV 90 79 - 97 fL   MCH 29.7 26.6 - 33.0 pg   MCHC 33.2 31.5 - 35.7 g/dL   RDW 13.2 11.7 - 15.4 %   Platelets 218 150 - 450 x10E3/uL  Hepatitis C Antibody  Result Value Ref Range   Hep C Virus Ab <0.1 0.0 - 0.9 s/co ratio  Hemoglobin A1c  Result Value Ref Range   Hgb A1c MFr Bld 6.8 (H) 4.8 - 5.6 %   Est. average glucose Bld gHb Est-mCnc 148 mg/dL  Microalbumin / creatinine urine ratio  Result Value Ref Range   Creatinine, Urine 132.9 Not Estab. mg/dL   Microalbumin, Urine 12.1 Not Estab. ug/mL   Microalb/Creat Ratio 9 0 - 29 mg/g creat     Assessment and Plan: 1. Pharyngitis, unspecified etiology Advised to gargle with warm water mixed with salt.  Since unable to examine, and based on her history of redness of the throat, pain and fever, will treat as bacterial pharyngitis - amoxicillin (AMOXIL) 500 MG capsule; Take 1 capsule (500 mg total) by mouth 3 (three) times daily.  Dispense: 21 capsule; Refill: 0  2. Diabetes mellitus type 2 in obese ([place]) Blood sugars not at goal.  Recommend that she takes Metformin twice a day and let me know if she experiences any low blood sugar episodes.  Healthy eating habits and regular exercise advised - Hemoglobin A1c; Future - CBC; Future - Lipid panel; Future - metFORMIN (GLUCOPHAGE) 500 MG tablet; Take 1 tablet (500 mg total) by mouth 2 (two) times daily with a meal.  Dispense: 180 tablet; Refill: 3  3. Mixed hyperlipidemia Continue Pravachol - Lipid panel; Future  4. NAFLD (nonalcoholic fatty liver disease) - Hepatic Function Panel; Future   Follow Up Instructions: 4 mths   I discussed the assessment and treatment plan with the patient. The patient was provided an opportunity to ask questions and all were answered. The patient agreed with the plan and demonstrated an understanding of the instructions.   The patient was advised to  call back or seek an in-person evaluation if the symptoms worsen or if the condition fails to improve as anticipated.  I provided 20 minutes of non-face-to-face time during this encounter.   Karle Plumber, MD

## 2020-10-19 ENCOUNTER — Other Ambulatory Visit: Payer: Self-pay

## 2020-10-19 ENCOUNTER — Ambulatory Visit: Payer: No Typology Code available for payment source | Attending: Internal Medicine

## 2020-10-19 DIAGNOSIS — K760 Fatty (change of) liver, not elsewhere classified: Secondary | ICD-10-CM

## 2020-10-19 DIAGNOSIS — E782 Mixed hyperlipidemia: Secondary | ICD-10-CM

## 2020-10-19 DIAGNOSIS — E1169 Type 2 diabetes mellitus with other specified complication: Secondary | ICD-10-CM

## 2020-10-20 LAB — CBC
Hematocrit: 39.3 % (ref 34.0–46.6)
Hemoglobin: 13.1 g/dL (ref 11.1–15.9)
MCH: 28.4 pg (ref 26.6–33.0)
MCHC: 33.3 g/dL (ref 31.5–35.7)
MCV: 85 fL (ref 79–97)
Platelets: 282 10*3/uL (ref 150–450)
RBC: 4.61 x10E6/uL (ref 3.77–5.28)
RDW: 12.3 % (ref 11.7–15.4)
WBC: 7 10*3/uL (ref 3.4–10.8)

## 2020-10-20 LAB — LIPID PANEL
Chol/HDL Ratio: 5.7 ratio — ABNORMAL HIGH (ref 0.0–4.4)
Cholesterol, Total: 221 mg/dL — ABNORMAL HIGH (ref 100–199)
HDL: 39 mg/dL — ABNORMAL LOW (ref >39)
LDL Chol Calc (NIH): 126 mg/dL — ABNORMAL HIGH (ref 0–99)
Triglycerides: 313 mg/dL — ABNORMAL HIGH (ref 0–149)
VLDL Cholesterol Cal: 56 mg/dL — ABNORMAL HIGH (ref 5–40)

## 2020-10-20 LAB — HEPATIC FUNCTION PANEL
ALT: 86 IU/L — ABNORMAL HIGH (ref 0–32)
AST: 63 IU/L — ABNORMAL HIGH (ref 0–40)
Albumin: 4.6 g/dL (ref 3.8–4.8)
Alkaline Phosphatase: 99 IU/L (ref 44–121)
Bilirubin Total: 0.5 mg/dL (ref 0.0–1.2)
Bilirubin, Direct: 0.12 mg/dL (ref 0.00–0.40)
Total Protein: 7.7 g/dL (ref 6.0–8.5)

## 2020-10-20 LAB — HEMOGLOBIN A1C
Est. average glucose Bld gHb Est-mCnc: 200 mg/dL
Hgb A1c MFr Bld: 8.6 % — ABNORMAL HIGH (ref 4.8–5.6)

## 2020-10-21 ENCOUNTER — Telehealth: Payer: Self-pay

## 2020-10-21 ENCOUNTER — Other Ambulatory Visit: Payer: Self-pay | Admitting: Internal Medicine

## 2020-10-21 DIAGNOSIS — R7989 Other specified abnormal findings of blood chemistry: Secondary | ICD-10-CM

## 2020-10-21 DIAGNOSIS — R945 Abnormal results of liver function studies: Secondary | ICD-10-CM

## 2020-10-21 NOTE — Progress Notes (Signed)
Let patient know that her A1c is 8.6 which means her diabetes is not well controlled.  She should take the metformin twice a day as prescribed.  She has mild elevation in 2 of her liver enzymes.  This may be due to a combination of her having fatty liver and the cholesterol medication.  Her cholesterol levels remain elevated and unchanged compared to 4 months ago suggesting that she is not taking the cholesterol medication consistently.  High cholesterol increases the risk for heart attack and strokes.  I recommend that she takes the Pravachol consistently.  Please return to the lab in 3 weeks for repeat liver function tests.

## 2020-10-21 NOTE — Telephone Encounter (Signed)
[employer] Linda Delgado  Id# [id number]  contacted pt to go over lab results lvm 

## 2020-10-28 ENCOUNTER — Other Ambulatory Visit: Payer: Self-pay

## 2020-10-28 ENCOUNTER — Ambulatory Visit: Payer: Self-pay | Attending: Internal Medicine

## 2020-10-28 MED FILL — ?PRAVASTATIN NA 20MG TABL: 20 | 30 days supply | Qty: 30 | Fill #1

## 2020-11-02 MED FILL — METFORMIN HCL 500 MG TABS: 500 | 30 days supply | Qty: 60 | Fill #3

## 2020-11-23 ENCOUNTER — Ambulatory Visit: Payer: No Typology Code available for payment source | Attending: Internal Medicine

## 2020-11-23 ENCOUNTER — Other Ambulatory Visit: Payer: Self-pay

## 2020-11-23 DIAGNOSIS — R945 Abnormal results of liver function studies: Secondary | ICD-10-CM

## 2020-11-23 DIAGNOSIS — R7989 Other specified abnormal findings of blood chemistry: Secondary | ICD-10-CM

## 2020-11-24 LAB — HEPATIC FUNCTION PANEL
ALT: 68 IU/L — ABNORMAL HIGH (ref 0–32)
AST: 36 IU/L (ref 0–40)
Albumin: 4.9 g/dL — ABNORMAL HIGH (ref 3.8–4.8)
Alkaline Phosphatase: 97 IU/L (ref 44–121)
Bilirubin Total: 0.4 mg/dL (ref 0.0–1.2)
Bilirubin, Direct: <0.1 mg/dL (ref 0.00–0.40)
Total Protein: 7.7 g/dL (ref 6.0–8.5)

## 2020-11-24 LAB — HEPATITIS B SURFACE ANTIBODY, QUANTITATIVE: Hepatitis B Surf Ab Quant: 26 m[IU]/mL (ref >9.9)

## 2020-11-24 LAB — HEPATITIS B SURFACE ANTIGEN: Hepatitis B Surface Ag: NEGATIVE

## 2020-11-25 ENCOUNTER — Telehealth: Payer: Self-pay

## 2020-11-25 NOTE — Telephone Encounter (Signed)
[employer] Glenda Id#[id number] contacted pt to go over lab results pt is aware and doesn't have any questions or concerns  

## 2020-11-25 NOTE — Progress Notes (Signed)
Let patient know that her liver function tests have improved.  Continue pravastatin.

## 2021-01-09 ENCOUNTER — Other Ambulatory Visit: Payer: Self-pay

## 2021-01-28 ENCOUNTER — Other Ambulatory Visit: Payer: Self-pay

## 2021-01-28 MED FILL — Pravastatin Sodium Tab 20 MG: ORAL | 30 days supply | Qty: 30 | Fill #0 | Status: AC

## 2021-01-28 MED FILL — Metformin HCl Tab 500 MG: ORAL | 30 days supply | Qty: 60 | Fill #0 | Status: AC

## 2021-02-08 ENCOUNTER — Ambulatory Visit: Payer: No Typology Code available for payment source | Admitting: Internal Medicine

## 2021-03-03 ENCOUNTER — Other Ambulatory Visit: Payer: Self-pay

## 2021-03-03 MED FILL — Metformin HCl Tab 500 MG: ORAL | 30 days supply | Qty: 60 | Fill #1 | Status: AC

## 2021-03-04 ENCOUNTER — Other Ambulatory Visit: Payer: Self-pay

## 2021-03-23 ENCOUNTER — Other Ambulatory Visit: Payer: Self-pay

## 2021-03-23 ENCOUNTER — Ambulatory Visit: Payer: Self-pay | Attending: Internal Medicine | Admitting: Internal Medicine

## 2021-03-23 ENCOUNTER — Encounter: Payer: Self-pay | Admitting: Internal Medicine

## 2021-03-23 VITALS — BP 124/89 | HR 70 | Resp 16 | Wt 182.8 lb

## 2021-03-23 DIAGNOSIS — E669 Obesity, unspecified: Secondary | ICD-10-CM

## 2021-03-23 DIAGNOSIS — R1012 Left upper quadrant pain: Secondary | ICD-10-CM

## 2021-03-23 DIAGNOSIS — E782 Mixed hyperlipidemia: Secondary | ICD-10-CM

## 2021-03-23 DIAGNOSIS — E1169 Type 2 diabetes mellitus with other specified complication: Secondary | ICD-10-CM

## 2021-03-23 DIAGNOSIS — Z1211 Encounter for screening for malignant neoplasm of colon: Secondary | ICD-10-CM

## 2021-03-23 DIAGNOSIS — R030 Elevated blood-pressure reading, without diagnosis of hypertension: Secondary | ICD-10-CM

## 2021-03-23 LAB — POCT GLYCOSYLATED HEMOGLOBIN (HGB A1C): HbA1c, POC (controlled diabetic range): 9.5 % — AB (ref 0.0–7.0)

## 2021-03-23 LAB — GLUCOSE, POCT (MANUAL RESULT ENTRY): POC Glucose: 268 mg/dl — AB (ref 70–99)

## 2021-03-23 MED ORDER — PRAVASTATIN SODIUM 40 MG PO TABS
ORAL_TABLET | ORAL | 1 refills | Status: DC
  Filled 2021-03-23: qty 15, 30d supply, fill #0

## 2021-03-23 MED ORDER — TRULICITY 0.75 MG/0.5ML ~~LOC~~ SOAJ
0.7500 mg | SUBCUTANEOUS | 5 refills | Status: DC
  Filled 2021-03-23: qty 2, 28d supply, fill #0
  Filled 2021-04-20: qty 2, 28d supply, fill #1
  Filled 2021-05-11: qty 2, 28d supply, fill #2
  Filled 2021-06-15: qty 2, 28d supply, fill #3
  Filled 2021-07-14: qty 2, 28d supply, fill #4
  Filled 2021-08-11: qty 2, 28d supply, fill #5

## 2021-03-23 NOTE — Progress Notes (Signed)
Patient ID: Linda Delgado, female    DOB: 02-26-1971  MRN: [id number]  CC: Diabetes   Subjective: Linda Delgado is a 50 y.o. female who presents for chronic ds management Her concerns today include: Pt with hx of NAFLD, obesity, DM, HL, anxiety.  DM:   Results for orders placed or performed in visit on 03/23/21  POCT glucose (manual entry)  Result Value Ref Range   POC Glucose 268 (A) 70 - 99 mg/dl  POCT glycosylated hemoglobin (Hb A1C)  Result Value Ref Range   Hemoglobin A1C     HbA1c POC (<> result, manual entry)     HbA1c, POC (prediabetic range)     HbA1c, POC (controlled diabetic range) 9.5 (A) 0.0 - 7.0 %   reports elev BS x  59mh Checks in a.m before breakfast.  Range 200-250 Reports taking Metformin consistently Endorses frequent thirst.  No polyuria or blurred vision. Over due for eye exam.  Plans to schedule at WPembineless white carbs like rice and pasta. Just started exercising 2 days ago by walking on treadmill  HL:  last LDL was 126 on last vist.   Taking Pravachol QOD instead of daily because she gets pain in muscles when she tries to take it every day  C/o pain LT mid to lower abdomen down to upper thigh. More of a burning sensation than pain. Started 2 mths ago Intermittent. Can last a day but tolerable.  Today was her third day without the pain then started a little today  No dysuria or abn vaginal dischg.  No N/V.  No worse or better with food.  Takes Ibuprofen which helps.  Moving bowels okay.  Feels BS high and pain due to being a little stress over past mth after her son broke his wrist playing foot ball.  Patient Active Problem List   Diagnosis Date Noted   Generalized anxiety disorder 06/08/2020   Elevated blood pressure reading 02/07/2020   Mixed hyperlipidemia 02/07/2020   Hyperlipidemia associated with type 2 diabetes mellitus ([facility] 06/05/2019   Controlled type 2 diabetes mellitus without complication,  without long-term current use of insulin ([facility] 04/13/2018   Mastalgia in female 02/23/2017   NAFLD (nonalcoholic fatty liver disease) 02/23/2017   Class 1 obesity due to excess calories without serious comorbidity with body mass index (BMI) of 30.0 to 30.9 in adult 02/23/2017   Thyroid activity decreased 11/13/2014   Menorrhagia 04/02/2014     Current Outpatient Medications on File Prior to Visit  Medication Sig Dispense Refill   Blood Glucose Monitoring Suppl (TRUE METRIX METER) w/Device KIT 1 each by Does not apply route 3 (three) times daily. (Patient not taking: Reported on 08/10/2018) 1 kit 0   glucose blood test strip USE AS INSTRUCTED 100 strip 11   metFORMIN (GLUCOPHAGE) 500 MG tablet TAKE 1 TABLET (500 MG TOTAL) BY MOUTH 2 (TWO) TIMES DAILY WITH A MEAL. 180 tablet 3   Multiple Vitamin (MULTIVITAMIN) tablet Take 1 tablet by mouth daily. (Patient not taking: Reported on 03/23/2021)     omega-3 acid ethyl esters (LOVAZA) 1 G capsule Take by mouth 2 (two) times daily.     TRUEplus Lancets 28G MISC USE AS DIRECTED 100 each 11   No current facility-administered medications on file prior to visit.    Allergies  Allergen Reactions   Atorvastatin Other (See Comments)    Muscle pain    Social History   Socioeconomic History   Marital status:  Married    Spouse name: Not on file   Number of children: 3   Years of education: Not on file   Highest education level: Bachelor's degree (e.g., BA, AB, BS)  Occupational History   Not on file  Tobacco Use   Smoking status: Never   Smokeless tobacco: Never  Vaping Use   Vaping Use: Never used  Substance and Sexual Activity   Alcohol use: Yes    Comment: rarely   Drug use: No   Sexual activity: Yes    Partners: Male    Birth control/protection: None  Other Topics Concern   Not on file  Social History Narrative   Patient doesn't like to talk on the phone   Social Determinants of Health   Financial Resource Strain: Not on file   Food Insecurity: Not on file  Transportation Needs: Not on file  Physical Activity: Not on file  Stress: Not on file  Social Connections: Not on file  Intimate Partner Violence: Not on file    Family History  Problem Relation Age of Onset   Diabetes Father    Hypertension Father    Stroke Father    Hypertension Mother    Cancer Paternal Grandmother        stomach   Breast cancer Paternal Aunt     Past Surgical History:  Procedure Laterality Date   CESAREAN SECTION  1997, 1999, 2007   WISDOM TOOTH EXTRACTION      ROS: Review of Systems Negative except as stated above  PHYSICAL EXAM: BP 124/89   Pulse 70   Resp 16   Wt 182 lb 12.8 oz (82.9 kg)   SpO2 98%   BMI 31.38 kg/m   Wt Readings from Last 3 Encounters:  03/23/21 182 lb 12.8 oz (82.9 kg)  02/07/20 185 lb 3.2 oz (84 kg)  12/12/19 178 lb (80.7 kg)    Physical Exam  General appearance - alert, well appearing, and in no distress Mental status - normal mood, behavior, speech, dress, motor activity, and thought processes Neck - supple, no significant adenopathy Chest - clear to auscultation, no wheezes, rales or rhonchi, symmetric air entry Heart - normal rate, regular rhythm, normal S1, S2, no murmurs, rubs, clicks or gallops Abdomen - soft, nontender, nondistended, no masses or organomegaly Extremities - peripheral pulses normal, no pedal edema, no clubbing or cyanosis   CMP Latest Ref Rng & Units 11/23/2020 10/19/2020 06/09/2020  Glucose 65 - 99 mg/dL - - 144(H)  BUN 6 - 24 mg/dL - - 10  Creatinine 0.57 - 1.00 mg/dL - - 0.75  Sodium 134 - 144 mmol/L - - 138  Potassium 3.5 - 5.2 mmol/L - - 4.2  Chloride 96 - 106 mmol/L - - 101  CO2 20 - 29 mmol/L - - 23  Calcium 8.7 - 10.2 mg/dL - - 9.1  Total Protein 6.0 - 8.5 g/dL 7.7 7.7 7.2  Total Bilirubin 0.0 - 1.2 mg/dL 0.4 0.5 0.5  Alkaline Phos 44 - 121 IU/L 97 99 82  AST 0 - 40 IU/L 36 63(H) 30  ALT 0 - 32 IU/L 68(H) 86(H) 44(H)   Lipid Panel      Component Value Date/Time   CHOL 221 (H) 10/19/2020 1222   TRIG 313 (H) 10/19/2020 1222   HDL 39 (L) 10/19/2020 1222   CHOLHDL 5.7 (H) 10/19/2020 1222   CHOLHDL 3.8 02/24/2014 0900   VLDL 33 02/24/2014 0900   LDLCALC 126 (H) 10/19/2020 1222  CBC    Component Value Date/Time   WBC 7.0 10/19/2020 1222   WBC 6.0 11/13/2014 1137   RBC 4.61 10/19/2020 1222   RBC 4.23 11/13/2014 1137   HGB 13.1 10/19/2020 1222   HCT 39.3 10/19/2020 1222   PLT 282 10/19/2020 1222   MCV 85 10/19/2020 1222   MCH 28.4 10/19/2020 1222   MCH 28.6 11/13/2014 1137   MCHC 33.3 10/19/2020 1222   MCHC 34.0 11/13/2014 1137   RDW 12.3 10/19/2020 1222   LYMPHSABS 2.5 11/13/2014 1137   MONOABS 0.4 11/13/2014 1137   EOSABS 0.2 11/13/2014 1137   BASOSABS 0.0 11/13/2014 1137    ASSESSMENT AND PLAN:  1. Diabetes mellitus type 2 in obese [facility]) Not at goal.  Commended her on dietary changes that she has made so far.  Encouraged her to exercise at least 4 to 5 days a week for 30 minutes moderate intensity exercise. Continue metformin.  We discussed adding Trulicity once weekly to help better control blood sugars.  Patient informed that it has the added benefit of bringing about some weight loss.  I went over with her how the medication works and possible side effects.  Advised that the medication can cause some nausea as it slows down gastric emptying.  If she develops any vomiting or pain in the upper abdomen, she should stop the medication and come in as that can indicate that she may have developed pancreatitis from the medicine.  Patient is agreeable to trying the medication.  Continue to check blood sugars.  Clinical pharmacist to meet with her today for Trulicity administration teaching. - POCT glucose (manual entry) - POCT glycosylated hemoglobin (Hb A1C) - Dulaglutide (TRULICITY) 7.37 TG/6.2IR SOPN; Inject 0.75 mg into the skin once a week.  Dispense: 2 mL; Refill: 5  2. Mixed hyperlipidemia LDL not at  goal.  However she does get muscle aches with statin therapy.  So far she has been tolerating pravastatin 20 mg every other day.  I recommend that we try to increase it to 40 mg every other day.  Advised patient that if she develops muscle pain on the higher dose go back to taking 20 mg which means she would cut the 40 mg pill in half.  She expressed understanding.   - pravastatin (PRAVACHOL) 40 MG tablet; Take 1 tablet by mouth every other day.  Dispense: 90 tablet; Refill: 1 - Hepatic Function Panel  3. Elevated blood pressure reading Follow-up with clinical pharmacist in several weeks for blood pressure recheck.  DASH diet discussed and encouraged.  If blood pressure remains elevated on follow-up visit, I would recommend starting an ACE inhibitor  4. Left upper quadrant abdominal pain Exam unrevealing and benign at this time.  Patient advised that if things get worse she should follow-up with me  5. Screening for colon cancer - Fecal occult blood, imunochemical([facility])   Patient was given the opportunity to ask questions.  Patient verbalized understanding of the plan and was able to repeat key elements of the plan.  [employer] Language interpreter used during this encounter. #[id number], [place]  Orders Placed This Encounter  Procedures   Fecal occult blood, imunochemical([facility])   Hepatic Function Panel   POCT glucose (manual entry)   POCT glycosylated hemoglobin (Hb A1C)     Requested Prescriptions   Signed Prescriptions Disp Refills   Dulaglutide (TRULICITY) 7.03 JK/0.9FG SOPN 2 mL 5    Sig: Inject 0.75 mg into the skin once a week.   pravastatin (PRAVACHOL) 40  MG tablet 90 tablet 1    Sig: Take 1 tablet by mouth every other day.    Return in about 3 months (around 06/23/2021) for Give apptr with Lurena Joiner in 2 wks.  Karle Plumber, MD, FACP

## 2021-03-24 ENCOUNTER — Other Ambulatory Visit: Payer: Self-pay

## 2021-03-24 ENCOUNTER — Other Ambulatory Visit: Payer: Self-pay | Admitting: Internal Medicine

## 2021-03-24 DIAGNOSIS — E782 Mixed hyperlipidemia: Secondary | ICD-10-CM

## 2021-03-24 LAB — HEPATIC FUNCTION PANEL
ALT: 99 IU/L — ABNORMAL HIGH (ref 0–32)
AST: 59 IU/L — ABNORMAL HIGH (ref 0–40)
Albumin: 4.9 g/dL (ref 3.8–4.9)
Alkaline Phosphatase: 122 IU/L — ABNORMAL HIGH (ref 44–121)
Bilirubin Total: 0.5 mg/dL (ref 0.0–1.2)
Bilirubin, Direct: 0.12 mg/dL (ref 0.00–0.40)
Total Protein: 7.5 g/dL (ref 6.0–8.5)

## 2021-03-24 MED ORDER — PRAVASTATIN SODIUM 20 MG PO TABS
ORAL_TABLET | ORAL | 1 refills | Status: DC
  Filled 2021-03-24: qty 12, 28d supply, fill #0
  Filled 2021-05-11: qty 12, 28d supply, fill #1
  Filled 2021-06-15: qty 12, 28d supply, fill #2
  Filled 2021-07-14: qty 12, 28d supply, fill #3
  Filled 2021-08-27: qty 12, 28d supply, fill #4

## 2021-03-24 MED FILL — Glucose Blood Test Strip: 25 days supply | Qty: 100 | Fill #0 | Status: AC

## 2021-03-24 MED FILL — Lancets: 25 days supply | Qty: 100 | Fill #0 | Status: AC

## 2021-03-24 NOTE — Progress Notes (Signed)
Let patient know that her liver function tests are mildly increased again.  Likely due to a combination of having fatty liver and use of cholesterol medication.  Yesterday we discussed increasing the cholesterol medicine to 40 mg every other day.  Please let her know that based on these results I want her to continue taking just the 20 mg and take it every Monday, Wed and Friday. Return to the lab in 2 wks for recheck ofliver function.

## 2021-03-30 ENCOUNTER — Other Ambulatory Visit: Payer: Self-pay

## 2021-03-30 MED FILL — Metformin HCl Tab 500 MG: ORAL | 30 days supply | Qty: 60 | Fill #2 | Status: AC

## 2021-04-01 LAB — FECAL OCCULT BLOOD, IMMUNOCHEMICAL: Fecal Occult Bld: NEGATIVE

## 2021-04-02 NOTE — Progress Notes (Signed)
As instructed by MD/NP/PA called pt/ name and DOB verified/ made aware of results note and instructions . Verbalized understanding  

## 2021-04-06 ENCOUNTER — Ambulatory Visit: Payer: Self-pay | Attending: Internal Medicine | Admitting: Pharmacist

## 2021-04-06 ENCOUNTER — Encounter: Payer: Self-pay | Admitting: Pharmacist

## 2021-04-06 ENCOUNTER — Other Ambulatory Visit: Payer: Self-pay

## 2021-04-06 DIAGNOSIS — E669 Obesity, unspecified: Secondary | ICD-10-CM

## 2021-04-06 DIAGNOSIS — E1169 Type 2 diabetes mellitus with other specified complication: Secondary | ICD-10-CM

## 2021-04-06 LAB — GLUCOSE, POCT (MANUAL RESULT ENTRY): POC Glucose: 120 mg/dl — AB (ref 70–99)

## 2021-04-06 NOTE — Progress Notes (Signed)
    S:    PCP: Dr. Laural Benes   No chief complaint on file.  Patient arrives in good spirits. Presents for diabetes evaluation, education, and management. Patient was referred and last seen by Primary Care Provider on 03/23/2021. Trulicity was added at that visit.    Patient reports Diabetes was diagnosed in 2019. Denies any prior hospitalization for DKA. She has never taken insulin. Denies hx of pancreatitis. She does not have any CAD or clinical ASCVD. No CHF, CKD, or stroke hx. She is tolerating the Trulicity well. Denies NV, abdominal pain. Denies any changes in vision.   Family/Social History:  -Fhx: DM, HTN, stroke -Tobacco: never smoker -Alcohol: denied use   Insurance coverage/medication affordability: Dnbi  Medication adherence reported.   Current diabetes medications include: metformin 500 mg BID (could not tolerate 1000 mg BID dose d/t dizziness), Trulicity 0.75 mg weekly   Patient denies hypoglycemic events.  Patient reported dietary habits:  - Reports that she is watching what she eats  - Has decreased amount of carbs and sugars in her diet   Patient-reported exercise habits:  - Walks ~1 hour daily (M-F)   Patient denies nocturia (nighttime urination).  Patient denies neuropathy (nerve pain). Patient denies visual changes. Patient reports self foot exams.     O:  POCT: 120  Lab Results  Component Value Date   HGBA1C 9.5 (A) 03/23/2021   There were no vitals filed for this visit.  Lipid Panel     Component Value Date/Time   CHOL 221 (H) 10/19/2020 1222   TRIG 313 (H) 10/19/2020 1222   HDL 39 (L) 10/19/2020 1222   CHOLHDL 5.7 (H) 10/19/2020 1222   CHOLHDL 3.8 02/24/2014 0900   VLDL 33 02/24/2014 0900   LDLCALC 126 (H) 10/19/2020 1222    Home fasting blood sugars: 107 - 120s  2 hour post-meal/random blood sugars: 120s.  Clinical Atherosclerotic Cardiovascular Disease (ASCVD): No  The 10-year ASCVD risk score Denman George DC Jr., et al., 2013) is: 4.2%    Values used to calculate the score:     Age: 50 years     Sex: Female     Is Non-Hispanic African American: No     Diabetic: Yes     Tobacco smoker: No     Systolic Blood Pressure: 124 mmHg     Is BP treated: No     HDL Cholesterol: 39 mg/dL     Total Cholesterol: 221 mg/dL   A/P: Diabetes longstanding currently uncontrolled based on A1c, however, home glycemic control is improving. Patient is able to verbalize appropriate hypoglycemia management plan. Medication adherence appears appropriate. Will have her return in 2 weeks for possible Trulicity dose titration.  -Continued current regimen for now.  -Extensively discussed pathophysiology of diabetes, recommended lifestyle interventions, dietary effects on blood sugar control -Counseled on s/sx of and management of hypoglycemia -Next A1C anticipated 06/2021.   Written patient instructions provided. Total time in face to face counseling 30 minutes.   Follow up Pharmacist Clinic Visit in 2 weeks.  Butch Penny, PharmD, Patsy [place], CPP Clinical Pharmacist [facility] [phone]

## 2021-04-09 ENCOUNTER — Telehealth: Payer: Self-pay

## 2021-04-09 NOTE — Telephone Encounter (Signed)
Contacted pt to go over lab results pt didn't answer lvm   [employer] interperter Kevin ID [id number]  Sent a CRM and forward labs to NT to give pt labs when they call back   

## 2021-04-20 ENCOUNTER — Other Ambulatory Visit: Payer: Self-pay

## 2021-04-20 ENCOUNTER — Ambulatory Visit: Payer: Self-pay | Attending: Internal Medicine | Admitting: Pharmacist

## 2021-04-20 ENCOUNTER — Encounter: Payer: Self-pay | Admitting: Pharmacist

## 2021-04-20 DIAGNOSIS — E669 Obesity, unspecified: Secondary | ICD-10-CM

## 2021-04-20 DIAGNOSIS — E1169 Type 2 diabetes mellitus with other specified complication: Secondary | ICD-10-CM

## 2021-04-20 LAB — GLUCOSE, POCT (MANUAL RESULT ENTRY): POC Glucose: 127 mg/dl — AB (ref 70–99)

## 2021-04-20 NOTE — Progress Notes (Signed)
    S:    PCP: Dr. Laural Benes   No chief complaint on file.  Patient arrives in good spirits. Presents for diabetes evaluation, education, and management. Patient was referred and last seen by Primary Care Provider on 03/23/2021. Trulicity was added at that visit.  I saw her on 04/06/2021 and made no changes.   She continues to tolerate the Trulicity well. Denies NV, abdominal pain. Denies any changes in vision. She has completed a month of the 0.75 mg weekly dose.   Family/Social History:  -Fhx: DM, HTN, stroke -Tobacco: never smoker -Alcohol: denied use   Insurance coverage/medication affordability: Dnbi  Medication adherence reported.   Current diabetes medications include: metformin 500 mg BID (could not tolerate 1000 mg BID dose d/t dizziness), Trulicity 0.75 mg weekly   Patient denies hypoglycemic events.  Patient reported dietary habits:  - Reports that she is watching what she eats  - Has decreased amount of carbs and sugars in her diet   Patient-reported exercise habits:  - Walks ~1 hour daily (M-F)   Patient denies nocturia (nighttime urination).  Patient denies neuropathy (nerve pain). Patient denies visual changes. Patient reports self foot exams.     O:  POCT: 127  Lab Results  Component Value Date   HGBA1C 9.5 (A) 03/23/2021   There were no vitals filed for this visit.  Lipid Panel     Component Value Date/Time   CHOL 221 (H) 10/19/2020 1222   TRIG 313 (H) 10/19/2020 1222   HDL 39 (L) 10/19/2020 1222   CHOLHDL 5.7 (H) 10/19/2020 1222   CHOLHDL 3.8 02/24/2014 0900   VLDL 33 02/24/2014 0900   LDLCALC 126 (H) 10/19/2020 1222   Home fasting blood sugars: 90s - 120s  2 hour post-meal/random blood sugars: not checking  Clinical Atherosclerotic Cardiovascular Disease (ASCVD): No  The 10-year ASCVD risk score Denman George DC Jr., et al., 2013) is: 4.2%   Values used to calculate the score:     Age: 50 years     Sex: Female     Is Non-Hispanic African  American: No     Diabetic: Yes     Tobacco smoker: No     Systolic Blood Pressure: 124 mmHg     Is BP treated: No     HDL Cholesterol: 39 mg/dL     Total Cholesterol: 221 mg/dL   A/P: Diabetes longstanding currently uncontrolled based on A1c, however, home glycemic control is at goal. Today's clinic CBG is at goal. Patient is able to verbalize appropriate hypoglycemia management plan. Medication adherence appears appropriate. She declines increasing Trulicity to goal dose. She knows of the added CV and weight loss benefit with the 1.5 mg dose but wishes to remain on the 0.75 mg weekly dose for now.  -Continued current regimen for now.  -Extensively discussed pathophysiology of diabetes, recommended lifestyle interventions, dietary effects on blood sugar control -Counseled on s/sx of and management of hypoglycemia -Next A1C anticipated 06/2021.   Written patient instructions provided. Total time in face to face counseling 30 minutes.   Follow up w/ PCP.   Butch Penny, PharmD, P[place]y [place], CPP Clinical Pharmacist [facility] [phone]

## 2021-05-11 ENCOUNTER — Other Ambulatory Visit: Payer: Self-pay

## 2021-05-11 MED FILL — Metformin HCl Tab 500 MG: ORAL | 30 days supply | Qty: 60 | Fill #3 | Status: AC

## 2021-05-12 ENCOUNTER — Other Ambulatory Visit: Payer: Self-pay

## 2021-06-15 ENCOUNTER — Other Ambulatory Visit: Payer: Self-pay

## 2021-06-15 MED FILL — Metformin HCl Tab 500 MG: ORAL | 30 days supply | Qty: 60 | Fill #4 | Status: AC

## 2021-06-24 ENCOUNTER — Other Ambulatory Visit: Payer: Self-pay

## 2021-06-24 ENCOUNTER — Ambulatory Visit: Payer: Self-pay | Attending: Internal Medicine | Admitting: Internal Medicine

## 2021-06-24 VITALS — BP 122/85 | HR 68 | Resp 16 | Wt 175.4 lb

## 2021-06-24 DIAGNOSIS — E669 Obesity, unspecified: Secondary | ICD-10-CM

## 2021-06-24 DIAGNOSIS — R945 Abnormal results of liver function studies: Secondary | ICD-10-CM

## 2021-06-24 DIAGNOSIS — R030 Elevated blood-pressure reading, without diagnosis of hypertension: Secondary | ICD-10-CM

## 2021-06-24 DIAGNOSIS — E1169 Type 2 diabetes mellitus with other specified complication: Secondary | ICD-10-CM

## 2021-06-24 DIAGNOSIS — Z23 Encounter for immunization: Secondary | ICD-10-CM

## 2021-06-24 DIAGNOSIS — R7989 Other specified abnormal findings of blood chemistry: Secondary | ICD-10-CM

## 2021-06-24 DIAGNOSIS — E782 Mixed hyperlipidemia: Secondary | ICD-10-CM

## 2021-06-24 LAB — POCT GLYCOSYLATED HEMOGLOBIN (HGB A1C): HbA1c, POC (controlled diabetic range): 6 % (ref 0.0–7.0)

## 2021-06-24 LAB — GLUCOSE, POCT (MANUAL RESULT ENTRY): POC Glucose: 108 mg/dl — AB (ref 70–99)

## 2021-06-24 NOTE — Progress Notes (Signed)
Patient ID: Linda Delgado, female    DOB: [birth year]  MRN: [id number]  CC: Diabetes   Subjective: Linda Delgado is a 50 y.o. female who presents for chronic ds management Her concerns today include:  Pt with hx of NAFLD, obesity, DM, HL, anxiety.  DM Results for orders placed or performed in visit on 06/24/21  POCT glucose (manual entry)  Result Value Ref Range   POC Glucose 108 (A) 70 - 99 mg/dl  POCT glycosylated hemoglobin (Hb A1C)  Result Value Ref Range   Hemoglobin A1C     HbA1c POC (<> result, manual entry)     HbA1c, POC (prediabetic range)     HbA1c, POC (controlled diabetic range) 6.0 0.0 - 7.0 %  A1C on last visit was 9.5 -On last visit Trulicity was added to Metformin.  Tolerating the Trulicity.  It has decreased her appetite.  Loss 7 lbs since last visit. Goes to gym 2-3x/wk to use treadmill.  Pravachol was increased to 40 mg QOD. However, LFTs were elev again so I advised to go back to 77m QOD.  She is taking the 20 mg as instructed Not able to afford eye exam DBP was mildly elev on last visit.  She has been limiting salt in foods Patient Active Problem List   Diagnosis Date Noted   Generalized anxiety disorder 06/08/2020   Elevated blood pressure reading 02/07/2020   Mixed hyperlipidemia 02/07/2020   Hyperlipidemia associated with type 2 diabetes mellitus ([place] 06/05/2019   Controlled type 2 diabetes mellitus without complication, without long-term current use of insulin ([place] 04/13/2018   Mastalgia in female 02/23/2017   NAFLD (nonalcoholic fatty liver disease) 02/23/2017   Class 1 obesity due to excess calories without serious comorbidity with body mass index (BMI) of 30.0 to 30.9 in adult 02/23/2017   Thyroid activity decreased 11/13/2014   Menorrhagia 04/02/2014     Current Outpatient Medications on File Prior to Visit  Medication Sig Dispense Refill   Blood Glucose Monitoring Suppl (TRUE METRIX METER) w/Device KIT 1 each by  Does not apply route 3 (three) times daily. (Patient not taking: Reported on 08/10/2018) 1 kit 0   Dulaglutide (TRULICITY) 01.19MJY/7.8GNSOPN Inject 0.75 mg into the skin once a week. 2 mL 5   metFORMIN (GLUCOPHAGE) 500 MG tablet TAKE 1 TABLET (500 MG TOTAL) BY MOUTH 2 (TWO) TIMES DAILY WITH A MEAL. 180 tablet 3   Multiple Vitamin (MULTIVITAMIN) tablet Take 1 tablet by mouth daily. (Patient not taking: Reported on 03/23/2021)     omega-3 acid ethyl esters (LOVAZA) 1 G capsule Take by mouth 2 (two) times daily.     pravastatin (PRAVACHOL) 20 MG tablet Take 1 tablet by mouth every Mon/Wed/Fri 30 tablet 1   No current facility-administered medications on file prior to visit.    Allergies  Allergen Reactions   Atorvastatin Other (See Comments)    Muscle pain    Social History   Socioeconomic History   Marital status: Married    Spouse name: Not on file   Number of children: 3   Years of education: Not on file   Highest education level: Bachelor's degree (e.g., BA, AB, BS)  Occupational History   Not on file  Tobacco Use   Smoking status: Never   Smokeless tobacco: Never  Vaping Use   Vaping Use: Never used  Substance and Sexual Activity   Alcohol use: Yes    Comment: rarely   Drug use: No  Sexual activity: Yes    Partners: Male    Birth control/protection: None  Other Topics Concern   Not on file  Social History Narrative   Patient doesn't like to talk on the phone   Social Determinants of Health   Financial Resource Strain: Not on file  Food Insecurity: Not on file  Transportation Needs: Not on file  Physical Activity: Not on file  Stress: Not on file  Social Connections: Not on file  Intimate Partner Violence: Not on file    Family History  Problem Relation Age of Onset   Diabetes Father    Hypertension Father    Stroke Father    Hypertension Mother    Cancer Paternal Grandmother        stomach   Breast cancer Paternal Aunt     Past Surgical History:   Procedure Laterality Date   CESAREAN SECTION  1997, 1999, 2007   WISDOM TOOTH EXTRACTION      ROS: Review of Systems Negative except as stated above  PHYSICAL EXAM: BP 122/85   Pulse 68   Resp 16   Wt 175 lb 6.4 oz (79.6 kg)   SpO2 98%   BMI 30.11 kg/m   Wt Readings from Last 3 Encounters:  06/24/21 175 lb 6.4 oz (79.6 kg)  03/23/21 182 lb 12.8 oz (82.9 kg)  02/07/20 185 lb 3.2 oz (84 kg)  130/88  Physical Exam  General appearance - alert, well appearing, and in no distress Mental status - alert, oriented to person, place, and time Eyes - pupils equal and reactive, extraocular eye movements intact Chest - clear to auscultation, no wheezes, rales or rhonchi, symmetric air entry Heart - normal rate, regular rhythm, normal S1, S2, no murmurs, rubs, clicks or gallops Extremities - peripheral pulses normal, no pedal edema, no clubbing or cyanosis Diabetic Foot Exam - Simple   Simple Foot Form Visual Inspection See comments: Yes Sensation Testing Intact to touch and monofilament testing bilaterally: Yes Pulse Check Posterior Tibialis and Dorsalis pulse intact bilaterally: Yes Comments Noninflamed bunions of both big toes.      CMP Latest Ref Rng & Units 03/23/2021 11/23/2020 10/19/2020  Glucose 65 - 99 mg/dL - - -  BUN 6 - 24 mg/dL - - -  Creatinine 0.57 - 1.00 mg/dL - - -  Sodium 134 - 144 mmol/L - - -  Potassium 3.5 - 5.2 mmol/L - - -  Chloride 96 - 106 mmol/L - - -  CO2 20 - 29 mmol/L - - -  Calcium 8.7 - 10.2 mg/dL - - -  Total Protein 6.0 - 8.5 g/dL 7.5 7.7 7.7  Total Bilirubin 0.0 - 1.2 mg/dL 0.5 0.4 0.5  Alkaline Phos 44 - 121 IU/L 122(H) 97 99  AST 0 - 40 IU/L 59(H) 36 63(H)  ALT 0 - 32 IU/L 99(H) 68(H) 86(H)   Lipid Panel     Component Value Date/Time   CHOL 221 (H) 10/19/2020 1222   TRIG 313 (H) 10/19/2020 1222   HDL 39 (L) 10/19/2020 1222   CHOLHDL 5.7 (H) 10/19/2020 1222   CHOLHDL 3.8 02/24/2014 0900   VLDL 33 02/24/2014 0900   LDLCALC 126 (H)  10/19/2020 1222    CBC    Component Value Date/Time   WBC 7.0 10/19/2020 1222   WBC 6.0 11/13/2014 1137   RBC 4.61 10/19/2020 1222   RBC 4.23 11/13/2014 1137   HGB 13.1 10/19/2020 1222   HCT 39.3 10/19/2020 1222   PLT 282  10/19/2020 1222   MCV 85 10/19/2020 1222   MCH 28.4 10/19/2020 1222   MCH 28.6 11/13/2014 1137   MCHC 33.3 10/19/2020 1222   MCHC 34.0 11/13/2014 1137   RDW 12.3 10/19/2020 1222   LYMPHSABS 2.5 11/13/2014 1137   MONOABS 0.4 11/13/2014 1137   EOSABS 0.2 11/13/2014 1137   BASOSABS 0.0 11/13/2014 1137    ASSESSMENT AND PLAN:  1. Diabetes mellitus type 2 in obese ([place]) Commended her on weight loss and getting the A1c below 7.  She will continue the Trulicity and metformin.  Encouraged her to continue healthy eating habits and regular exercise. urged to get eye exam when she is able to afford. - POCT glucose (manual entry) - POCT glycosylated hemoglobin (Hb A1C) - Microalbumin / creatinine urine ratio  2. Elevated blood pressure reading Continue to limit salt in the foods.  Diastolic pressure remains slightly elevated.  If still elevated on next visit, I told her we will need to add a medication to help lower the blood pressure.  3. Abnormal LFTs - Hepatic function panel  4. Mixed hyperlipidemia Continue pravastatin 20 mg every other day.  5. Need for immunization against influenza - Flu Vaccine QUAD 89moIM (Fluarix, Fluzone & Alfiuria Quad PF)     Patient was given the opportunity to ask questions.  Patient verbalized understanding of the plan and was able to repeat key elements of the plan.  [employer] Language interpreter used during this encounter. ##[id number] Sanger Orders Placed This Encounter  Procedures   Flu Vaccine QUAD 661moM (Fluarix, Fluzone & Alfiuria Quad PF)   Microalbumin / creatinine urine ratio   Hepatic function panel   POCT glucose (manual entry)   POCT glycosylated hemoglobin (Hb A1C)     Requested Prescriptions    No  prescriptions requested or ordered in this encounter    Return in about 4 months (around 10/24/2021).  DeKarle PlumberMD, FACP

## 2021-06-25 ENCOUNTER — Telehealth: Payer: Self-pay

## 2021-06-25 LAB — HEPATIC FUNCTION PANEL
ALT: 28 IU/L (ref 0–32)
AST: 22 IU/L (ref 0–40)
Albumin: 4.9 g/dL (ref 3.8–4.9)
Alkaline Phosphatase: 90 IU/L (ref 44–121)
Bilirubin Total: 0.4 mg/dL (ref 0.0–1.2)
Bilirubin, Direct: 0.11 mg/dL (ref 0.00–0.40)
Total Protein: 7.5 g/dL (ref 6.0–8.5)

## 2021-06-25 LAB — MICROALBUMIN / CREATININE URINE RATIO
Creatinine, Urine: 68.7 mg/dL
Microalb/Creat Ratio: 11 mg/g creat (ref 0–29)
Microalbumin, Urine: 7.6 ug/mL

## 2021-06-25 NOTE — Telephone Encounter (Signed)
Contacted pt to go over lab results pt didn't answer and was unable to lvm  [employer] interperter: josbel ID: [id number] Sent a CRM and forward labs to NT to give pt labs when they call back

## 2021-07-14 ENCOUNTER — Other Ambulatory Visit: Payer: Self-pay

## 2021-07-14 MED FILL — Metformin HCl Tab 500 MG: ORAL | 30 days supply | Qty: 60 | Fill #5 | Status: AC

## 2021-07-15 ENCOUNTER — Other Ambulatory Visit: Payer: Self-pay

## 2021-08-11 ENCOUNTER — Other Ambulatory Visit: Payer: Self-pay

## 2021-08-13 ENCOUNTER — Other Ambulatory Visit: Payer: Self-pay

## 2021-08-27 ENCOUNTER — Other Ambulatory Visit: Payer: Self-pay

## 2021-08-27 MED FILL — Metformin HCl Tab 500 MG: ORAL | 30 days supply | Qty: 60 | Fill #6 | Status: AC

## 2021-08-30 ENCOUNTER — Other Ambulatory Visit: Payer: Self-pay

## 2021-09-28 ENCOUNTER — Other Ambulatory Visit: Payer: Self-pay

## 2021-09-28 ENCOUNTER — Other Ambulatory Visit: Payer: Self-pay | Admitting: Internal Medicine

## 2021-09-28 DIAGNOSIS — E1169 Type 2 diabetes mellitus with other specified complication: Secondary | ICD-10-CM

## 2021-09-28 DIAGNOSIS — E782 Mixed hyperlipidemia: Secondary | ICD-10-CM

## 2021-09-28 MED FILL — Metformin HCl Tab 500 MG: ORAL | 30 days supply | Qty: 60 | Fill #7 | Status: AC

## 2021-09-29 ENCOUNTER — Other Ambulatory Visit: Payer: Self-pay

## 2021-09-29 MED ORDER — PRAVASTATIN SODIUM 20 MG PO TABS
ORAL_TABLET | ORAL | 2 refills | Status: DC
  Filled 2021-09-29: qty 12, 28d supply, fill #0

## 2021-09-29 MED ORDER — TRULICITY 0.75 MG/0.5ML ~~LOC~~ SOAJ
0.7500 mg | SUBCUTANEOUS | 2 refills | Status: DC
  Filled 2021-09-29: qty 2, 28d supply, fill #0

## 2021-09-29 NOTE — Telephone Encounter (Signed)
Requested Prescriptions  Pending Prescriptions Disp Refills   Dulaglutide (TRULICITY) 0.75 MG/0.5ML SOPN 2 mL 2    Sig: Inject 0.75 mg into the skin once a week.     Endocrinology:  Diabetes - GLP-1 Receptor Agonists Passed - 09/28/2021  2:20 PM      Passed - HBA1C is between 0 and 7.9 and within 180 days    HbA1c, POC (prediabetic range)  Date Value Ref Range Status  02/07/2020 6.3 5.7 - 6.4 % Final   HbA1c, POC (controlled diabetic range)  Date Value Ref Range Status  06/24/2021 6.0 0.0 - 7.0 % Final         Passed - Valid encounter within last 6 months    Recent Outpatient Visits          3 months ago Diabetes mellitus type 2 in obese Linda Delgado)   Linda Delgado Linda Blue Delgado, Linda Delgado   5 months ago Diabetes mellitus type 2 in obese Linda Delgado)   Linda Delgado [name], Linda Delgado, Linda Delgado   5 months ago Diabetes mellitus type 2 in obese Linda Delgado)   Linda Delgado Linda Delgado, Linda Delgado   6 months ago Diabetes mellitus type 2 in obese Linda Delgado)   Linda Delgado Linda Matar, Linda Delgado   11 months ago Pharyngitis, unspecified etiology   Linda Delgado Linda Matar, Linda Delgado      Future Appointments            In 3 weeks Linda Matar, Linda Delgado [facility]            Linda (PRAVACHOL) 20 MG tablet 30 tablet 1    Sig: Take 1 tablet by mouth every Mon/Wed/Fri     Cardiovascular:  Antilipid - Statins Failed - 09/28/2021  2:20 PM      Failed - Total Cholesterol in normal range and within 360 days    Cholesterol, Total  Date Value Ref Range Status  10/19/2020 221 (H) 100 - 199 mg/dL Final         Failed - LDL in normal range and within 360 days    LDL Chol Calc (NIH)  Date Value Ref Range Status  10/19/2020 126 (H) 0 - 99 mg/dL Final         Failed - HDL in normal range and within 360 days    HDL   Date Value Ref Range Status  10/19/2020 39 (L) >39 mg/dL Final         Failed - Triglycerides in normal range and within 360 days    Triglycerides  Date Value Ref Range Status  10/19/2020 313 (H) 0 - 149 mg/dL Final         Passed - Patient is not pregnant      Passed - Valid encounter within last 12 months    Recent Outpatient Visits          3 months ago Diabetes mellitus type 2 in obese [name]   [name], Linda Delgado   5 months ago Diabetes mellitus type 2 in obese [name]   [place] [place], [name], Linda Delgado   5 months ago Diabetes mellitus type 2 in obese [name]   [name], [name], Linda Delgado   6 months ago Diabetes mellitus type  2 in obese [name]   [name], Linda Delgado   11 months ago Pharyngitis, unspecified etiology   [name], Linda Delgado      Future Appointments            In 3 weeks [name], Linda Delgado [name]

## 2021-10-25 ENCOUNTER — Other Ambulatory Visit: Payer: Self-pay

## 2021-10-25 ENCOUNTER — Ambulatory Visit: Payer: Self-pay | Attending: Internal Medicine | Admitting: Internal Medicine

## 2021-10-25 DIAGNOSIS — Z23 Encounter for immunization: Secondary | ICD-10-CM

## 2021-10-25 DIAGNOSIS — E669 Obesity, unspecified: Secondary | ICD-10-CM

## 2021-10-25 DIAGNOSIS — E1169 Type 2 diabetes mellitus with other specified complication: Secondary | ICD-10-CM

## 2021-10-25 DIAGNOSIS — E782 Mixed hyperlipidemia: Secondary | ICD-10-CM

## 2021-10-25 MED ORDER — PRAVASTATIN SODIUM 20 MG PO TABS
ORAL_TABLET | ORAL | 2 refills | Status: DC
  Filled 2021-10-25: qty 30, 30d supply, fill #0

## 2021-10-25 MED ORDER — TRULICITY 0.75 MG/0.5ML ~~LOC~~ SOAJ
0.7500 mg | SUBCUTANEOUS | 6 refills | Status: DC
  Filled 2021-10-25: qty 2, 28d supply, fill #0
  Filled 2021-11-30: qty 2, 28d supply, fill #1
  Filled 2021-12-31: qty 2, 28d supply, fill #2
  Filled 2022-01-31: qty 2, 28d supply, fill #3
  Filled 2022-03-04: qty 2, 28d supply, fill #4
  Filled 2022-04-08: qty 2, 28d supply, fill #5
  Filled 2022-05-06: qty 2, 28d supply, fill #6

## 2021-10-25 NOTE — Progress Notes (Signed)
Patient ID: Linda Delgado, female   DOB: [birth year], 51 y.o.   MRN: [id number] Virtual Visit via Telephone Note  I connected with Linda Delgado on 10/25/2021 at 2:53 p.m by telephone and verified that I am speaking with the correct person using two identifiers  Location: Patient: home Provider: office  Participants: Myself Patient Interpreter:  [facility] Tonia [place], [phone]   I discussed the limitations, risks, security and privacy concerns of performing an evaluation and management service by telephone and the availability of in person appointments. I also discussed with the patient that there may be a patient responsible charge related to this service. The patient expressed understanding and agreed to proceed.   History of Present Illness: Pt with hx of NAFLD, obesity, DM, HL, anxiety.  Patient last seen 06/24/2021.  Purpose of today's visit is for chronic disease management.  DM/HL:  on Trulicity 8.45 mg and Metformin 500 mg BID.  Confirms taking meds and tolerating meds.  Last A1C was 6 Checks BS QOD.  Gives range of 117-126 Trying to eat as healthy as possible.   Using treadmill 3x/wk.  Wgh a few days ago was 176 lbs, up 1 lbs from last visit. Taking Pravachol 20 mg QOD and tolerating.  Last LFT was normal.    HM:  due for PCV 15 and Shingrix Outpatient Encounter Medications as of 10/25/2021  Medication Sig   Blood Glucose Monitoring Suppl (TRUE METRIX METER) w/Device KIT 1 each by Does not apply route 3 (three) times daily. (Patient not taking: Reported on 08/10/2018)   Dulaglutide (TRULICITY) 3.64 WO/0.3OZ SOPN Inject 0.75 mg into the skin once a week.   metFORMIN (GLUCOPHAGE) 500 MG tablet TAKE 1 TABLET (500 MG TOTAL) BY MOUTH 2 (TWO) TIMES DAILY WITH A MEAL.   Multiple Vitamin (MULTIVITAMIN) tablet Take 1 tablet by mouth daily. (Patient not taking: Reported on 03/23/2021)   omega-3 acid ethyl esters (LOVAZA) 1 G capsule Take by mouth 2 (two) times  daily.   pravastatin (PRAVACHOL) 20 MG tablet Take 1 tablet by mouth every Mon/Wed/Fri   No facility-administered encounter medications on file as of 10/25/2021.      Observations/Objective: No direct observation done as this was a telephone visit. Results for orders placed or performed in visit on 06/24/21  Microalbumin / creatinine urine ratio  Result Value Ref Range   Creatinine, Urine 68.7 Not Estab. mg/dL   Microalbumin, Urine 7.6 Not Estab. ug/mL   Microalb/Creat Ratio 11 0 - 29 mg/g creat  Hepatic function panel  Result Value Ref Range   Total Protein 7.5 6.0 - 8.5 g/dL   Albumin 4.9 3.8 - 4.9 g/dL   Bilirubin Total 0.4 0.0 - 1.2 mg/dL   Bilirubin, Direct 0.11 0.00 - 0.40 mg/dL   Alkaline Phosphatase 90 44 - 121 IU/L   AST 22 0 - 40 IU/L   ALT 28 0 - 32 IU/L  POCT glucose (manual entry)  Result Value Ref Range   POC Glucose 108 (A) 70 - 99 mg/dl  POCT glycosylated hemoglobin (Hb A1C)  Result Value Ref Range   Hemoglobin A1C     HbA1c POC (<> result, manual entry)     HbA1c, POC (prediabetic range)     HbA1c, POC (controlled diabetic range) 6.0 0.0 - 7.0 %     Assessment and Plan: 1. Diabetes mellitus type 2 in obese (H[facility]) Blood sugars at goal.  She will continue metformin and Trulicity.  Encouraged her to continue healthy eating habits and  regular exercise. - CBC; Future - Comprehensive metabolic panel; Future - Hemoglobin A1c; Future - Dulaglutide (TRULICITY) 9.41 DE/0.8XK SOPN; Inject 0.75 mg into the skin once a week.  Dispense: 2 mL; Refill: 6  2. Mixed hyperlipidemia Continue Pravachol 3 days a week.  We will recheck liver function test to make sure they are staying stable. - Lipid panel; Future - pravastatin (PRAVACHOL) 20 MG tablet; Take 1 tablet by mouth every Mon/Wed/Fri  Dispense: 30 tablet; Refill: 2  3. Need for vaccination against Streptococcus pneumoniae 4. Need for shingles vaccine We will have her schedule with the clinical pharmacist to get  PCV 15 and first Shingrix vaccine.   Follow Up Instructions: Follow-up with me in 4 months. Patient will be given appointment for the lab for 10/28/2021.  She is agreeable to receiving PCV 15 and Shingrix No. 1 on the same day.  Message sent to scheduler.  I discussed the assessment and treatment plan with the patient. The patient was provided an opportunity to ask questions and all were answered. The patient agreed with the plan and demonstrated an understanding of the instructions.   The patient was advised to call back or seek an in-person evaluation if the symptoms worsen or if the condition fails to improve as anticipated.  I  Spent 11 minutes on this telephone encounter  Karle Plumber, MD

## 2021-10-28 ENCOUNTER — Ambulatory Visit: Payer: No Typology Code available for payment source

## 2021-10-28 ENCOUNTER — Ambulatory Visit: Payer: No Typology Code available for payment source | Attending: Internal Medicine

## 2021-10-28 ENCOUNTER — Other Ambulatory Visit: Payer: Self-pay

## 2021-10-28 DIAGNOSIS — E782 Mixed hyperlipidemia: Secondary | ICD-10-CM

## 2021-10-28 DIAGNOSIS — E669 Obesity, unspecified: Secondary | ICD-10-CM

## 2021-10-28 DIAGNOSIS — E1169 Type 2 diabetes mellitus with other specified complication: Secondary | ICD-10-CM

## 2021-10-29 ENCOUNTER — Other Ambulatory Visit: Payer: Self-pay | Admitting: Internal Medicine

## 2021-10-29 ENCOUNTER — Other Ambulatory Visit: Payer: Self-pay

## 2021-10-29 ENCOUNTER — Telehealth: Payer: Self-pay

## 2021-10-29 ENCOUNTER — Other Ambulatory Visit: Payer: Self-pay | Admitting: Pharmacist

## 2021-10-29 DIAGNOSIS — E782 Mixed hyperlipidemia: Secondary | ICD-10-CM

## 2021-10-29 DIAGNOSIS — E1169 Type 2 diabetes mellitus with other specified complication: Secondary | ICD-10-CM

## 2021-10-29 DIAGNOSIS — E669 Obesity, unspecified: Secondary | ICD-10-CM

## 2021-10-29 LAB — COMPREHENSIVE METABOLIC PANEL
ALT: 32 IU/L (ref 0–32)
AST: 23 IU/L (ref 0–40)
Albumin/Globulin Ratio: 2.3 — ABNORMAL HIGH (ref 1.2–2.2)
Albumin: 4.8 g/dL (ref 3.8–4.9)
Alkaline Phosphatase: 83 IU/L (ref 44–121)
BUN/Creatinine Ratio: 18 (ref 9–23)
BUN: 13 mg/dL (ref 6–24)
Bilirubin Total: 0.4 mg/dL (ref 0.0–1.2)
CO2: 23 mmol/L (ref 20–29)
Calcium: 9.5 mg/dL (ref 8.7–10.2)
Chloride: 103 mmol/L (ref 96–106)
Creatinine, Ser: 0.72 mg/dL (ref 0.57–1.00)
Globulin, Total: 2.1 g/dL (ref 1.5–4.5)
Glucose: 120 mg/dL — ABNORMAL HIGH (ref 70–99)
Potassium: 4.6 mmol/L (ref 3.5–5.2)
Sodium: 141 mmol/L (ref 134–144)
Total Protein: 6.9 g/dL (ref 6.0–8.5)
eGFR: 101 mL/min/{1.73_m2} (ref >59)

## 2021-10-29 LAB — CBC
Hematocrit: 39.8 % (ref 34.0–46.6)
Hemoglobin: 12.9 g/dL (ref 11.1–15.9)
MCH: 28.7 pg (ref 26.6–33.0)
MCHC: 32.4 g/dL (ref 31.5–35.7)
MCV: 88 fL (ref 79–97)
Platelets: 240 10*3/uL (ref 150–450)
RBC: 4.5 x10E6/uL (ref 3.77–5.28)
RDW: 13.3 % (ref 11.7–15.4)
WBC: 8 10*3/uL (ref 3.4–10.8)

## 2021-10-29 LAB — LIPID PANEL
Chol/HDL Ratio: 3.7 ratio (ref 0.0–4.4)
Cholesterol, Total: 205 mg/dL — ABNORMAL HIGH (ref 100–199)
HDL: 56 mg/dL (ref >39)
LDL Chol Calc (NIH): 118 mg/dL — ABNORMAL HIGH (ref 0–99)
Triglycerides: 178 mg/dL — ABNORMAL HIGH (ref 0–149)
VLDL Cholesterol Cal: 31 mg/dL (ref 5–40)

## 2021-10-29 LAB — HEMOGLOBIN A1C
Est. average glucose Bld gHb Est-mCnc: 146 mg/dL
Hgb A1c MFr Bld: 6.7 % — ABNORMAL HIGH (ref 4.8–5.6)

## 2021-10-29 MED ORDER — PRAVASTATIN SODIUM 20 MG PO TABS
ORAL_TABLET | ORAL | 1 refills | Status: DC
  Filled 2021-10-29: qty 48, fill #0
  Filled 2021-10-29: qty 16, 28d supply, fill #0
  Filled 2021-11-30: qty 16, 28d supply, fill #1
  Filled 2021-12-31: qty 16, 28d supply, fill #2
  Filled 2022-01-31: qty 16, 28d supply, fill #3

## 2021-10-29 MED ORDER — METFORMIN HCL 500 MG PO TABS
ORAL_TABLET | Freq: Two times a day (BID) | ORAL | 0 refills | Status: DC
  Filled 2021-10-29: qty 60, 30d supply, fill #0
  Filled 2021-11-30: qty 60, 30d supply, fill #1
  Filled 2021-12-31 (×2): qty 60, 30d supply, fill #2

## 2021-10-29 NOTE — Telephone Encounter (Signed)
Contacted pt to go over lab results pt didn't answer lvm   [employer] interperter: Ana ID: [phone]  Sent a CRM and forward labs to NT to give pt labs when they call back

## 2021-10-29 NOTE — Progress Notes (Signed)
Let patient know that her A1c is 6.7 meaning that her diabetes is controlled.  Kidney and liver function tests are normal.  Cholesterol level elevated but slightly improved from a year ago.  I recommend increasing Pravachol frequency from 3 days a week to 4 days a week.  This means she can take it Monday Wednesday Fridays and Saturdays.  Updated prescription will be sent to the pharmacy.

## 2021-10-29 NOTE — Telephone Encounter (Signed)
Requested Prescriptions  Pending Prescriptions Disp Refills   metFORMIN (GLUCOPHAGE) 500 MG tablet 180 tablet 0    Sig: TAKE 1 TABLET (500 MG TOTAL) BY MOUTH 2 (TWO) TIMES DAILY WITH A MEAL.     Endocrinology:  Diabetes - Biguanides Passed - 10/29/2021 11:47 AM      Passed - Cr in normal range and within 360 days    Creat  Date Value Ref Range Status  03/10/2015 0.65 0.50 - 1.10 mg/dL Final   Creatinine, Ser  Date Value Ref Range Status  10/28/2021 0.72 0.57 - 1.00 mg/dL Final         Passed - HBA1C is between 0 and 7.9 and within 180 days    HbA1c, POC (prediabetic range)  Date Value Ref Range Status  02/07/2020 6.3 5.7 - 6.4 % Final   HbA1c, POC (controlled diabetic range)  Date Value Ref Range Status  06/24/2021 6.0 0.0 - 7.0 % Final   Hgb A1c MFr Bld  Date Value Ref Range Status  10/28/2021 6.7 (H) 4.8 - 5.6 % Final    Comment:             Prediabetes: 5.7 - 6.4          Diabetes: >6.4          Glycemic control for adults with diabetes: <7.0          Passed - eGFR in normal range and within 360 days    GFR, Est African American  Date Value Ref Range Status  03/10/2015 >89 mL/min Final   GFR calc Af Amer  Date Value Ref Range Status  06/09/2020 107 >59 mL/min/1.73 Final    Comment:    **[facility] currently reports eGFR in compliance with the current**   recommendations of the [employer]. [facility] will   update reporting as new guidelines are published from the NKF-ASN   Task force.    GFR, Est Non African American  Date Value Ref Range Status  03/10/2015 >89 mL/min Final    Comment:      The estimated GFR is a calculation valid for adults (>=64 years old) that uses the CKD-EPI algorithm to adjust for age and sex. It is   not to be used for children, pregnant women, hospitalized patients,    patients on dialysis, or with rapidly changing kidney function. According to the NKDEP, eGFR >89 is normal, 60-89 shows mild impairment, 30-59 shows  moderate impairment, 15-29 shows severe impairment and <15 is ESRD.      GFR calc non Af Amer  Date Value Ref Range Status  06/09/2020 93 >59 mL/min/1.73 Final   eGFR  Date Value Ref Range Status  10/28/2021 101 >59 mL/min/1.73 Final         Passed - Valid encounter within last 6 months    Recent Outpatient Visits          4 days ago Diabetes mellitus type 2 in obese [facility])   [place] Karle Plumber B, MD   4 months ago Diabetes mellitus type 2 in obese [facility])   [place], MD   6 months ago Diabetes mellitus type 2 in obese [facility])   [place], Annie Main L, RPH-CPP   6 months ago Diabetes mellitus type 2 in obese [facility])   [place], Annie Main L, RPH-CPP   7 months ago Diabetes  mellitus type 2 in obese [facility])   [place] [facility] Ladell Pier, MD

## 2021-11-02 ENCOUNTER — Other Ambulatory Visit: Payer: Self-pay

## 2021-11-03 ENCOUNTER — Telehealth: Payer: No Typology Code available for payment source | Admitting: Family

## 2021-11-03 ENCOUNTER — Other Ambulatory Visit: Payer: Self-pay

## 2021-11-03 ENCOUNTER — Ambulatory Visit: Payer: Self-pay

## 2021-11-03 DIAGNOSIS — U071 COVID-19: Secondary | ICD-10-CM

## 2021-11-03 MED ORDER — MOLNUPIRAVIR EUA 200MG CAPSULE
4.0000 | ORAL_CAPSULE | Freq: Two times a day (BID) | ORAL | 0 refills | Status: AC
  Filled 2021-11-03: qty 40, 5d supply, fill #0

## 2021-11-03 NOTE — Telephone Encounter (Signed)
Using [employer] [phone].  Chief Complaint: COVID positive Symptoms: Sore throat, back pain Frequency: Onset symptoms Monday Pertinent Negatives: Patient denies fever, SOB, other symptoms Disposition: [] ED /[] Urgent Care (no appt availability in office) / [] Appointment(In office/virtual)/ [x]  [facility]/ [] Home Care/ [] Refused Recommended Disposition /[] [place] [facility]/ []  Follow-up with PCP Additional Notes: Home tested on yesterday, fever over the weekend after receiving shingles and Pneumonia vaccines in the office.    Summary: Covid Positive/sore throat.   Pt stated she tested positive for COVID on November 02, 2021. Pt has a sore throat pt has had a fever this past weekend but does not have one today.  Pt requesting medication.    No appointments are available.    Pt needs spanish interpreter.      Reason for Disposition  [1] HIGH RISK for severe COVID complications (e.g., weak immune system, age > 96 years, obesity with BMI > 25, pregnant, chronic lung disease or other chronic medical condition) AND [2] COVID symptoms (e.g., cough, fever)  (Exceptions: Already seen by PCP and no new or worsening symptoms.)  Answer Assessment - Initial Assessment Questions 1. COVID-19 DIAGNOSIS: "Who made your COVID-19 diagnosis?" "Was it confirmed by a positive lab test or self-test?" If not diagnosed by a doctor (or NP/PA), ask "Are there lots of cases (community spread) where you live?" Note: See public health department website, if unsure.     Tuesday tested using home test  2. COVID-19 EXPOSURE: "Was there any known exposure to COVID before the symptoms began?" CDC Definition of close contact: within 6 feet (2 meters) for a total of 15 minutes or more over a 24-hour period.      No 3. ONSET: "When did the COVID-19 symptoms start?"      On Monday 4. WORST SYMPTOM: "What is your worst symptom?" (e.g., cough, fever, shortness of breath, muscle aches)     Sore  throat 5. COUGH: "Do you have a cough?" If Yes, ask: "How bad is the cough?"       A little cough 6. FEVER: "Do you have a fever?" If Yes, ask: "What is your temperature, how was it measured, and when did it start?"     No 7. RESPIRATORY STATUS: "Describe your breathing?" (e.g., shortness of breath, wheezing, unable to speak)      No 8. HIGH RISK DISEASE: "Do you have any chronic medical problems?" (e.g., asthma, heart or lung disease, weak immune system, obesity, etc.)     Diabetes 9 OTHER SYMPTOMS: "Do you have any other symptoms?"  (e.g., chills, fatigue, headache, loss of smell or taste, muscle pain, sore throat)     Sore throat, back pain (little)  Protocols used: Coronavirus (COVID-19) Diagnosed or Suspected-A-AH

## 2021-11-03 NOTE — Progress Notes (Signed)
Virtual Visit  Note Due to COVID-19 pandemic this visit was conducted virtually. This visit type was conducted due to national recommendations for restrictions regarding the COVID-19 Pandemic (e.g. social distancing, sheltering in place) in an effort to limit this patient's exposure and mitigate transmission in our community. All issues noted in this document were discussed and addressed.  A physical exam was not performed with this format.  I connected with Linda Delgado on 11/03/21 at 12:32 pm by telephone and verified that I am speaking with the correct person using two identifiers. Linda Delgado is currently located at home and no one is currently with her during visit. The provider, Jannifer Rodney, FNP is located in their office at time of visit.  Interpreter used.   I discussed the limitations, risks, security and privacy concerns of performing an evaluation and management service by telephone and the availability of in person appointments. I also discussed with the patient that there may be a patient responsible charge related to this service. The patient expressed understanding and agreed to proceed.  Linda Delgado are scheduled for a virtual visit with your provider today.    Just as we do with appointments in the office, we must obtain your consent to participate.  Your consent will be active for this visit and any virtual visit you may have with one of our providers in the next 365 days.    If you have a MyChart account, I can also send a copy of this consent to you electronically.  All virtual visits are billed to your insurance company just like a traditional visit in the office.  As this is a virtual visit, video technology does not allow for your provider to perform a traditional examination.  This may limit your provider's ability to fully assess your condition.  If your provider identifies any concerns that need to be evaluated in person or the  need to arrange testing such as labs, EKG, etc, we will make arrangements to do so.    Although advances in technology are sophisticated, we cannot ensure that it will always work on either your end or our end.  If the connection with a video visit is poor, we may have to switch to a telephone visit.  With either a video or telephone visit, we are not always able to ensure that we have a secure connection.   I need to obtain your verbal consent now.   Are you willing to proceed with your visit today?   Linda Delgado has provided verbal consent on 11/03/2021 for a virtual visit (video or telephone).   Jannifer Rodney, [place] 11/03/2021  12:43 PM    History and Present Illness:  PT calls the office today with COVID. She reports her symptoms started Sunday  and tested yesterday and was positive.  Cough This is a new problem. The current episode started in the past 7 days. The problem has been unchanged. The problem occurs every few minutes. The cough is Productive of sputum. Associated symptoms include a fever, headaches, myalgias, nasal congestion and a sore throat. Pertinent negatives include no chills, ear congestion, ear pain, shortness of breath or wheezing. She has tried rest for the symptoms. The treatment provided mild relief.     Review of Systems  Constitutional:  Positive for fever. Negative for chills.  HENT:  Positive for sore throat. Negative for ear pain.   Respiratory:  Positive for cough. Negative for shortness of breath and wheezing.  Musculoskeletal:  Positive for myalgias.  Neurological:  Positive for headaches.    Observations/Objective: No SOB or distress noted  Assessment and Plan: 1. COVID-19 COVID positive, rest, force fluids, tylenol as needed, Quarantine for at least 5 days and you are fever free, then must wear a mask out in public from day 6-10, report any worsening symptoms such as increased shortness of breath, swelling, or continued high fevers.  Possible adverse effects discussed with antivirals.  - molnupiravir EUA (LAGEVRIO) 200 mg CAPS capsule; Take 4 capsules (800 mg total) by mouth 2 (two) times daily for 5 days.  Dispense: 40 capsule; Refill: 0     I discussed the assessment and treatment plan with the patient. The patient was provided an opportunity to ask questions and all were answered. The patient agreed with the plan and demonstrated an understanding of the instructions.   The patient was advised to call back or seek an in-person evaluation if the symptoms worsen or if the condition fails to improve as anticipated.  The above assessment and management plan was discussed with the patient. The patient verbalized understanding of and has agreed to the management plan. Patient is aware to call the clinic if symptoms persist or worsen. Patient is aware when to return to the clinic for a follow-up visit. Patient educated on when it is appropriate to go to the emergency department.   Time call ended:  12:47 pm  I provided 15 minutes of  non face-to-face time during this encounter.    Jannifer Rodney, FNP

## 2021-11-04 ENCOUNTER — Telehealth: Payer: Self-pay

## 2021-11-04 NOTE — Telephone Encounter (Signed)
I have reviewed RN [place]'s note.  Pt had Virtual visit with NP Jannifer Rodney yesterday afternoon and was prescribed antiviral for COVID positive infection.

## 2021-11-04 NOTE — Telephone Encounter (Signed)
Son interpreting for pt. Given lab results and instructions. Verbalizes understanding.

## 2021-11-30 ENCOUNTER — Other Ambulatory Visit: Payer: Self-pay

## 2021-12-01 ENCOUNTER — Other Ambulatory Visit: Payer: Self-pay

## 2021-12-31 ENCOUNTER — Other Ambulatory Visit: Payer: Self-pay

## 2022-01-10 ENCOUNTER — Other Ambulatory Visit: Payer: Self-pay

## 2022-01-11 ENCOUNTER — Other Ambulatory Visit: Payer: Self-pay

## 2022-01-31 ENCOUNTER — Other Ambulatory Visit: Payer: Self-pay | Admitting: Internal Medicine

## 2022-01-31 ENCOUNTER — Other Ambulatory Visit: Payer: Self-pay

## 2022-01-31 DIAGNOSIS — E1169 Type 2 diabetes mellitus with other specified complication: Secondary | ICD-10-CM

## 2022-01-31 MED ORDER — METFORMIN HCL 500 MG PO TABS
ORAL_TABLET | Freq: Two times a day (BID) | ORAL | 0 refills | Status: DC
  Filled 2022-01-31: qty 180, 90d supply, fill #0

## 2022-02-02 ENCOUNTER — Other Ambulatory Visit: Payer: Self-pay

## 2022-02-08 ENCOUNTER — Encounter: Payer: Self-pay | Admitting: Internal Medicine

## 2022-02-08 ENCOUNTER — Other Ambulatory Visit: Payer: Self-pay

## 2022-02-08 ENCOUNTER — Ambulatory Visit: Payer: Self-pay | Attending: Internal Medicine | Admitting: Internal Medicine

## 2022-02-08 ENCOUNTER — Other Ambulatory Visit: Payer: Self-pay | Admitting: Internal Medicine

## 2022-02-08 VITALS — BP 142/90 | HR 78 | Temp 98.0°F | Resp 16 | Wt 181.0 lb

## 2022-02-08 DIAGNOSIS — E669 Obesity, unspecified: Secondary | ICD-10-CM

## 2022-02-08 DIAGNOSIS — Z1231 Encounter for screening mammogram for malignant neoplasm of breast: Secondary | ICD-10-CM

## 2022-02-08 DIAGNOSIS — N924 Excessive bleeding in the premenopausal period: Secondary | ICD-10-CM

## 2022-02-08 DIAGNOSIS — G43009 Migraine without aura, not intractable, without status migrainosus: Secondary | ICD-10-CM | POA: Insufficient documentation

## 2022-02-08 DIAGNOSIS — Z6831 Body mass index (BMI) 31.0-31.9, adult: Secondary | ICD-10-CM

## 2022-02-08 DIAGNOSIS — Z23 Encounter for immunization: Secondary | ICD-10-CM

## 2022-02-08 DIAGNOSIS — E782 Mixed hyperlipidemia: Secondary | ICD-10-CM

## 2022-02-08 DIAGNOSIS — E1169 Type 2 diabetes mellitus with other specified complication: Secondary | ICD-10-CM

## 2022-02-08 DIAGNOSIS — R030 Elevated blood-pressure reading, without diagnosis of hypertension: Secondary | ICD-10-CM

## 2022-02-08 LAB — POCT GLYCOSYLATED HEMOGLOBIN (HGB A1C): HbA1c, POC (controlled diabetic range): 6.5 % (ref 0.0–7.0)

## 2022-02-08 LAB — GLUCOSE, POCT (MANUAL RESULT ENTRY): POC Glucose: 138 mg/dl — AB (ref 70–99)

## 2022-02-08 MED ORDER — PRAVASTATIN SODIUM 20 MG PO TABS
ORAL_TABLET | ORAL | 1 refills | Status: DC
  Filled 2022-02-08: qty 48, fill #0
  Filled 2022-03-04: qty 16, 28d supply, fill #0
  Filled 2022-04-08: qty 16, 28d supply, fill #1
  Filled 2022-05-06: qty 16, 28d supply, fill #2

## 2022-02-08 MED ORDER — METFORMIN HCL 500 MG PO TABS
ORAL_TABLET | Freq: Two times a day (BID) | ORAL | 2 refills | Status: DC
  Filled 2022-02-08: qty 180, fill #0
  Filled 2022-05-06: qty 180, 90d supply, fill #0
  Filled 2022-08-26: qty 180, 90d supply, fill #1
  Filled 2022-12-05: qty 60, 30d supply, fill #2

## 2022-02-08 MED ORDER — TOPIRAMATE 25 MG PO TABS
ORAL_TABLET | ORAL | 3 refills | Status: DC
  Filled 2022-02-08: qty 60, 30d supply, fill #0
  Filled 2022-03-11: qty 60, 30d supply, fill #1
  Filled 2022-04-15: qty 60, 30d supply, fill #2
  Filled 2022-08-08: qty 60, 30d supply, fill #3

## 2022-02-08 NOTE — Progress Notes (Signed)
? ? ?Patient ID: Linda Delgado, female    DOB: 01-23-1971  MRN: [id number] ? ?CC: chronic ds management ? ?Subjective: ?Linda Delgado is a 51 y.o. female who presents for chronic ds management ?Her concerns today include:  ?Pt with hx of NAFLD, obesity, DM, HL, anxiety.  ? ?C/o frequent headaches x 2 mth ?Frequency about 4 x a wk ?Comes on gradually and intensity increases over time ?Located at back of head close to neck most of the times but at times can be in both temples.  ?Duration:  about 2 hrs if she does not take anything for it but has to lay down and sleep.  Can occur twice in 1 day ?No previous hx of HA syndrome ?No associated N/V, blurred vision.  +Photophobia and phenophobia. No conjuctivial injection or lacrimation. Better laying down than up moving around. ?Takes Ibuprofen 200 mg 3 tabs when HA intense.  Relief in 10-15 mins ? ?Also c/o menstrual issue ?No menses x 6-8 mths. ?Then had menses in April that lasted 3 wks, moderate flow.  Reports fatigue Stopped 5 days ago ?Endorses hot flashes ? ?DM/obesity: ?Results for orders placed or performed in visit on 02/08/22  ?Glucose (CBG)  ?Result Value Ref Range  ? POC Glucose 138 (A) 70 - 99 mg/dl  ?HgB A1c  ?Result Value Ref Range  ? Hemoglobin A1C    ? HbA1c POC (<> result, manual entry)    ? HbA1c, POC (prediabetic range)    ? HbA1c, POC (controlled diabetic range) 6.5 0.0 - 7.0 %  ?Compliant with medications including metformin and Trulicity.  Does well with eating habits. ?Not exercising.  She attributes this to low energy.  Weight is up 6 pounds since September of last year. ?Compliant with taking pravastatin. ? ?HM: She is due for mammogram and second shingles vaccine. ?Patient Active Problem List  ? Diagnosis Date Noted  ? Generalized anxiety disorder 06/08/2020  ? Elevated blood pressure reading 02/07/2020  ? Mixed hyperlipidemia 02/07/2020  ? Hyperlipidemia associated with type 2 diabetes mellitus ([place]) 06/05/2019  ?  Controlled type 2 diabetes mellitus without complication, without long-term current use of insulin ([place]) 04/13/2018  ? Mastalgia in female 02/23/2017  ? NAFLD (nonalcoholic fatty liver disease) 02/23/2017  ? Class 1 obesity due to excess calories without serious comorbidity with body mass index (BMI) of 30.0 to 30.9 in adult 02/23/2017  ? Thyroid activity decreased 11/13/2014  ? Menorrhagia 04/02/2014  ?  ? ?Current Outpatient Medications on File Prior to Visit  ?Medication Sig Dispense Refill  ? Blood Glucose Monitoring Suppl (TRUE METRIX METER) w/Device KIT 1 each by Does not apply route 3 (three) times daily. 1 kit 0  ? Dulaglutide (TRULICITY) 8.29 FA/2.1HY SOPN Inject 0.75 mg into the skin once a week. 2 mL 6  ? metFORMIN (GLUCOPHAGE) 500 MG tablet TAKE 1 TABLET (500 MG TOTAL) BY MOUTH 2 (TWO) TIMES DAILY WITH A MEAL. 180 tablet 0  ? Multiple Vitamin (MULTIVITAMIN) tablet Take 1 tablet by mouth daily.    ? omega-3 acid ethyl esters (LOVAZA) 1 G capsule Take by mouth 2 (two) times daily.    ? pravastatin (PRAVACHOL) 20 MG tablet Take 1 tablet by mouth every Mon/Wed/Fri and Sat 48 tablet 1  ? ?No current facility-administered medications on file prior to visit.  ? ? ?Allergies  ?Allergen Reactions  ? Atorvastatin Other (See Comments)  ?  Muscle pain  ? ? ?Social History  ? ?Socioeconomic History  ? Marital status:  Married  ?  Spouse name: Not on file  ? Number of children: 3  ? Years of education: Not on file  ? Highest education level: Bachelor's degree (e.g., BA, AB, BS)  ?Occupational History  ? Not on file  ?Tobacco Use  ? Smoking status: Never  ? Smokeless tobacco: Never  ?Vaping Use  ? Vaping Use: Never used  ?Substance and Sexual Activity  ? Alcohol use: Yes  ?  Comment: rarely  ? Drug use: No  ? Sexual activity: Yes  ?  Partners: Male  ?  Birth control/protection: None  ?Other Topics Concern  ? Not on file  ?Social History Narrative  ? Patient doesn't like to talk on the phone  ? ?Social Determinants of  Health  ? ?Financial Resource Strain: Not on file  ?Food Insecurity: Not on file  ?Transportation Needs: Not on file  ?Physical Activity: Not on file  ?Stress: Not on file  ?Social Connections: Not on file  ?Intimate Partner Violence: Not on file  ? ? ?Family History  ?Problem Relation Age of Onset  ? Diabetes Father   ? Hypertension Father   ? Stroke Father   ? Hypertension Mother   ? Cancer Paternal Grandmother   ?     stomach  ? Breast cancer Paternal Aunt   ? ? ?Past Surgical History:  ?Procedure Laterality Date  ? [place], 2007  ? WISDOM TOOTH EXTRACTION    ? ? ?ROS: ?Review of Systems ?Negative except as stated above ? ?PHYSICAL EXAM: ?BP (!) 142/90 (BP Location: Left Arm, Patient Position: Sitting, Cuff Size: Normal)   Pulse 78   Temp 98 ?F (36.7 ?C) (Oral)   Resp 16   Wt 181 lb (82.1 kg)   LMP 02/08/2022   SpO2 98%   BMI 31.07 kg/m?   ?Wt Readings from Last 3 Encounters:  ?02/08/22 181 lb (82.1 kg)  ?06/24/21 175 lb 6.4 oz (79.6 kg)  ?03/23/21 182 lb 12.8 oz (82.9 kg)  ?Repeat blood pressure 149/87 ? ?Physical Exam ? ?General appearance - alert, well appearing, and in no distress ?Mental status - normal mood, behavior, speech, dress, motor activity, and thought processes ?Eyes -pink conjunctiva ?Chest - clear to auscultation, no wheezes, rales or rhonchi, symmetric air entry ?Heart - normal rate, regular rhythm, normal S1, S2, no murmurs, rubs, clicks or gallops ?Neurological - cranial nerves II through XII intact, normal muscle tone, no tremors, strength 5/5, Romberg sign negative, normal gait and station.  Gross sensation intact. ?Extremities - peripheral pulses normal, no pedal edema, no clubbing or cyanosis ? ? ? ?  Latest Ref Rng & Units 10/28/2021  ?  8:43 AM 06/24/2021  ?  2:52 PM 03/23/2021  ?  3:38 PM  ?CMP  ?Glucose 70 - 99 mg/dL 120      ?BUN 6 - 24 mg/dL 13      ?Creatinine 0.57 - 1.00 mg/dL 0.72      ?Sodium 134 - 144 mmol/L 141      ?Potassium 3.5 - 5.2 mmol/L 4.6       ?Chloride 96 - 106 mmol/L 103      ?CO2 20 - 29 mmol/L 23      ?Calcium 8.7 - 10.2 mg/dL 9.5      ?Total Protein 6.0 - 8.5 g/dL 6.9   7.5   7.5    ?Total Bilirubin 0.0 - 1.2 mg/dL 0.4   0.4   0.5    ?Alkaline Phos 44 -  121 IU/L 83   90   122    ?AST 0 - 40 IU/L 23   22   59    ?ALT 0 - 32 IU/L 32   28   99    ? ?Lipid Panel  ?   ?Component Value Date/Time  ? CHOL 205 (H) 10/28/2021 0843  ? TRIG 178 (H) 10/28/2021 0843  ? HDL 56 10/28/2021 0843  ? CHOLHDL 3.7 10/28/2021 0843  ? CHOLHDL 3.8 02/24/2014 0900  ? VLDL 33 02/24/2014 0900  ? LDLCALC 118 (H) 10/28/2021 0843  ? ? ?CBC ?   ?Component Value Date/Time  ? WBC 8.0 10/28/2021 0843  ? WBC 6.0 11/13/2014 1137  ? RBC 4.50 10/28/2021 0843  ? RBC 4.23 11/13/2014 1137  ? HGB 12.9 10/28/2021 0843  ? HCT 39.8 10/28/2021 0843  ? PLT 240 10/28/2021 0843  ? MCV 88 10/28/2021 0843  ? MCH 28.7 10/28/2021 0843  ? MCH 28.6 11/13/2014 1137  ? MCHC 32.4 10/28/2021 0843  ? MCHC 34.0 11/13/2014 1137  ? RDW 13.3 10/28/2021 0843  ? LYMPHSABS 2.5 11/13/2014 1137  ? MONOABS 0.4 11/13/2014 1137  ? EOSABS 0.2 11/13/2014 1137  ? BASOSABS 0.0 11/13/2014 1137  ? ? ?ASSESSMENT AND PLAN: ?1. Diabetes mellitus type 2 in obese [facility]) ?Controlled.  Continue Trulicity and metformin. ?Continue healthy eating habits.  Encouraged her to move as much as she can.  We will check CBC today to make sure she has not developed anemia from her recent prolonged menstrual bleeding. ?- Glucose (CBG) ?- HgB A1c ? ?2. Migraine without aura and without status migrainosus, not intractable ?Most likely migraine headaches.  New onset HTN is also in the differential.  ? She is using ibuprofen 600 mg for abortive therapy with good results.  On average she takes it twice a week.  I will have her continue with that for now.  Add Topamax as prophylaxis.  Follow-up in 6 weeks to see how she is doing. ?- topiramate (TOPAMAX) 25 MG tablet; Take 1 tablet by mouth nightly at bedtime for 1 week, then increase to 2 tablets  nightly.  Dispense: 60 tablet; Refill: 3 ? ?3. Abnormal perimenopausal bleeding ?This is perimenopausal bleeding that was prolonged.  We will check hormone levels and pelvic ultrasound. ?- FSH/LH ?- CBC ?- US Pelvic Comple

## 2022-02-08 NOTE — Progress Notes (Signed)
Pain in head, frequent headaches ? ?Menstrual cycle concerns. ?Lower abdominal strain  ?

## 2022-02-08 NOTE — Patient Instructions (Signed)
Perimenopausia ?Perimenopause ?La perimenopausia es el momento normal de la vida de una mujer en el que los niveles de estr?geno, la hormona femenina producida por los ovarios, comienzan a disminuir. Esto provoca cambios en los per?odos menstruales antes de que se detengan por completo (menopausia). La perimenopausia puede comenzar de 2 a 8 a?os antes de la menopausia. Durante la perimenopausia, los ovarios pueden o no producir ?vulos y Retail banker a?n puede quedar [place]. ??Cu?les son las causas? ?La perimenopausia es causada por un cambio natural en los niveles hormonales que se produce a medida que las mujeres envejecen. ??Qu? incrementa el riesgo? ?Es m?s probable que la perimenopausia comience a una edad m?s temprana si tiene ciertas afecciones o se ha realizado ciertos tratamientos, incluidos los siguientes: ?Un tumor en la hip?fisis del cerebro. ?Una enfermedad que afecte los ovarios y la producci?n de hormonas. ?Ciertos tratamientos para el c?ncer, como quimioterapia o terapia hormonal, o radioterapia en la pelvis. ?Fumar mucho y consumir alcohol de forma excesiva. ?Antecedentes familiares de menopausia temprana. ??Cu?les son los signos o s?ntomas? ?Los cambios por la perimenopausia afectan de [place] diferente a cada mujer. Los s?ntomas de esta afecci?n pueden incluir los siguientes: ?Acaloramiento. ?Per?odos menstruales irregulares. ?Sudoraci?n nocturna. ?Cambios en el sentimiento respecto de las [employer]. Es posible que el deseo sexual disminuya o que se sienta m?s inc?moda respecto de su sexualidad. ?Sequedad vaginal. ?Dolores de [place]. ?Cambios en el estado de ?nimo. ?Depresi?n. ?Problemas para dormir (insomnio). ?Problemas de memoria o dificultad para concentrarse. ?Irritabilidad. ?Cansancio. ?Aumento de [place]. ?Ansiedad. ?Problemas para quedar embarazada. ??C?mo se diagnostica? ?Esta afecci?n se diagnostica en funci?n de los antecedentes m?dicos, un examen f?sico, la edad, los antecedentes  menstruales y los s?ntomas. Tambi?n le podr?an realizar estudios hormonales. ??C?mo se trata? ?En algunos los casos, no se necesita tratamiento. Usted y el m?dico deben decidir juntos si se debe Arts administrator. El tratamiento se determinar? en funci?n de su cuadro cl?nico y de sus preferencias. Existen varios tratamientos disponibles, como: ?Terapia hormonal para la menopausia. ?Medicamentos para tratar s?ntomas espec?ficos. ?Acupuntura. ?Vitaminas o suplementos herbales. ?Antes de Biochemist, clinical, es importante que le avise al m?dico si tiene antecedentes personales o familiares de lo siguiente: ?Enfermedad card?aca. ?C?ncer de mama. ?Co?gulos de sangre. ?Diabetes. ?Osteoporosis. ?Siga estas instrucciones en su casa: ?Medicamentos ?Use los medicamentos de venta libre y los recetados solamente como se lo haya indicado el m?dico. ?Tome los suplementos vitam?nicos solamente como se lo haya indicado el m?dico. ?Hable con el m?dico antes de empezar a tomar cualquier suplemento herbal. ?Estilo de vida ? ?No consuma ning?n producto que contenga nicotina o tabaco, como cigarrillos, cigarrillos electr?nicos y tabaco de Higher education careers adviser. Si necesita ayuda para dejar de consumir estos productos, consulte al m?dico. ?Realice, por lo menos, 30 minutos de [place] f?sica 5 d?as por semana o m?s. ?Siga una dieta equilibrada que incluya frutas y verduras frescas, cereales integrales, soja, huevos, carnes magras y l?cteos descremados. ?Evite las bebidas con alcohol o cafe?na, as? como las comidas muy condimentadas. Esto podr?a ayudar a Librarian, academic. ?Intente dormir de 7 a 8 horas todas las noches. ?V?stase con capas de prendas que pueda quitarse para controlar el acaloramiento. ?Encuentre modos de controlar el estr?s, por ejemplo, a trav?s de la respiraci?n, meditaci?n o un diario ?ntimo. ?Indicaciones generales ? ?Lleve un registro de sus per?odos menstruales; incluya lo siguiente: ?El momento en que  ocurren. ?Qu? tan abundantes son y cu?nto duran. ?Cu?nto tiempo transcurre entre cada per?odo menstrual. ?Lleve un registro de  los sntomas; anote cundo comienzan, con qu frecuencia ocurren y cunto duran. Use lubricantes o humectantes vaginales para aliviar la sequedad vaginal y mejorar el bienestar durante las relaciones sexuales. An puede quedar embarazada si tiene periodos irregulares. Asegrese de utilizar anticonceptivos durante la perimenopausia si no desea quedar embarazada. Cumpla con todas las visitas de seguimiento. Esto es importante. Esto incluye la terapia grupal y la psicoterapia. Comunquese con un mdico si: Tiene una hemorragia vaginal abundante o despide cogulos de sangre. Su perodo menstrual dura ms de 2 das que lo habitual. Sus perodos menstruales se producen con una frecuencia menor que 21 das. Sangra despus de tener sexo. Siente dolor durante las relaciones sexuales. Solicite ayuda de inmediato si tiene: Dolor en el pecho, dificultad para respirar o dificultad para hablar. Depresin grave. Dolor al orinar. Dolor de cabeza intenso. Problemas de visin. Resumen La perimenopausia es el perodo en el cual el cuerpo de la mujer comienza a pasar a la menopausia. Esto puede suceder naturalmente o como consecuencia de otros problemas de salud o tratamientos mdicos. La perimenopausia puede comenzar entre 2 y 8 aos antes de la menopausia y suele durar varios aos. Los sntomas de la perimenopausia pueden controlarse con medicamentos, cambios en el estilo de vida y tratamientos complementarios, como la acupuntura. Esta informacin no tiene como fin reemplazar el consejo del mdico. Asegrese de hacerle al mdico cualquier pregunta que tenga. Document Revised: 04/28/2020 Document Reviewed: 04/28/2020 Elsevier Patient Education  2023 Elsevier Inc.  

## 2022-02-09 ENCOUNTER — Telehealth: Payer: Self-pay

## 2022-02-09 LAB — CBC
Hematocrit: 41.5 % (ref 34.0–46.6)
Hemoglobin: 14 g/dL (ref 11.1–15.9)
MCH: 29.1 pg (ref 26.6–33.0)
MCHC: 33.7 g/dL (ref 31.5–35.7)
MCV: 86 fL (ref 79–97)
Platelets: 273 10*3/uL (ref 150–450)
RBC: 4.81 x10E6/uL (ref 3.77–5.28)
RDW: 12.8 % (ref 11.7–15.4)
WBC: 6.9 10*3/uL (ref 3.4–10.8)

## 2022-02-09 LAB — FSH/LH
FSH: 21.2 m[IU]/mL
LH: 11.1 m[IU]/mL

## 2022-02-09 NOTE — Progress Notes (Signed)
Let patient know that her blood cell counts are normal meaning no anemia.  Hormone levels are not quite in the range for menopause.  Please keep upcoming appointment for pelvic ultrasound.

## 2022-02-09 NOTE — Telephone Encounter (Signed)
[employer] perla  Id# [id number]  contacted pt to go over lab results  pt is aware and doesn't have any questions or concerns  ?

## 2022-02-14 ENCOUNTER — Ambulatory Visit (HOSPITAL_COMMUNITY)
Admission: RE | Admit: 2022-02-14 | Discharge: 2022-02-14 | Disposition: A | Discharge status: Home / Self Care | Payer: Self-pay | Source: Ambulatory Visit | Attending: Internal Medicine | Admitting: Internal Medicine

## 2022-02-14 DIAGNOSIS — N924 Excessive bleeding in the premenopausal period: Secondary | ICD-10-CM | POA: Insufficient documentation

## 2022-02-16 ENCOUNTER — Telehealth: Payer: Self-pay

## 2022-02-16 NOTE — Telephone Encounter (Signed)
[employer] Linda Delgado Id# [id number]  contacted pt to go over ultrasound results pt is aware and doesn't have any questions or concerns  ?

## 2022-03-01 ENCOUNTER — Other Ambulatory Visit: Payer: Self-pay

## 2022-03-01 DIAGNOSIS — N6310 Unspecified lump in the right breast, unspecified quadrant: Secondary | ICD-10-CM

## 2022-03-04 ENCOUNTER — Other Ambulatory Visit: Payer: Self-pay

## 2022-03-11 ENCOUNTER — Other Ambulatory Visit: Payer: Self-pay

## 2022-03-11 ENCOUNTER — Encounter: Payer: Self-pay | Admitting: Pharmacist

## 2022-03-11 ENCOUNTER — Ambulatory Visit: Payer: Self-pay | Attending: Internal Medicine | Admitting: Pharmacist

## 2022-03-11 VITALS — BP 121/79

## 2022-03-11 DIAGNOSIS — R030 Elevated blood-pressure reading, without diagnosis of hypertension: Secondary | ICD-10-CM

## 2022-03-11 NOTE — Progress Notes (Signed)
S:     No chief complaint on file.  Linda Delgado is a 51 y.o. female who presents for hypertension evaluation, education, and management. PMH is significant for NAFLD, obesity, DM, HL, anxiety. Patient was referred and last seen by Primary Care Provider, Dr. Wynetta Emery, on 02/08/2022. At that visit, BP was 142/90. She has not been dx with HTN - Bps in clinic have been good in the past. She was asked to return today and initiate tx if BP met criteria for HTN.   Today, patient arrives in good spirits and presents without assistance. Denies dizziness, headache, blurred vision, swelling. Very motivated to change her lifestyle and has done so since last PCP visit in an effort to avoid taking BP medications. Of note, she does have a hx of GAD. She endorses stress related to a recent finding on her last mammogram. Has dx scans planned for 03/31/22.   Hypertension is not formerly diagnosed.   Family/Social history:  -FHX: DM, HTN, stroke -Tobacco: never smoker -Alcohol: none reported   Medication adherence: not current taking any BP medications.  Current antihypertensives include: none   Antihypertensives tried in the past include: none   Reported home BP readings: see below. Brought her log today.   Patient reported dietary habits:  -Reports heavy dietary modification since her last visit.  -Adherent with salt restriction -Denies drinking any caffeine  Patient-reported exercise habits: walks daily  O:  Vitals:   03/11/22 1403  BP: 121/79   Brings log from home today:  SBP range: 119-129. 139 DBP range: 76 - 88  Avg BP at home since 5/3: 125/83  Last 3 Office BP readings: BP Readings from Last 3 Encounters:  03/11/22 121/79  02/08/22 (!) 142/90  06/24/21 122/85    BMET    Component Value Date/Time   NA 141 10/28/2021 0843   K 4.6 10/28/2021 0843   CL 103 10/28/2021 0843   CO2 23 10/28/2021 0843   GLUCOSE 120 (H) 10/28/2021 0843   GLUCOSE 130 (H) 03/10/2015  1453   BUN 13 10/28/2021 0843   CREATININE 0.72 10/28/2021 0843   CREATININE 0.65 03/10/2015 1453   CALCIUM 9.5 10/28/2021 0843   GFRNONAA 93 06/09/2020 1039   GFRNONAA >89 03/10/2015 1453   GFRAA 107 06/09/2020 1039   GFRAA >89 03/10/2015 1453    Renal function: CrCl cannot be calculated (Patient's most recent lab result is older than the maximum 21 days allowed.).  Clinical ASCVD: No  The 10-year ASCVD risk score (Arnett DK, et al., 2019) is: 2.6%   Values used to calculate the score:     Age: 12 years     Sex: Female     Is Non-Hispanic African American: No     Diabetic: Yes     Tobacco smoker: No     Systolic Blood Pressure: 235 mmHg     Is BP treated: No     HDL Cholesterol: 56 mg/dL     Total Cholesterol: 205 mg/dL  A/P: Hypertension undiagnosed. BP currently normotensive. BP goal < 120/80 mmHg. Medication adherence: she is not currently taking any BP medications. Based on her home average and today's clinic value, will hold off on starting any HTN medications today.  - No medication changes  today.  -F/u labs ordered - none -Counseled on lifestyle modifications for blood pressure control including reduced dietary sodium, increased exercise, adequate sleep. -Encouraged patient to check BP at home and bring log of readings to next visit. Counseled on  proper use of home BP cuff.   Results reviewed and written information provided. Patient verbalized understanding of treatment plan. Total time in face-to-face counseling 30 minutes.   F/u clinic visit with PCP.   Benard Halsted, PharmD, [place], [place] [phone]

## 2022-03-22 ENCOUNTER — Ambulatory Visit: Payer: No Typology Code available for payment source | Admitting: Internal Medicine

## 2022-03-31 ENCOUNTER — Ambulatory Visit
Admission: RE | Admit: 2022-03-31 | Discharge: 2022-03-31 | Disposition: A | Discharge status: Home / Self Care | Payer: No Typology Code available for payment source | Source: Ambulatory Visit | Attending: Obstetrics and Gynecology | Admitting: Obstetrics and Gynecology

## 2022-03-31 ENCOUNTER — Other Ambulatory Visit: Payer: Self-pay | Admitting: Obstetrics and Gynecology

## 2022-03-31 ENCOUNTER — Ambulatory Visit: Payer: Self-pay | Admitting: *Deleted

## 2022-03-31 VITALS — BP 130/90 | Wt 178.1 lb

## 2022-03-31 DIAGNOSIS — Z1211 Encounter for screening for malignant neoplasm of colon: Secondary | ICD-10-CM

## 2022-03-31 DIAGNOSIS — R921 Mammographic calcification found on diagnostic imaging of breast: Secondary | ICD-10-CM

## 2022-03-31 DIAGNOSIS — N6310 Unspecified lump in the right breast, unspecified quadrant: Secondary | ICD-10-CM

## 2022-03-31 DIAGNOSIS — Z1239 Encounter for other screening for malignant neoplasm of breast: Secondary | ICD-10-CM

## 2022-03-31 NOTE — Patient Instructions (Signed)
Explained breast self awareness with Linda Delgado. Patient did not need a Pap smear today due to last Pap smear and HPV typing was 11/02/2017. Let her know [facility] will cover Pap smears and HPV typing every 5 years unless has a history of abnormal Pap smears. [facility]erred [facility] to [facility] for a diagnostic mammogram per recommendation. Appointment scheduled Thursday, March 31, 2022 at 1240. Patient aware of appointment and will be there. Linda Delgado verbalized understanding.  Sargent Mankey, Kathaleen Maser, RN 11:26 AM

## 2022-03-31 NOTE — Progress Notes (Signed)
Linda Delgado is a 51 y.o. female who presents to [facility] clinic today with complaint of of a right breast mass since 2020 that was being followed. Patients last diagnostic mammogram and right breat ultrasound was completed 12/12/2019 that recommended a bilateral diagnostic mammogram in one year that was not completed..    Pap Smear: Pap smear not completed today. Last Pap smear was 11/02/2017 at [facility] and normal with negative HPV. Per patient has no history of an abnormal Pap smear. Last Pap smear result is in Epic.   Physical exam: Breasts Breasts symmetrical. No skin abnormalities bilateral breasts. No nipple retraction bilateral breasts. No nipple discharge bilateral breasts. No lymphadenopathy. No lumps palpated bilateral breasts. No complaints of pain or tenderness on exam.  MS DIGITAL DIAG TOMO BILAT  Result Date: 12/12/2019 CLINICAL DATA:  51 year old patient presents follow-up of a probably benign mass right breast and annual examination. She has recent focal pain in the upper outer left breast. Spanish interpreter was present during the patient's visit. EXAM: DIGITAL DIAGNOSTIC BILATERAL MAMMOGRAM WITH CAD AND TOMO ULTRASOUND BILATERAL BREAST COMPARISON:  Previous exam(s). ACR Breast Density Category c: The breast tissue is heterogeneously dense, which may obscure small masses. FINDINGS: Metallic skin marker was placed in region of patient tenderness in the posterior third of the upper-outer quadrant of the left breast. Two 5 mm circumscribed oval masses are seen more anteriorly in the outer left breast. No suspicious microcalcification or distortion in the left breast. No suspicious mass, architectural distortion, or suspicious microcalcification in the right breast. Mammographic images were processed with CAD. Targeted ultrasound is performed: In the right breast at 2 o'clock position 3 cm from nipple is a circumscribed oval cystic mass with internal echogenic septations. Mass  measures 6 x 3 x 5 mm. Previously in February 2020 it was measured as 6 x 2 x 6 mm. Ultrasound of the region of patient tenderness in the upper-outer left breast at 2 o'clock position 7 cm from the nipple is negative. Normal fatty breast parenchyma is imaged. In the 1 o'clock position of the left breast 4 cm from the nipple is an oval simple cyst measuring 8 x 2 x 5 mm. At 2 o'clock position 5 cm from nipple is a 6 x 4 x 3 mm simple cyst. These account for the circumscribed masses seen on mammography. IMPRESSION: Stable probably benign mass in the right breast at 2 o'clock position 3 cm from the nipple. No suspicious findings on the right. No evidence of malignancy in the left breast. Normal tissue is imaged in the region of patient tenderness. Two simple cysts are seen more anteriorly in the upper-outer left breast. RECOMMENDATION: Bilateral diagnostic mammogram with right breast ultrasound is recommended in 1 year. This will complete a 2 year follow-up of the probably benign right breast mass at 2 o'clock position. I have discussed the findings and recommendations with the patient. If applicable, a reminder letter will be sent to the patient regarding the next appointment. BI-RADS CATEGORY  3: Probably benign. Electronically Signed   By: Britta Mccreedy M.D.   On: 12/12/2019 10:23   MS DIGITAL DIAG TOMO BILAT  Result Date: 11/22/2018 CLINICAL DATA:  51 year old female presenting for follow-up of a probably benign right breast mass. EXAM: DIGITAL DIAGNOSTIC BILATERAL MAMMOGRAM WITH CAD AND TOMO ULTRASOUND RIGHT BREAST COMPARISON:  Previous exam(s). ACR Breast Density Category c: The breast tissue is heterogeneously dense, which may obscure small masses. FINDINGS: No suspicious calcifications, masses or areas of  distortion are seen in the bilateral breasts. Mammographic images were processed with CAD. Ultrasound of the right breast at 2 o'clock, 3 cm from the nipple demonstrates a stable circumscribed hypoechoic  mass measuring 6 x 2 x 6 mm. IMPRESSION: 1.  The probably benign right breast mass at 2 o'clock is stable. 2.  No mammographic evidence of malignancy in the bilateral breasts. RECOMMENDATION: Bilateral diagnostic mammogram and right breast ultrasound in 12 months. I have discussed the findings and recommendations with the patient. Results were also provided in writing at the conclusion of the visit. If applicable, a reminder letter will be sent to the patient regarding the next appointment. BI-RADS CATEGORY  3: Probably benign. Electronically Signed   By: Frederico Hamman M.D.   On: 11/22/2018 09:48   MM DIAG BREAST TOMO UNI RIGHT  Result Date: 05/08/2018 CLINICAL DATA:  Six-month follow-up for a probably benign mass in the right breast. EXAM: DIGITAL DIAGNOSTIC RIGHT MAMMOGRAM WITH CAD AND TOMO ULTRASOUND RIGHT BREAST COMPARISON:  Previous exam(s). ACR Breast Density Category c: The breast tissue is heterogeneously dense, which may obscure small masses. FINDINGS: No suspicious calcifications, masses or areas of distortion are seen in the right breast. Mammographic images were processed with CAD. Ultrasound of the right breast at 2 o'clock, 3 cm from the nipple demonstrates a stable oval hypoechoic mass measuring 6 x 2 x 6 mm, previously measuring 7 x 3 x 6 mm. IMPRESSION: The probably benign right breast mass at 2 o'clock is stable. RECOMMENDATION: Six-month follow-up bilateral diagnostic mammogram and right breast ultrasound is recommended. I have discussed the findings and recommendations with the patient. Results were also provided in writing at the conclusion of the visit. If applicable, a reminder letter will be sent to the patient regarding the next appointment. BI-RADS CATEGORY  3: Probably benign. Electronically Signed   By: Frederico Hamman M.D.   On: 05/08/2018 11:22   MM DIAG BREAST TOMO BILATERAL  Result Date: 11/02/2017 CLINICAL DATA:  The patient presents with diffuse left breast pain. EXAM:  2D DIGITAL DIAGNOSTIC BILATERAL MAMMOGRAM WITH CAD AND ADJUNCT TOMO ULTRASOUND BILATERAL BREAST COMPARISON:  Previous exam(s). ACR Breast Density Category c: The breast tissue is heterogeneously dense, which may obscure small masses. FINDINGS: Multiple new obscured masses are seen on the left. Two new masses are seen in the medial right breast at approximately 2 o'clock and 3 o'clock. Mammographic images were processed with CAD. On physical exam, no suspicious lumps are identified. Targeted ultrasound is performed, showing multiple cysts in the left breast accounting for mammographic findings. There is a small cyst at 3 o'clock in the right breast likely accounting for the smaller of the 2 mammographically identified masses. There is a probable cluster of cysts at 2 o'clock, 3 cm from the nipple measuring 7 x 3 x 6 mm, likely accounting for the larger of the 2 masses seen mammographically at 2 o'clock. IMPRESSION: Probably benign mass in the right breast at 2 o'clock, 3 cm from the nipple, likely a cluster of cysts. RECOMMENDATION: Recommend six-month follow-up mammogram and ultrasound of the probable cluster of cysts in the right breast. I have discussed the findings and recommendations with the patient. Results were also provided in writing at the conclusion of the visit. If applicable, a reminder letter will be sent to the patient regarding the next appointment. BI-RADS CATEGORY  3: Probably benign. Electronically Signed   By: Gerome Sam III M.D   On: 11/02/2017 12:17  Pelvic/Bimanual Pap is not indicated today per BCCCP guidelines.    Smoking History: Patient has never smoked.   Patient Navigation: Patient education provided. Access to services provided for patient through [facility] program. Spanish interpreter Alene Mires from [facility] provided.   Colorectal Cancer Screening: Per patient has never had colonoscopy completed. Patient completed a FIT Test 03/30/2021 that was negative. FIT Test  given to patient today to complete. No complaints today.    Breast and Cervical Cancer Risk Assessment: Patient has a family history of a paternal aunt having breast cancer. Patient has no known genetic mutations or history of radiation treatment to the chest before age 50. Patient has no history of cervical dysplasia, immunocompromised, or DES exposure in-utero.  Risk Assessment     Risk Scores       03/31/2022 12/12/2019   Last edited by: Meryl Dare, CMA [place], Sherie Demetrius Charity, LPN   5-year risk: 0.7 % 0.7 %   Lifetime risk: 6.2 % 6.5 %            A: BCCCP exam without pap smear Complaint of right breast mass.  P: Referred patient to the [facility] for a diagnostic mammogram per recommendation. Appointment scheduled Thursday, March 31, 2022 at 1240.  Priscille Heidelberg, RN 03/31/2022 11:26 AM

## 2022-04-04 ENCOUNTER — Other Ambulatory Visit (HOSPITAL_COMMUNITY): Payer: Self-pay | Admitting: Diagnostic Radiology

## 2022-04-04 ENCOUNTER — Ambulatory Visit
Admission: RE | Admit: 2022-04-04 | Discharge: 2022-04-04 | Disposition: A | Discharge status: Home / Self Care | Payer: No Typology Code available for payment source | Source: Ambulatory Visit | Attending: Obstetrics and Gynecology | Admitting: Obstetrics and Gynecology

## 2022-04-04 DIAGNOSIS — R921 Mammographic calcification found on diagnostic imaging of breast: Secondary | ICD-10-CM

## 2022-04-08 ENCOUNTER — Other Ambulatory Visit: Payer: Self-pay

## 2022-04-15 ENCOUNTER — Other Ambulatory Visit: Payer: Self-pay

## 2022-05-06 ENCOUNTER — Other Ambulatory Visit: Payer: Self-pay

## 2022-05-19 ENCOUNTER — Ambulatory Visit: Payer: Self-pay | Attending: Internal Medicine | Admitting: Internal Medicine

## 2022-05-19 ENCOUNTER — Encounter: Payer: Self-pay | Admitting: Internal Medicine

## 2022-05-19 ENCOUNTER — Other Ambulatory Visit: Payer: Self-pay

## 2022-05-19 VITALS — BP 124/83 | HR 77 | Temp 98.2°F | Wt 182.0 lb

## 2022-05-19 DIAGNOSIS — Z1211 Encounter for screening for malignant neoplasm of colon: Secondary | ICD-10-CM

## 2022-05-19 DIAGNOSIS — E782 Mixed hyperlipidemia: Secondary | ICD-10-CM

## 2022-05-19 DIAGNOSIS — E1169 Type 2 diabetes mellitus with other specified complication: Secondary | ICD-10-CM

## 2022-05-19 DIAGNOSIS — M79602 Pain in left arm: Secondary | ICD-10-CM

## 2022-05-19 DIAGNOSIS — E669 Obesity, unspecified: Secondary | ICD-10-CM

## 2022-05-19 LAB — POCT GLYCOSYLATED HEMOGLOBIN (HGB A1C): HbA1c, POC (controlled diabetic range): 6.4 % (ref 0.0–7.0)

## 2022-05-19 LAB — GLUCOSE, POCT (MANUAL RESULT ENTRY): POC Glucose: 135 mg/dl — AB (ref 70–99)

## 2022-05-19 MED ORDER — PRAVASTATIN SODIUM 20 MG PO TABS
ORAL_TABLET | ORAL | 1 refills | Status: DC
  Filled 2022-05-19: qty 48, fill #0

## 2022-05-19 MED ORDER — CYCLOBENZAPRINE HCL 5 MG PO TABS
5.0000 mg | ORAL_TABLET | Freq: Every day | ORAL | 0 refills | Status: DC | PRN
  Filled 2022-05-19: qty 15, 15d supply, fill #0

## 2022-05-19 MED ORDER — NAPROXEN 500 MG PO TABS
500.0000 mg | ORAL_TABLET | Freq: Two times a day (BID) | ORAL | 0 refills | Status: DC | PRN
  Filled 2022-05-19: qty 30, 15d supply, fill #0

## 2022-05-19 MED ORDER — TRULICITY 0.75 MG/0.5ML ~~LOC~~ SOAJ
0.7500 mg | SUBCUTANEOUS | 6 refills | Status: DC
Start: 2022-05-19 — End: 2022-12-08
  Filled 2022-05-19 – 2022-06-06 (×2): qty 2, 28d supply, fill #0
  Filled 2022-07-04: qty 2, 28d supply, fill #1
  Filled 2022-08-08: qty 2, 28d supply, fill #2
  Filled 2022-09-02: qty 2, 28d supply, fill #3
  Filled 2022-10-06: qty 2, 28d supply, fill #4
  Filled 2022-11-08: qty 2, 28d supply, fill #5
  Filled 2022-12-05: qty 2, 28d supply, fill #6

## 2022-05-19 NOTE — Progress Notes (Signed)
Patient ID: Linda Delgado, female    DOB: 1971/09/07  MRN: [id number]  CC: Chronic disease management  Subjective: Linda Delgado is a 51 y.o. female who presents for chronic disease management. Her concerns today include:  Pt with hx of NAFLD, obesity, DM, HL, anxiety.   Pt c/o pain LT upper arm muscles x 5 weeks.  No initiating factors.  Pain is constant and worse with movement.  Better when she takes a warm shower.  She had stopped the Topamax for some time thinking that it was causing it but it has persisted.  She plans to restart the Topamax.  DM:   Results for orders placed or performed in visit on 05/19/22  POCT glucose (manual entry)  Result Value Ref Range   POC Glucose 135 (A) 70 - 99 mg/dl  POCT glycosylated hemoglobin (Hb A1C)  Result Value Ref Range   Hemoglobin A1C     HbA1c POC (<> result, manual entry)     HbA1c, POC (prediabetic range)     HbA1c, POC (controlled diabetic range) 6.4 0.0 - 7.0 %  A1C 6.4 still taking Trulicity 6.72 once  a week and Metformin 500 mg twice a day as prescribed Checks BS every 2 days.  Gives range 120-132.  Feels she is doing okay with her eating habits. Reports compliance with Pravachol.  Last LDL 118  Had bx on LT breast since last visit.  Findings were benign.  On last visit pelvic US ordered for one episode of prolong perimenopausal Bleeding.  [place] negative.  No menses for 3-4 mths after that then menses last wk for 5 days with mild to moderate bleeding.  HM: Given fit test on last visit.  She has not turned it in as yet but promises that she will do so. Patient Active Problem List   Diagnosis Date Noted   Migraine without aura and without status migrainosus, not intractable 02/08/2022   Generalized anxiety disorder 06/08/2020   Elevated blood pressure reading 02/07/2020   Mixed hyperlipidemia 02/07/2020   Hyperlipidemia associated with type 2 diabetes mellitus ([place]) 06/05/2019   Controlled type 2  diabetes mellitus without complication, without long-term current use of insulin ([place]) 04/13/2018   Mastalgia in female 02/23/2017   NAFLD (nonalcoholic fatty liver disease) 02/23/2017   Class 1 obesity due to excess calories without serious comorbidity with body mass index (BMI) of 30.0 to 30.9 in adult 02/23/2017   Thyroid activity decreased 11/13/2014   Menorrhagia 04/02/2014     Current Outpatient Medications on File Prior to Visit  Medication Sig Dispense Refill   Blood Glucose Monitoring Suppl (TRUE METRIX METER) w/Device KIT 1 each by Does not apply route 3 (three) times daily. 1 kit 0   metFORMIN (GLUCOPHAGE) 500 MG tablet TAKE 1 TABLET (500 MG TOTAL) BY MOUTH 2 (TWO) TIMES DAILY WITH A MEAL. 180 tablet 2   Multiple Vitamin (MULTIVITAMIN) tablet Take 1 tablet by mouth daily.     omega-3 acid ethyl esters (LOVAZA) 1 G capsule Take by mouth 2 (two) times daily.     topiramate (TOPAMAX) 25 MG tablet Take 1 tablet by mouth nightly at bedtime for 1 week, then increase to 2 tablets nightly. 60 tablet 3   No current facility-administered medications on file prior to visit.    Allergies  Allergen Reactions   Atorvastatin Other (See Comments)    Muscle pain    Social History   Socioeconomic History   Marital status: Married  Spouse name: Not on file   Number of children: 3   Years of education: Not on file   Highest education level: Bachelor's degree (e.g., BA, AB, BS)  Occupational History   Not on file  Tobacco Use   Smoking status: Never   Smokeless tobacco: Never  Vaping Use   Vaping Use: Never used  Substance and Sexual Activity   Alcohol use: Yes    Comment: rarely   Drug use: No   Sexual activity: Yes    Partners: Male    Birth control/protection: None  Other Topics Concern   Not on file  Social History Narrative   Patient doesn't like to talk on the phone   Social Determinants of Health   Financial Resource Strain: Not on file  Food Insecurity: No Food  Insecurity (03/31/2022)   Hunger Vital Sign    Worried About Running Out of Food in the Last Year: Never true    Ran Out of Food in the Last Year: Never true  Transportation Needs: No Transportation Needs (03/31/2022)   PRAPARE - Transportation    Lack of Transportation (Medical): No    Lack of Transportation (Non-Medical): No  Physical Activity: Sufficiently Active (11/02/2017)   Exercise Vital Sign    Days of Exercise per Week: 4 days    Minutes of Exercise per Session: 60 min  Stress: Stress Concern Present (11/02/2017)   [place]    Feeling of Stress : To some extent  Social Connections: Moderately Integrated (11/02/2017)   Social Connection and Isolation Panel [NHANES]    Frequency of Communication with Friends and Family: Never    Frequency of Social Gatherings with Friends and Family: Twice a week    Attends Religious Services: More than 4 times per year    Active Member of [employer] or Organizations: Yes    Attends Archivist Meetings: More than 4 times per year    Marital Status: Married  Human resources officer Violence: Not At Risk (11/02/2017)   Humiliation, Afraid, Rape, and Kick questionnaire    Fear of Current or Ex-Partner: No    Emotionally Abused: No    Physically Abused: No    Sexually Abused: No    Family History  Problem Relation Age of Onset   Diabetes Father    Hypertension Father    Stroke Father    Hypertension Mother    Cancer Paternal Grandmother        stomach   Breast cancer Paternal Aunt     Past Surgical History:  Procedure Laterality Date   [place], 2007   WISDOM TOOTH EXTRACTION      ROS: Review of Systems Negative except as stated above  PHYSICAL EXAM: BP 124/83   Pulse 77   Temp 98.2 F (36.8 C) (Oral)   Wt 182 lb (82.6 kg)   SpO2 97%   BMI 31.24 kg/m   Physical Exam  General appearance - alert, well appearing, and in no distress Mental  status - normal mood, behavior, speech, dress, motor activity, and thought processes Mouth - mucous membranes moist, pharynx normal without lesions Chest - clear to auscultation, no wheezes, rales or rhonchi, symmetric air entry Heart - normal rate, regular rhythm, normal S1, S2, no murmurs, rubs, clicks or gallops Musculoskeletal -left arm: No tenderness on palpation of the glenohumeral joint.  She has mild discomfort with passive range of motion of the shoulder joint but patient states that  most of the pain is in the upper arm muscles.  She points to the biceps and triceps.  Power 5/5 bilaterally proximally and distally in the upper extremities but she gives to pain a little on the left. Extremities - peripheral pulses normal, no pedal edema, no clubbing or cyanosis      Latest Ref Rng & Units 10/28/2021    8:43 AM 06/24/2021    2:52 PM 03/23/2021    3:38 PM  CMP  Glucose 70 - 99 mg/dL 120     BUN 6 - 24 mg/dL 13     Creatinine 0.57 - 1.00 mg/dL 0.72     Sodium 134 - 144 mmol/L 141     Potassium 3.5 - 5.2 mmol/L 4.6     Chloride 96 - 106 mmol/L 103     CO2 20 - 29 mmol/L 23     Calcium 8.7 - 10.2 mg/dL 9.5     Total Protein 6.0 - 8.5 g/dL 6.9  7.5  7.5   Total Bilirubin 0.0 - 1.2 mg/dL 0.4  0.4  0.5   Alkaline Phos 44 - 121 IU/L 83  90  122   AST 0 - 40 IU/L 23  22  59   ALT 0 - 32 IU/L 32  28  99    Lipid Panel     Component Value Date/Time   CHOL 205 (H) 10/28/2021 0843   TRIG 178 (H) 10/28/2021 0843   HDL 56 10/28/2021 0843   CHOLHDL 3.7 10/28/2021 0843   CHOLHDL 3.8 02/24/2014 0900   VLDL 33 02/24/2014 0900   LDLCALC 118 (H) 10/28/2021 0843    CBC    Component Value Date/Time   WBC 6.9 02/08/2022 1156   WBC 6.0 11/13/2014 1137   RBC 4.81 02/08/2022 1156   RBC 4.23 11/13/2014 1137   HGB 14.0 02/08/2022 1156   HCT 41.5 02/08/2022 1156   PLT 273 02/08/2022 1156   MCV 86 02/08/2022 1156   MCH 29.1 02/08/2022 1156   MCH 28.6 11/13/2014 1137   MCHC 33.7 02/08/2022  1156   MCHC 34.0 11/13/2014 1137   RDW 12.8 02/08/2022 1156   LYMPHSABS 2.5 11/13/2014 1137   MONOABS 0.4 11/13/2014 1137   EOSABS 0.2 11/13/2014 1137   BASOSABS 0.0 11/13/2014 1137    ASSESSMENT AND PLAN: 1. Diabetes mellitus type 2 in obese (HCC) At goal.  Continue metformin 500 mg twice a day and Trulicity 4.65 mg once a week.  Encouraged to continue healthy eating habits.  Encouraged to get in some form of moderate intensity exercise with goal of 150 minutes/week total. - POCT glucose (manual entry) - POCT glycosylated hemoglobin (Hb A1C) - Microalbumin / creatinine urine ratio - Dulaglutide (TRULICITY) 0.35 WS/5.6CL SOPN; Inject 0.75 mg into the skin once a week.  Dispense: 2 mL; Refill: 6  2. Mixed hyperlipidemia Continue Pravachol. - Lipid panel - pravastatin (PRAVACHOL) 20 MG tablet; Take 1 tablet by mouth every Mon/Wed/Fri and Sat  Dispense: 48 tablet; Refill: 1  3. Arm pain, left Most likely musculoskeletal in nature.  Will put her on a limited course of Naprosyn.  Advised to take with food.  We will also have her take Flexeril as needed.  Advised that the medication can cause drowsiness. - naproxen (NAPROSYN) 500 MG tablet; Take 1 tablet (500 mg total) by mouth 2 (two) times daily as needed (take with food).  Dispense: 30 tablet; Refill: 0 - cyclobenzaprine (FLEXERIL) 5 MG tablet; Take 1 tablet (5 mg  total) by mouth daily as needed for muscle spasms.  Dispense: 15 tablet; Refill: 0  4. Screening for colon cancer Encourage patient to use and turn in the fit test for colon cancer screening.    [employer] Language interpreter used during this encounter. #[id number], Rose  Patient was given the opportunity to ask questions.  Patient verbalized understanding of the plan and was able to repeat key elements of the plan.   This documentation was completed using Radio producer.  Any transcriptional errors are unintentional.  Orders Placed This Encounter  Procedures    Lipid panel   Microalbumin / creatinine urine ratio   POCT glucose (manual entry)   POCT glycosylated hemoglobin (Hb A1C)     Requested Prescriptions   Signed Prescriptions Disp Refills   naproxen (NAPROSYN) 500 MG tablet 30 tablet 0    Sig: Take 1 tablet (500 mg total) by mouth 2 (two) times daily as needed (take with food).   cyclobenzaprine (FLEXERIL) 5 MG tablet 15 tablet 0    Sig: Take 1 tablet (5 mg total) by mouth daily as needed for muscle spasms.   Dulaglutide (TRULICITY) 9.30 HS/3.7FJ SOPN 2 mL 6    Sig: Inject 0.75 mg into the skin once a week.   pravastatin (PRAVACHOL) 20 MG tablet 48 tablet 1    Sig: Take 1 tablet by mouth every Mon/Wed/Fri and Sat    No follow-ups on file.  Karle Plumber, MD, FACP

## 2022-05-19 NOTE — Progress Notes (Signed)
Pt wants to discuss Lt shoulder pain. Pt states pain been constant for 5wks.

## 2022-05-20 ENCOUNTER — Other Ambulatory Visit: Payer: Self-pay | Admitting: Internal Medicine

## 2022-05-20 DIAGNOSIS — E782 Mixed hyperlipidemia: Secondary | ICD-10-CM

## 2022-05-20 LAB — LIPID PANEL
Chol/HDL Ratio: 3.6 ratio (ref 0.0–4.4)
Cholesterol, Total: 206 mg/dL — ABNORMAL HIGH (ref 100–199)
HDL: 57 mg/dL (ref >39)
LDL Chol Calc (NIH): 115 mg/dL — ABNORMAL HIGH (ref 0–99)
Triglycerides: 199 mg/dL — ABNORMAL HIGH (ref 0–149)
VLDL Cholesterol Cal: 34 mg/dL (ref 5–40)

## 2022-05-20 LAB — MICROALBUMIN / CREATININE URINE RATIO
Creatinine, Urine: 45.5 mg/dL
Microalb/Creat Ratio: 8 mg/g creat (ref 0–29)
Microalbumin, Urine: 3.6 ug/mL

## 2022-05-20 MED ORDER — PRAVASTATIN SODIUM 20 MG PO TABS
ORAL_TABLET | ORAL | 1 refills | Status: DC
  Filled 2022-05-20: qty 60, fill #0
  Filled 2022-06-06: qty 30, 42d supply, fill #0
  Filled 2022-08-08: qty 30, 42d supply, fill #1
  Filled 2022-10-06: qty 30, 42d supply, fill #2
  Filled 2022-12-05: qty 30, 42d supply, fill #3

## 2022-05-23 ENCOUNTER — Other Ambulatory Visit: Payer: Self-pay

## 2022-06-06 ENCOUNTER — Other Ambulatory Visit: Payer: Self-pay

## 2022-07-01 ENCOUNTER — Ambulatory Visit
Admission: EM | Admit: 2022-07-01 | Discharge: 2022-07-01 | Disposition: A | Discharge status: Home / Self Care | Payer: No Typology Code available for payment source | Attending: Internal Medicine | Admitting: Internal Medicine

## 2022-07-01 ENCOUNTER — Encounter: Payer: Self-pay | Admitting: Emergency Medicine

## 2022-07-01 DIAGNOSIS — M79602 Pain in left arm: Secondary | ICD-10-CM

## 2022-07-01 MED ORDER — PREDNISONE 20 MG PO TABS: 40.0000 mg | ORAL_TABLET | Freq: Every day | ORAL | 0 refills | Status: AC

## 2022-07-01 NOTE — ED Triage Notes (Signed)
Pt is present today with left shoulder pain radiating down her left arm. Pt states she noticed the pain x2 months ago. Pt denies any numbness or tingling just a pulsating sensation.

## 2022-07-01 NOTE — Discharge Instructions (Signed)
I have prescribed you prednisone to decrease inflammation associated with your left arm pain.  Monitor blood sugar very closely as well.  Also recommend monitoring your blood pressure and following up with primary care if remains elevated.  Please avoid taking naproxen while taking prednisone.

## 2022-07-01 NOTE — ED Provider Notes (Signed)
[facility]    CSN: [id number] Arrival date & time: 07/01/22  1057      History   Chief Complaint Chief Complaint  Patient presents with   Shoulder Pain    HPI Linda Delgado is a 51 y.o. female.   Patient presents with left upper arm pain that has been present intermittently for the past 2 months.  Patient saw PCP on 05/19/2022 and was prescribed naproxen as well as Flexeril with minimal improvement of symptoms.  Denies any numbness or tingling.  Patient reports the pain mainly occurs with movement of the arm.  Denies any obvious injury.  Also has elevated blood pressure reading with retake being improved.  Denies chest pain, shortness of breath, nausea, vomiting, dizziness, blurred vision, headache.   Shoulder Pain   Past Medical History:  Diagnosis Date   Diabetes mellitus without complication ([place])    Menorrhagia    Thyroid disease     Patient Active Problem List   Diagnosis Date Noted   Migraine without aura and without status migrainosus, not intractable 02/08/2022   Generalized anxiety disorder 06/08/2020   Elevated blood pressure reading 02/07/2020   Mixed hyperlipidemia 02/07/2020   Hyperlipidemia associated with type 2 diabetes mellitus ([place]) 06/05/2019   Controlled type 2 diabetes mellitus without complication, without long-term current use of insulin ([place]) 04/13/2018   Mastalgia in female 02/23/2017   NAFLD (nonalcoholic fatty liver disease) 02/23/2017   Class 1 obesity due to excess calories without serious comorbidity with body mass index (BMI) of 30.0 to 30.9 in adult 02/23/2017   Thyroid activity decreased 11/13/2014   Menorrhagia 04/02/2014    Past Surgical History:  Procedure Laterality Date   [place], 2007   WISDOM TOOTH EXTRACTION      OB History     Gravida  3   Para  3   Term  3   Preterm  0   AB  0   Living  3      SAB  0   IAB  0   Ectopic  0   Multiple      Live Births                Home Medications    Prior to Admission medications   Medication Sig Start Date End Date Taking? Authorizing Provider  predniSONE (DELTASONE) 20 MG tablet Take 2 tablets (40 mg total) by mouth daily for 5 days. 07/01/22 07/06/22 Yes Dalonte Hardage, [place], FNP  Blood Glucose Monitoring Suppl (TRUE METRIX METER) w/Device KIT 1 each by Does not apply route 3 (three) times daily. 03/14/18   Argentina Donovan, PA-C  cyclobenzaprine (FLEXERIL) 5 MG tablet Take 1 tablet (5 mg total) by mouth daily as needed for muscle spasms. 05/19/22   Ladell Pier, MD  Dulaglutide (TRULICITY) 2.59 DG/3.8VF SOPN Inject 0.75 mg into the skin once a week. 05/19/22   Ladell Pier, MD  metFORMIN (GLUCOPHAGE) 500 MG tablet TAKE 1 TABLET (500 MG TOTAL) BY MOUTH 2 (TWO) TIMES DAILY WITH A MEAL. 02/08/22   Ladell Pier, MD  Multiple Vitamin (MULTIVITAMIN) tablet Take 1 tablet by mouth daily.    [provider]  naproxen (NAPROSYN) 500 MG tablet Take 1 tablet (500 mg total) by mouth 2 (two) times daily as needed (take with food). 05/19/22   Ladell Pier, MD  omega-3 acid ethyl esters (LOVAZA) 1 G capsule Take by mouth 2 (two) times daily.  [provider]  pravastatin (PRAVACHOL) 20 MG tablet Take 1 tablet by mouth every Mon/Wed/Fri, Sat and Sun 05/20/22   Ladell Pier, MD  topiramate (TOPAMAX) 25 MG tablet Take 1 tablet by mouth nightly at bedtime for 1 week, then increase to 2 tablets nightly. 02/08/22   Ladell Pier, MD    Family History Family History  Problem Relation Age of Onset   Diabetes Father    Hypertension Father    Stroke Father    Hypertension Mother    Cancer Paternal Grandmother        stomach   Breast cancer Paternal Aunt     Social History Social History   Tobacco Use   Smoking status: Never   Smokeless tobacco: Never  Vaping Use   Vaping Use: Never used  Substance Use Topics   Alcohol use: Yes    Comment: rarely   Drug use: No      Allergies   Atorvastatin   Review of Systems Review of Systems Per HPI  Physical Exam Triage Vital Signs ED Triage Vitals  Enc Vitals Group     BP 07/01/22 1153 (!) 169/93     Pulse Rate 07/01/22 1153 72     Resp 07/01/22 1153 18     Temp 07/01/22 1153 97.8 F (36.6 C)     Temp src --      SpO2 07/01/22 1153 98 %     Weight --      Height --      Head Circumference --      Peak Flow --      Pain Score 07/01/22 1152 7     Pain Loc --      Pain Edu? --      Excl. in [place]? --    No data found.  Updated Vital Signs BP (!) 152/94   Pulse 72   Temp 97.8 F (36.6 C)   Resp 18   SpO2 98%   Visual Acuity Right Eye Distance:   Left Eye Distance:   Bilateral Distance:    Right Eye Near:   Left Eye Near:    Bilateral Near:     Physical Exam Constitutional:      General: She is not in acute distress.    Appearance: Normal appearance. She is not toxic-appearing or diaphoretic.  HENT:     Head: Normocephalic and atraumatic.  Eyes:     Extraocular Movements: Extraocular movements intact.     Conjunctiva/sclera: Conjunctivae normal.     Pupils: Pupils are equal, round, and reactive to light.  Cardiovascular:     Rate and Rhythm: Normal rate and regular rhythm.     Pulses: Normal pulses.     Heart sounds: Normal heart sounds.  Pulmonary:     Effort: Pulmonary effort is normal. No respiratory distress.     Breath sounds: Normal breath sounds.  Musculoskeletal:     Comments: No tenderness to palpation.  Patient reports pain occurs in the left mid upper arm.  Patient has pain with abduction of arm at 45 degrees and extension and flexion of elbow.  No obvious swelling, discoloration, deformity, abrasions, lacerations noted.  Capillary refill and pulses normal.  Grip strength 5/5.  Neurological:     General: No focal deficit present.     Mental Status: She is alert and oriented to person, place, and time. Mental status is at baseline.     Cranial Nerves: Cranial  nerves 2-12 are intact.  Sensory: Sensation is intact.     Motor: Motor function is intact.     Coordination: Coordination is intact.     Gait: Gait is intact.  Psychiatric:        Mood and Affect: Mood normal.        Behavior: Behavior normal.        Thought Content: Thought content normal.        Judgment: Judgment normal.      UC Treatments / Results  Labs (all labs ordered are listed, but only abnormal results are displayed) Labs Reviewed - No data to display  EKG   Radiology No results found.  Procedures Procedures (including critical care time)  Medications Ordered in UC Medications - No data to display  Initial Impression / Assessment and Plan / UC Course  I have reviewed the triage vital signs and the nursing notes.  Pertinent labs & imaging results that were available during my care of the patient were reviewed by me and considered in my medical decision making (see chart for details).     Arm pain appears musculoskeletal in nature given that is reproducible with movement.  Do not think imaging is necessary given that it appears muscular in nature and there is no obvious bony tenderness or injury.  Given that symptoms have been refractory to NSAIDs and muscle relaxers, I do think patient would benefit from prednisone.  Will do low-dose and short course of prednisone.  Patient does report history of diabetes but last A1c was 6.7 so I do think this should be safe.  Patient does have a glucose monitor at home and advised patient to monitor glucose very closely while on prednisone and to discontinue if it becomes elevated.  Patient also advised of supportive care.  Advised patient to follow-up with orthopedist at provided contact information if symptoms persist or worsen.  Patient has elevated blood pressure reading mildly.  Suspect this could be due to pain.  Patient does not take any daily blood pressure medication.  No signs of hypertensive urgency or endorgan damage  on exam.  Neuro exam is also normal.  Patient has blood pressure cuff at home and advised to monitor and follow-up with PCP if remains elevated.  Discussed return ER precautions.  Patient verbalized understanding and was agreeable with plan. Final Clinical Impressions(s) / UC Diagnoses   Final diagnoses:  Left arm pain     Discharge Instructions      I have prescribed you prednisone to decrease inflammation associated with your left arm pain.  Monitor blood sugar very closely as well.  Also recommend monitoring your blood pressure and following up with primary care if remains elevated.  Please avoid taking naproxen while taking prednisone.    ED Prescriptions     Medication Sig Dispense Auth. Provider   predniSONE (DELTASONE) 20 MG tablet Take 2 tablets (40 mg total) by mouth daily for 5 days. 10 tablet Teodora Medici, [place]      PDMP not reviewed this encounter.   Teodora Medici, [place] 07/01/22 [phone]

## 2022-07-04 ENCOUNTER — Other Ambulatory Visit: Payer: Self-pay | Admitting: Internal Medicine

## 2022-07-04 ENCOUNTER — Other Ambulatory Visit: Payer: Self-pay

## 2022-07-04 DIAGNOSIS — E119 Type 2 diabetes mellitus without complications: Secondary | ICD-10-CM

## 2022-07-05 ENCOUNTER — Other Ambulatory Visit: Payer: Self-pay

## 2022-07-05 MED ORDER — TRUEPLUS LANCETS 28G MISC
6 refills | Status: DC
  Filled 2022-07-05: qty 100, 25d supply, fill #0

## 2022-07-05 MED ORDER — TRUE METRIX BLOOD GLUCOSE TEST VI STRP
ORAL_STRIP | 6 refills | Status: DC
  Filled 2022-07-05: qty 100, 25d supply, fill #0

## 2022-07-05 NOTE — Telephone Encounter (Signed)
Requested Prescriptions  Pending Prescriptions Disp Refills  . TRUEplus Lancets 28G MISC 100 each 11    Sig: USE AS DIRECTED     Endocrinology: Diabetes - Testing Supplies Passed - 07/04/2022  3:26 PM      Passed - Valid encounter within last 12 months    Recent Outpatient Visits          1 month ago Diabetes mellitus type 2 in obese [facility])   [place], Neoma Laming B, MD   3 months ago Elevated blood pressure reading in office without diagnosis of hypertension   [place], Annie Main L, RPH-CPP   4 months ago Diabetes mellitus type 2 in obese [facility])   [place] Karle Plumber B, MD   8 months ago Diabetes mellitus type 2 in obese [facility])   [place], Deborah B, MD   1 year ago Diabetes mellitus type 2 in obese [facility])   [place] Karle Plumber B, MD             . glucose blood (TRUE METRIX BLOOD GLUCOSE TEST) test strip 100 strip 11    Sig: USE AS INSTRUCTED     Endocrinology: Diabetes - Testing Supplies Passed - 07/04/2022  3:26 PM      Passed - Valid encounter within last 12 months    Recent Outpatient Visits          1 month ago Diabetes mellitus type 2 in obese [facility])   [place] Karle Plumber B, MD   3 months ago Elevated blood pressure reading in office without diagnosis of hypertension   [place], Annie Main L, RPH-CPP   4 months ago Diabetes mellitus type 2 in obese [facility])   [place] Karle Plumber B, MD   8 months ago Diabetes mellitus type 2 in obese [facility])   [facility] Ladell Pier, MD   1 year ago Diabetes mellitus type 2 in obese [facility])   [place] [facility] Ladell Pier, MD

## 2022-07-08 ENCOUNTER — Telehealth: Payer: Self-pay | Admitting: Physician Assistant

## 2022-07-08 ENCOUNTER — Encounter: Payer: Self-pay | Admitting: Physician Assistant

## 2022-07-08 ENCOUNTER — Ambulatory Visit (INDEPENDENT_AMBULATORY_CARE_PROVIDER_SITE_OTHER): Payer: Self-pay | Admitting: Physician Assistant

## 2022-07-08 ENCOUNTER — Other Ambulatory Visit: Payer: Self-pay

## 2022-07-08 ENCOUNTER — Ambulatory Visit (INDEPENDENT_AMBULATORY_CARE_PROVIDER_SITE_OTHER): Payer: Self-pay

## 2022-07-08 DIAGNOSIS — M7502 Adhesive capsulitis of left shoulder: Secondary | ICD-10-CM

## 2022-07-08 DIAGNOSIS — M25512 Pain in left shoulder: Secondary | ICD-10-CM

## 2022-07-08 MED ORDER — MELOXICAM 15 MG PO TABS
15.0000 mg | ORAL_TABLET | Freq: Every day | ORAL | 0 refills | Status: DC
  Filled 2022-07-08: qty 30, 30d supply, fill #0

## 2022-07-08 NOTE — Telephone Encounter (Signed)
Patient would like their prescriptions sent to the [facility] on [address]. Thank you

## 2022-07-08 NOTE — Progress Notes (Addendum)
Office Visit Note   Patient: Linda Delgado           Date of Birth: 1970-12-22           MRN: [id number] Visit Date: 07/08/2022              Requested by: Marcine Matar, MD [address] [place] [place],  [place] [address] PCP: Marcine Matar, MD   Assessment & Plan: Visit Diagnoses:  1. Acute pain of left shoulder   2. Adhesive capsulitis of left shoulder     Plan: Patient is a pleasant 51 year old woman who is accompanied with her son.  He is interpreting for her today.  She has a 60-month history of left shoulder pain.  She denies any specific injury but her son says that the family plays volleyball.  Her primary care has given her anti-inflammatories and muscle relaxants which she helped minimally.  She did receive a Medrol Dosepak at an urgent care and she completed that a couple days ago.  That did give her good relief but now the pain has returned and she has difficulty sleeping.  She is a fairly well-controlled diabetic with most recent A1c at 6.7.  Denies any previous history of shoulder difficulties.  Shoulder x-ray did not show any abnormalities.  No degenerative changes.  Clinically her exam is consistent with adhesive capsulitis.  She is very tight and painful in all directions including forward elevation internal rotation and external rotation.  She has good range of motion of her neck and this does not reproduce any symptoms going down her left arm.  I spoke about the natural history of this.  I am hesitant to give her an injection into her shoulder today because of her very recent completion of Medrol Dosepak.  I would like for her to come back in a week and I will go forward with a steroid injection into her left shoulder.  I will refer her to physical therapy and emphasized the importance of her doing this.  She understands that if we cannot make improvements with physical therapy she may need a manipulation under anesthesia.  We will give her an injection  of Toradol today to help with some of her discomfort for the next week.  I did offer her pain medication she declined this.  I will also write for prescription of meloxicam.  She understands to take this with food and not to take it for a couple days until after the Toradol has been completed  Follow-Up Instructions: Return in about 1 week (around 07/15/2022).   Orders:  Orders Placed This Encounter  Procedures   XR Shoulder Left   Ambulatory referral to Occupational Therapy   Meds ordered this encounter  Medications   meloxicam (MOBIC) 15 MG tablet    Sig: Take 1 tablet (15 mg total) by mouth daily.    Dispense:  30 tablet    Refill:  0      Procedures: No procedures performed   Clinical Data: No additional findings.   Subjective: Chief Complaint  Patient presents with   Left Shoulder - Pain    HPI pleasant 51 year old woman with a 34-month history of left shoulder pain.  Radiates down into her arm.  Also noted stiffness.  Denies any specific injury.  Has been treated with anti-inflammatories which did not help her much recently completed a Medrol Dosepak which helped but now her pain is returned  Review of Systems  All other  systems reviewed and are negative.    Objective: Vital Signs: There were no vitals taken for this visit.  Physical Exam Constitutional:      Appearance: Normal appearance.  Pulmonary:     Effort: Pulmonary effort is normal.  Skin:    General: Skin is warm and dry.  Neurological:     General: No focal deficit present.     Mental Status: She is alert.  Psychiatric:        Mood and Affect: Mood normal.     Ortho Exam Left shoulder: She has good range of motion of her neck with flexion extension turning side to side.  None of this reproduces pain down her left arm.  She has no paresthesias grip strength is intact.  She has forward elevation to about 100 degrees and it is painful.  She has difficulty internally rotating her arm behind her  back.  She is also very stiff with external rotation rotating only a few degrees.  Strength is intact though limited by range of motion and pain.  Distally she is neurologically intact Specialty Comments:  No specialty comments available.  Imaging: XR Shoulder Left  Result Date: 07/08/2022 Three-view radiographs of her left shoulder reviewed today.  She has well-maintained and congruent joint spacing between the humeral head and the glenoid fossa.  No hypertrophic degenerative changes at the [facility] joint.  No elevation of the humeral head.  No other osseous changes    PMFS History: Patient Active Problem List   Diagnosis Date Noted   Adhesive capsulitis of left shoulder 07/08/2022   Migraine without aura and without status migrainosus, not intractable 02/08/2022   Generalized anxiety disorder 06/08/2020   Elevated blood pressure reading 02/07/2020   Mixed hyperlipidemia 02/07/2020   Hyperlipidemia associated with type 2 diabetes mellitus ([place]) 06/05/2019   Controlled type 2 diabetes mellitus without complication, without long-term current use of insulin ([place]) 04/13/2018   Mastalgia in female 02/23/2017   NAFLD (nonalcoholic fatty liver disease) 02/23/2017   Class 1 obesity due to excess calories without serious comorbidity with body mass index (BMI) of 30.0 to 30.9 in adult 02/23/2017   Thyroid activity decreased 11/13/2014   Menorrhagia 04/02/2014   Past Medical History:  Diagnosis Date   Diabetes mellitus without complication ([place])    Menorrhagia    Thyroid disease     Family History  Problem Relation Age of Onset   Diabetes Father    Hypertension Father    Stroke Father    Hypertension Mother    Cancer Paternal Grandmother        stomach   Breast cancer Paternal Aunt     Past Surgical History:  Procedure Laterality Date   CESAREAN SECTION  1997, 1999, 2007   WISDOM TOOTH EXTRACTION     Social History   Occupational History   Not on file  Tobacco Use   Smoking  status: Never   Smokeless tobacco: Never  Vaping Use   Vaping Use: Never used  Substance and Sexual Activity   Alcohol use: Yes    Comment: rarely   Drug use: No   Sexual activity: Yes    Partners: Male    Birth control/protection: None

## 2022-07-08 NOTE — Telephone Encounter (Signed)
Linda Delgado had already confirmed the pharmacy with them and has sent it to them this morning. Thank you.

## 2022-07-15 ENCOUNTER — Encounter: Payer: Self-pay | Admitting: Physician Assistant

## 2022-07-15 ENCOUNTER — Ambulatory Visit (INDEPENDENT_AMBULATORY_CARE_PROVIDER_SITE_OTHER): Payer: Self-pay | Admitting: Physician Assistant

## 2022-07-15 DIAGNOSIS — M7502 Adhesive capsulitis of left shoulder: Secondary | ICD-10-CM

## 2022-07-15 MED ORDER — LIDOCAINE HCL 1 % IJ SOLN
2.0000 mL | INTRAMUSCULAR | Status: AC | PRN
  Administered 2022-07-15: 2 mL

## 2022-07-15 MED ORDER — METHYLPREDNISOLONE ACETATE 40 MG/ML IJ SUSP
80.0000 mg | INTRAMUSCULAR | Status: AC | PRN
  Administered 2022-07-15: 80 mg via INTRA_ARTICULAR

## 2022-07-15 MED ORDER — BUPIVACAINE HCL 0.25 % IJ SOLN
2.0000 mL | INTRAMUSCULAR | Status: AC | PRN
  Administered 2022-07-15: 2 mL via INTRA_ARTICULAR

## 2022-07-15 NOTE — Progress Notes (Signed)
Office Visit Note   Patient: Linda Delgado           Date of Birth: Feb 08, 1971           MRN: [id number] Visit Date: 07/15/2022              Requested by: Marcine Matar, MD [address] [place],  [place] [address] PCP: Marcine Matar, MD   Assessment & Plan: Visit Diagnoses:  1. Adhesive capsulitis of left shoulder     Plan: Patient is a pleasant 51 year old woman who is accompanied by an interpreter.  I saw her last week she has a history and findings consistent with adhesive capsulitis.  She has not yet started physical therapy but this has been approved and she will have her son call for an appointment.  I did want to give her a steroid injection if she wanted it and she does but last week she was still taking the Medrol Dosepak.  She does say she is a little bit better.  We will go forward with injection today follow-up in 4 weeks for reevaluation  Follow-Up Instructions: Return in about 4 weeks (around 08/12/2022).   Orders:  No orders of the defined types were placed in this encounter.  No orders of the defined types were placed in this encounter.     Procedures: Large Joint Inj: L subacromial bursa on 07/15/2022 10:19 AM Indications: diagnostic evaluation and pain Details: 25 G 1.5 in needle, posterior approach  Arthrogram: No  Medications: 2 mL lidocaine 1 %; 80 mg methylPREDNISolone acetate 40 MG/ML; 2 mL bupivacaine 0.25 % Outcome: tolerated well, no immediate complications Procedure, treatment alternatives, risks and benefits explained, specific risks discussed. Consent was given by the patient.     Clinical Data: No additional findings.   Subjective: Chief Complaint  Patient presents with   Left Shoulder - Follow-up    HPI patient is a pleasant 51 year old woman Spanish-speaking only accompanied by interpreter.  Last time I saw her was a week ago and diagnosed with left adhesive capsulitis.  She is going to begin physical  therapy.  She did want to have an injection but she was still taking the Medrol Dosepak.  She said she is a little better today but would still like the injection  Review of Systems  All other systems reviewed and are negative.    Objective: Vital Signs: There were no vitals taken for this visit.  Physical Exam Constitutional:      Appearance: Normal appearance.  Pulmonary:     Effort: Pulmonary effort is normal.  Neurological:     General: No focal deficit present.     Mental Status: She is alert.     Ortho Exam Left shoulder she has slightly improved motion.  But she has tightness with forward elevation and external rotation.  Distally she is neurovascularly intact Specialty Comments:  No specialty comments available.  Imaging: No results found.   PMFS History: Patient Active Problem List   Diagnosis Date Noted   Adhesive capsulitis of left shoulder 07/08/2022   Migraine without aura and without status migrainosus, not intractable 02/08/2022   Generalized anxiety disorder 06/08/2020   Elevated blood pressure reading 02/07/2020   Mixed hyperlipidemia 02/07/2020   Hyperlipidemia associated with type 2 diabetes mellitus ([facility]) 06/05/2019   Controlled type 2 diabetes mellitus without complication, without long-term current use of insulin ([facility]) 04/13/2018   Mastalgia in female 02/23/2017   NAFLD (nonalcoholic fatty liver disease)  02/23/2017   Class 1 obesity due to excess calories without serious comorbidity with body mass index (BMI) of 30.0 to 30.9 in adult 02/23/2017   Thyroid activity decreased 11/13/2014   Menorrhagia 04/02/2014   Past Medical History:  Diagnosis Date   Diabetes mellitus without complication ([place])    Menorrhagia    Thyroid disease     Family History  Problem Relation Age of Onset   Diabetes Father    Hypertension Father    Stroke Father    Hypertension Mother    Cancer Paternal Grandmother        stomach   Breast cancer Paternal Aunt      Past Surgical History:  Procedure Laterality Date   CESAREAN SECTION  1997, 1999, 2007   WISDOM TOOTH EXTRACTION     Social History   Occupational History   Not on file  Tobacco Use   Smoking status: Never   Smokeless tobacco: Never  Vaping Use   Vaping Use: Never used  Substance and Sexual Activity   Alcohol use: Yes    Comment: rarely   Drug use: No   Sexual activity: Yes    Partners: Male    Birth control/protection: None

## 2022-07-19 NOTE — Therapy (Incomplete)
OUTPATIENT OCCUPATIONAL THERAPY ORTHO EVALUATION  Patient Name: Linda Delgado MRN: [id number] DOB:02-May-1971, 51 y.o., female Today's Date: 07/20/2022  PCP: Marcine Matar, MD REFERRING PROVIDER: Persons, [place], [place]   OT End of Session - 07/20/22 1030     Visit Number 1    Number of Visits 12   1-2x/week for 8 weeks requested   Date for OT Re-Evaluation 08/17/22    Authorization Type None - self pay (will be applying for Medicaid)    OT Start Time 0930    OT Stop Time 1028    OT Time Calculation (min) 58 min    Activity Tolerance Patient tolerated treatment well    Behavior During Therapy [facility] for tasks assessed/performed             Past Medical History:  Diagnosis Date   Diabetes mellitus without complication (HCC)    Menorrhagia    Thyroid disease    Past Surgical History:  Procedure Laterality Date   CESAREAN SECTION  1997, 1999, 2007   WISDOM TOOTH EXTRACTION     Patient Active Problem List   Diagnosis Date Noted   Adhesive capsulitis of left shoulder 07/08/2022   Migraine without aura and without status migrainosus, not intractable 02/08/2022   Generalized anxiety disorder 06/08/2020   Elevated blood pressure reading 02/07/2020   Mixed hyperlipidemia 02/07/2020   Hyperlipidemia associated with type 2 diabetes mellitus (HCC) 06/05/2019   Controlled type 2 diabetes mellitus without complication, without long-term current use of insulin (HCC) 04/13/2018   Mastalgia in female 02/23/2017   NAFLD (nonalcoholic fatty liver disease) 40/07/2724   Class 1 obesity due to excess calories without serious comorbidity with body mass index (BMI) of 30.0 to 30.9 in adult 02/23/2017   Thyroid activity decreased 11/13/2014   Menorrhagia 04/02/2014    ONSET DATE: 4 months ago (June 2023)  REFERRING DIAG: Adhesive Capsulitis Left Shoulder (ICD-10-CM) M75.02  THERAPY DIAG:  Acute pain of left shoulder - Plan: Ot plan of care cert/re-cert  Muscle  weakness (generalized) - Plan: Ot plan of care cert/re-cert  Stiffness of left shoulder, not elsewhere classified - Plan: Ot plan of care cert/re-cert  Rationale for Evaluation and Treatment Rehabilitation  SUBJECTIVE:   SUBJECTIVE STATEMENT: Pt was referred to out pt occupational therapy today with a diagnosis of left shoulder adhesive capsulitis. She was referred by [place] Persons, PA from [facility] for OT Eval and treatment. Pt reports that in June, she began to have pain after waking up that has not stopped. The pain "does not go away" and she has limited movement in her left non-dominant shoulder and forearm per pt report.  Pt accompanied by: self and interpreter: [facility]  PERTINENT HISTORY:  HPI patient is a pleasant 51 year old woman Spanish-speaking only accompanied by interpreter. She was seen on 07/08/22 by [place] Persons, PA at [facility] and diagnosed with left adhesive capsulitis. She had a steroid injection on 07/15/22, she has taken a Medrol Dosepak to which she reported that she is a little better. She was referred to out-pt OT for treatment of adhesive capsulitis of left shoulder. Pt has a PMH to include: DM without complications, menorrhagia and thyroid disease.  PRECAUTIONS: None  WEIGHT BEARING RESTRICTIONS No  PAIN:  Are you having pain? Yes: NPRS scale: 3/10 at rest today and with activity 8/10 Pain location: Left Shoulder Pain description: Ache and sometimes sharp pain Aggravating factors: Activity, movement of her arm (trying to fix her hair is more  painful, fastening her bra) Relieving factors: There is a patch that helps, taking a shower and getting into the hot water helps me to relax with the pain. I also had an injection last week and that has helped me to be able to sleep.  FALLS: Has patient fallen in last 6 months? No  LIVING ENVIRONMENT: Lives with: lives with their family and lives with their spouse Lives in:  House/apartment Stairs: None, Kids sleep upstairs, pt sleeps downstairs Has following equipment at home: None  PLOF: Independent  PATIENT GOALS Decreased pain in left shoulder, be able to do her hair and daily activity, to be able to lift my arm and do what I could before.  OBJECTIVE:   HAND DOMINANCE: Right  ADLs: Overall ADLs: Difficulty fastening her bra and doing her hair. Family assists PRN but pt is doing most activity (Mod I level).  Transfers/ambulation related to ADLs: Independent Grooming: Difficulty doing her hair, raising her arm UB Dressing: Difficulty fastening her bra Bathing: Difficulty washing her hair  FUNCTIONAL OUTCOME MEASURES: Quick Dash: 50% impaired  UPPER EXTREMITY ROM     Active ROM Right eval Left eval  Shoulder flexion WNL 78  Shoulder abduction WNL 60  Shoulder adduction WNL impaired  Shoulder extension WNL 32  Shoulder internal rotation WNL Unable to actively or passively get into position to measure w/ goniometer  Shoulder external rotation WNL Unable (see above)  Elbow flexion  WNL  Elbow extension  Impaired/stiff, pain in extension noted  Wrist flexion    Wrist extension    Wrist ulnar deviation    Wrist radial deviation    Wrist pronation    Wrist supination    (Blank rows = not tested)  UPPER EXTREMITY MMT:     MMT Right eval Left eval  Shoulder flexion NT NT  Shoulder abduction    Shoulder adduction    Shoulder extension    Shoulder internal rotation    Shoulder external rotation    Middle trapezius    Lower trapezius    Elbow flexion    Elbow extension    Wrist flexion    Wrist extension    Wrist ulnar deviation    Wrist radial deviation    Wrist pronation    Wrist supination    (Blank rows = not tested)  HAND FUNCTION: WNL's : Grip  WNL's  COORDINATION: NT  SENSATION: Light touch: WFL  EDEMA: None noted or observed  COGNITION: Overall cognitive status: Within functional limits for tasks  assessed   OBSERVATIONS: Pt has a history and findings consistent with adhesive capsulitis. She presents today for out-pt occupational therapy and treatment.  Left shoulder: Pt reports slightly improved motion since her initial diagnosis but demonstrates tightness with forward elevation and external rotation. Distally she is neurovascularly intact Pt reports deep pain in shoulder joint capsule, as well as RTC,  + pain in deltoid & biceps with c/o pain that radiates from her shoulder to her biceps    Imaging: There are no imaging results in her chart  TODAY'S TREATMENT:  Pt was instructed in HEP for shoulder stretches (6-8x/day x10 reps each, hold for 10 seconds) to include shoulder ER (towel stretch), finger walk at wall and cane exercises for shoulder ABD, ADD and shoulder flexion. Handout was issued and reviewed with pt via interpreter in the clinic today. Pt was educated that frequent movement, functional use and stretching will assist with getting Active ROM back but non-use will cause increased stiffness and pain.  Pt was also educated verbally that she may want to warm up her shoulder prior to stretching using a warm shower or heating pad etc. She verbalized understanding of all of the above.  Pendulum Circular    Inclnese hacia adelante 90 usando una mesa como soporte. Balancee el cuerpo en forma circular para mover el brazo __20__ [employer] en sentido de las agujas del reloj y despus __20__ veces en sentido contrario. Haga __8__ sesiones por da.  ROM: Dispensing optician - with [employer] extremo inferior de la toalla con el brazo izquierdo y Doctor, general practice otro extremo de la toalla hacia arriba con el brazo opuesto. Sostenga _10___ segundos. Repita _10___ veces por rutina. Realice __6-8__ sesiones por da.  Cane Majorette - Sitting    Holding cane majorette style down across trunk, raise toward ceiling. Repeat _10__ times. Reverse hand placement, repeat to other side. Do  _6-8__ times per day.  SHOULDER: Flexion - Supine (Cane)    Hold cane in both hands. Raise arms up overhead. Do not allow back to arch. Hold _10_ seconds. Repeat 10 times. Do these 6-8 times per day    SHOULDER: Flexion At Wall    Slide both arms up wall while leaning gently into wall. Maintain upright posture and tuck in stomach. Hold _10__ seconds. _10 , _6-8 times per day.  Do the exercise above or the "finger walk" up the wall and hold for a stretch. Repeat 10 times, hold for 10 seconds, 6-8 times a day.   PATIENT EDUCATION: Education details: Pt was instructed in HEP for shoulder stretches (6-8x/day x10 reps each, hold for 10 seconds) to include shoulder ER (towel stretch), finger walk at wall and cane exercises for shoulder ABD, ADD and shoulder flexion. Handout was issued and reviewed with pt via interpreter in the clinic today. Pt was educated that frequent movement, functional use and stretching will assist with getting Active ROM back but non-use will cause increased stiffness and pain. Pt was also educated verbally that she may want to warm up her shoulder prior to stretching using a warm shower or heating pad etc. She verbalized understanding of all of the above. Person educated: Patient Education method: Explanation, Demonstration, Tactile cues, Verbal cues, and Handouts Education comprehension: verbalized understanding and needs further education   HOME EXERCISE PROGRAM: See above  GOALS: Goals reviewed with patient? Yes  SHORT TERM GOALS: Target date: 08/17/2022    Pt will be Mod I with initial HEP for A/ROM left shoulder.  Baseline: Requires verbal and tactile cues for positioning and follow through Goal status: Initial  2.  Pt will demo/state understanding of proper positioning of L UE for functional activity during ADL's, self care tasks and sleeping.  Baseline: Dependent Goal status: INITIAL   LONG TERM GOALS: Target Date: 09/14/22  Pt will improve  functional ability by decreased impairment per Quick DASH from 50% to 25%  or better, for better quality of life.  Baseline: 50% impairment  Goal status: INITIAL  2.  Pt will report improved active ROM and functional use of left UE at home for ADLs including washing and drying her hair and fastening her bra.  Baseline: Unable/significant difficulty Goal status: INITIAL  3.  Pt will improve A/ROM in left shoulder forward flexion from 78* to at least 120* or better, to have functional motion for tasks like reach and doing her hair.   Baseline: 78* shoulder flexion Goal status: INITIAL  4.  Pt will improve  pain free active ROM in her left shoulder from 8/10 pain to 3/10 or better, to have increased functional ability to carry out selfcare and higher-level homecare tasks   Baseline: 8/10 (78* forward flexion of shoulder with pain at baseline) Goal status: INITIAL  6.  Pt will decrease pain during functional activity to 3/10 or less for better sleep and occupational participation in daily roles.  Baseline: 8/10 Goal status: INITIAL   ASSESSMENT:  CLINICAL IMPRESSION: Patient is a 51 y.o.right HD female who was seen today for occupational therapy evaluation for left shoulder adhesive capsulitis. She has deficits in active ROM, decreased ability to perform daily activities and pain impacting occupational performance, ADL's and IADL's. She should benefit from out-pt OT for pt/family education, manual therapy, active and passive ROM, modalities and HEP instruction.   PERFORMANCE DEFICITS in functional skills including ADLs, IADLs, ROM, strength, pain, muscle spasms, flexibility, and UE functional use.  IMPAIRMENTS are limiting patient from ADLs, IADLs, rest and sleep, work, and leisure.   COMORBIDITIES may have co-morbidities  that affects occupational performance. Patient will benefit from skilled OT to address above impairments and improve overall function.  MODIFICATION OR ASSISTANCE TO  COMPLETE EVALUATION: Min-Moderate modification of tasks or assist with assess necessary to complete an evaluation.  OT OCCUPATIONAL PROFILE AND HISTORY: Detailed assessment: Review of records and additional review of physical, cognitive, psychosocial history related to current functional performance.  CLINICAL DECISION MAKING: Moderate - several treatment options, min-mod task modification necessary  REHAB POTENTIAL: Good  EVALUATION COMPLEXITY: Moderate      PLAN: OT FREQUENCY:  12 visits (1-2x/week for 8 weeks)  OT DURATION: 8 weeks  PLANNED INTERVENTIONS: self care/ADL training, therapeutic exercise, therapeutic activity, manual therapy, passive range of motion, ultrasound, moist heat, and patient/family education  RECOMMENDED OTHER SERVICES: N/A  CONSULTED AND AGREED WITH PLAN OF CARE: Patient and Interpreter  PLAN FOR NEXT SESSION: Mobilization left shoulder, cane & pulley exercises, pt/family education, review and upgrade HEP for left shoulder adhesive capsulitis.  Check all possible CPT codes: 97110- Therapeutic Exercise, 97530 - Therapeutic Activities, [phone] - Self Care, [phone] - Ultrasound, [phone] - Physical performance training, and Other Manual therapy     Check all conditions that are expected to impact treatment: Diabetes mellitus and Unknown   If treatment provided at initial evaluation, no treatment charged due to lack of authorization.    Everli Rother, [place], OT 07/20/2022, 11:27 AM

## 2022-07-20 ENCOUNTER — Ambulatory Visit: Payer: Self-pay | Attending: Physician Assistant | Admitting: Occupational Therapy

## 2022-07-20 ENCOUNTER — Encounter: Payer: Self-pay | Admitting: Occupational Therapy

## 2022-07-20 DIAGNOSIS — M6281 Muscle weakness (generalized): Secondary | ICD-10-CM | POA: Insufficient documentation

## 2022-07-20 DIAGNOSIS — M25512 Pain in left shoulder: Secondary | ICD-10-CM | POA: Insufficient documentation

## 2022-07-20 DIAGNOSIS — M25612 Stiffness of left shoulder, not elsewhere classified: Secondary | ICD-10-CM | POA: Insufficient documentation

## 2022-07-20 NOTE — Patient Instructions (Addendum)
Pendulum Circular    Inclnese hacia adelante 90 usando una mesa como soporte. Balancee el cuerpo en forma circular para mover el brazo __20__ [employer] en sentido de las agujas del [place] y despus __20__ veces en sentido contrario. Haga __8__ sesiones por da.  ROM: Teaching laboratory technician - with [employer] extremo inferior de la toalla con el brazo izquierdo y Naval architect otro extremo de la toalla hacia arriba con el brazo opuesto. Sostenga _10___ segundos. Repita _10___ veces por rutina. Realice __3-3__ sesiones por da.  Cane Majorette - Sitting    Holding cane majorette style down across trunk, raise toward ceiling. Repeat _10__ times. Reverse hand placement, repeat to other side. Do _6-8__ times per day.  SHOULDER: Flexion - Supine (Cane)    Hold cane in both hands. Raise arms up overhead. Do not allow back to arch. Hold _10_ seconds. Repeat 10 times. Do these 6-8 times per day    SHOULDER: Flexion At Wall    Slide both arms up wall while leaning gently into wall. Maintain upright posture and tuck in stomach. Hold _10__ seconds. _10 , _6-8 times per day.   Do the exercise above or the "finger walk" up the wall and hold for a stretch. Repeat 10 times, hold for 10 seconds, 6-8 times a day.

## 2022-07-27 ENCOUNTER — Ambulatory Visit: Payer: Self-pay | Admitting: Rehabilitative and Restorative Service Providers"

## 2022-08-02 NOTE — Therapy (Incomplete)
OUTPATIENT OCCUPATIONAL THERAPY TREATMENT NOTE  Patient Name: Linda Delgado MRN: [id number] DOB:06-May-1971, 51 y.o., female Today's Date: 08/02/2022  PCP: Linda Pier, MD REFERRING PROVIDER: Persons, Bevely Delgado, [place]     Past Medical History:  Diagnosis Date   Diabetes mellitus without complication [facility])    Menorrhagia    Thyroid disease    Past Surgical History:  Procedure Laterality Date   CESAREAN SECTION  1997, 1999, 2007   WISDOM TOOTH EXTRACTION     Patient Active Problem List   Diagnosis Date Noted   Adhesive capsulitis of left shoulder 07/08/2022   Migraine without aura and without status migrainosus, not intractable 02/08/2022   Generalized anxiety disorder 06/08/2020   Elevated blood pressure reading 02/07/2020   Mixed hyperlipidemia 02/07/2020   Hyperlipidemia associated with type 2 diabetes mellitus ([place]) 06/05/2019   Controlled type 2 diabetes mellitus without complication, without long-term current use of insulin ([place]) 04/13/2018   Mastalgia in female 02/23/2017   NAFLD (nonalcoholic fatty liver disease) 02/23/2017   Class 1 obesity due to excess calories without serious comorbidity with body mass index (BMI) of 30.0 to 30.9 in adult 02/23/2017   Thyroid activity decreased 11/13/2014   Menorrhagia 04/02/2014    ONSET DATE: 4 months ago (June 2023)  REFERRING DIAG: Adhesive Capsulitis Left Shoulder (ICD-10-CM) M75.02  THERAPY DIAG:  No diagnosis found.  Rationale for Evaluation and Treatment Rehabilitation  PERTINENT HISTORY:  HPI patient is a pleasant 51 year old woman Spanish-speaking only accompanied by interpreter. She was seen on 07/08/22 by Bevely Delgado Persons, PA at [facility] and diagnosed with left adhesive capsulitis. She had a steroid injection on 07/15/22, she has taken a Medrol Dosepak to which she reported that she is a little better. She was referred to out-pt OT for treatment of adhesive capsulitis of left shoulder. Pt has a  PMH to include: DM without complications, menorrhagia and thyroid disease.  Per Eval: "Pt reports that in June, she began to have pain after waking up that has not stopped. The pain "does not go away" and she has limited movement in her left non-dominant shoulder and forearm per pt report"  PRECAUTIONS: None  WEIGHT BEARING RESTRICTIONS No  SUBJECTIVE:   SUBJECTIVE STATEMENT: Pt arrives with interpretor, stating ***   Pt accompanied by: self and interpreter: Linda Delgado   PAIN:  Are you having pain? Yes: NPRS scale: 3/10 at rest today and with activity 8/10 Pain location: Left Shoulder Pain description: Ache and sometimes sharp pain Aggravating factors: Activity, movement of her arm (trying to fix her hair is more painful, fastening her bra) Relieving factors: There is a patch that helps, taking a shower and getting into the hot water helps me to relax with the pain. I also had an injection last week and that has helped me to be able to sleep.   PATIENT GOALS Decreased pain in left shoulder, be able to do her hair and daily activity, to be able to lift my arm and do what I could before.   OBJECTIVE: (All objective assessments below are from initial evaluation on: 07/20/22 unless otherwise specified.)    HAND DOMINANCE: Right  ADLs: Overall ADLs: Difficulty fastening her bra and doing her hair. Family assists PRN but pt is doing most activity (Mod I level).  Transfers/ambulation related to ADLs: Independent Grooming: Difficulty doing her hair, raising her arm UB Dressing: Difficulty fastening her bra Bathing: Difficulty washing her hair  FUNCTIONAL OUTCOME MEASURES: Quick Dash: 50% impaired  UPPER EXTREMITY  ROM     Active ROM Right eval Left eval  Shoulder flexion WNL 78  Shoulder abduction WNL 60  Shoulder adduction WNL impaired  Shoulder extension WNL 32  Shoulder internal rotation WNL Unable to actively or passively get into position to measure w/  goniometer  Shoulder external rotation WNL Unable (see above)  Elbow flexion  WNL  Elbow extension  Impaired/stiff, pain in extension noted  (Blank rows = not tested)  UPPER EXTREMITY MMT:     MMT Left TBD  Shoulder flexion   Shoulder abduction   Shoulder adduction   Shoulder extension   Shoulder internal rotation   Shoulder external rotation   Middle trapezius   Lower trapezius   Elbow flexion   Elbow extension   (Blank rows = not tested)  HAND FUNCTION: WNL's : Grip  WNL's  COORDINATION: 08/03/22: box and blocks test: *** blocks today (52 is WFL)   OBSERVATIONS: Pt has a history and findings consistent with adhesive capsulitis. She presents today for out-pt occupational therapy and treatment.  Left shoulder: Pt reports slightly improved motion since her initial diagnosis but demonstrates tightness with forward elevation and external rotation. Distally she is neurovascularly intact Pt reports deep pain in shoulder joint capsule, as well as RTC,  + pain in deltoid & biceps with c/o pain that radiates from her shoulder to her biceps   TODAY'S TREATMENT:  08/03/22:   Mobilization left shoulder, cane & pulley exercises, pt/family education, review and upgrade HEP for left shoulder adhesive capsulitis.   Eval: Pt was instructed in HEP for shoulder stretches (6-8x/day x10 reps each, hold for 10 seconds) to include shoulder ER (towel stretch), finger walk at wall and cane exercises for shoulder ABD, ADD and shoulder flexion. Handout was issued and reviewed with pt via interpreter in the clinic today. Pt was educated that frequent movement, functional use and stretching will assist with getting Active ROM back but non-use will cause increased stiffness and pain. Pt was also educated verbally that she may want to warm up her shoulder prior to stretching using a warm shower or heating pad etc. She verbalized understanding of all of the above.  Pendulum Circular    Inclnese hacia  adelante 90 usando una mesa como soporte. Balancee el cuerpo en forma circular para mover el brazo __20__ [employer] en sentido de las agujas del reloj y despus __20__ veces en sentido contrario. Haga __8__ sesiones por da.  ROM: Dispensing optician - with [employer] extremo inferior de la toalla con el brazo izquierdo y Doctor, general practice otro extremo de la toalla hacia arriba con el brazo opuesto. Sostenga _10___ segundos. Repita _10___ veces por rutina. Realice __6-8__ sesiones por da.  Cane Majorette - Sitting    Holding cane majorette style down across trunk, raise toward ceiling. Repeat _10__ times. Reverse hand placement, repeat to other side. Do _6-8__ times per day.  SHOULDER: Flexion - Supine (Cane)    Hold cane in both hands. Raise arms up overhead. Do not allow back to arch. Hold _10_ seconds. Repeat 10 times. Do these 6-8 times per day    SHOULDER: Flexion At Wall    Slide both arms up wall while leaning gently into wall. Maintain upright posture and tuck in stomach. Hold _10__ seconds. _10 , _6-8 times per day.  Do the exercise above or the "finger walk" up the wall and hold for a stretch. Repeat 10 times, hold for 10 seconds, 6-8 times a day.  PATIENT EDUCATION: Person educated: Patient Education method: Explanation, Demonstration, Actor cues, Verbal cues, and Handouts Education comprehension: verbalized understanding and needs further education   HOME EXERCISE PROGRAM: See tx section above for details   GOALS: Goals reviewed with patient? Yes  SHORT TERM GOALS: Target date: 08/30/2022    Pt will be Mod I with initial HEP for A/ROM left shoulder.  Baseline: Requires verbal and tactile cues for positioning and follow through Goal status: Initial  2.  Pt will demo/state understanding of proper positioning of L UE for functional activity during ADL's, self care tasks and sleeping.  Baseline: Dependent Goal status: INITIAL   LONG TERM  GOALS: Target Date: 09/14/22  Pt will improve functional ability by decreased impairment per Quick DASH from 50% to 25%  or better, for better quality of life.  Baseline: 50% impairment  Goal status: INITIAL  2.  Pt will report improved active ROM and functional use of left UE at home for ADLs including washing and drying her hair and fastening her bra.  Baseline: Unable/significant difficulty Goal status: INITIAL  3.  Pt will improve A/ROM in left shoulder forward flexion from 78* to at least 120* or better, to have functional motion for tasks like reach and doing her hair.   Baseline: 78* shoulder flexion Goal status: INITIAL  4.  Pt will improve pain free active ROM in her left shoulder from 8/10 pain to 3/10 or better, to have increased functional ability to carry out selfcare and higher-level homecare tasks   Baseline: 8/10 (78* forward flexion of shoulder with pain at baseline) Goal status: INITIAL  6.  Pt will decrease pain during functional activity to 3/10 or less for better sleep and occupational participation in daily roles.  Baseline: 8/10 Goal status: INITIAL   ASSESSMENT:  CLINICAL IMPRESSION: 08/03/22: ***  Eval: Patient is a 51 y.o.right HD female who was seen today for occupational therapy evaluation for left shoulder adhesive capsulitis. She has deficits in active ROM, decreased ability to perform daily activities and pain impacting occupational performance, ADL's and IADL's. She should benefit from out-pt OT for pt/family education, manual therapy, active and passive ROM, modalities and HEP instruction.    PLAN: OT FREQUENCY:  12 visits (1-2x/week for 8 weeks)  OT DURATION: 8 weeks  PLANNED INTERVENTIONS: self care/ADL training, therapeutic exercise, therapeutic activity, manual therapy, passive range of motion, ultrasound, moist heat, and patient/family education  RECOMMENDED OTHER SERVICES: N/A  CONSULTED AND AGREED WITH PLAN OF CARE: Patient and  Interpreter  PLAN FOR NEXT SESSION:  ***   Check all possible CPT codes: 13086- Therapeutic Exercise, 97530 - Therapeutic Activities, 97535 - Self Care, [id number] - Ultrasound, 97750 - Physical performance training, and Other Manual therapy     Check all conditions that are expected to impact treatment: Diabetes mellitus and Unknown   If treatment provided at initial evaluation, no treatment charged due to lack of authorization.    Fannie Knee, OTR/L, CHT 08/02/2022, 2:02 PM

## 2022-08-03 ENCOUNTER — Ambulatory Visit: Payer: Self-pay | Admitting: Rehabilitative and Restorative Service Providers"

## 2022-08-08 ENCOUNTER — Other Ambulatory Visit: Payer: Self-pay

## 2022-08-12 ENCOUNTER — Ambulatory Visit: Payer: No Typology Code available for payment source | Admitting: Physician Assistant

## 2022-08-26 ENCOUNTER — Other Ambulatory Visit: Payer: Self-pay | Admitting: Internal Medicine

## 2022-08-26 ENCOUNTER — Other Ambulatory Visit: Payer: Self-pay

## 2022-08-26 DIAGNOSIS — E669 Obesity, unspecified: Secondary | ICD-10-CM

## 2022-08-26 NOTE — Telephone Encounter (Signed)
Medication Refill - Medication: Rx #: [id number]  metFORMIN (GLUCOPHAGE) 500 MG tablet [[id number]]  Rx #: [id number]  Dulaglutide (TRULICITY) 0.75 MG/0.5ML SOPN [[id number]]   Has the patient contacted their pharmacy? Yes.   (Agent: If no, request that the patient contact the pharmacy for the refill. If patient does not wish to contact the pharmacy document the reason why and proceed with request.) (Agent: If yes, when and what did the pharmacy advise?)  Preferred Pharmacy (with phone number or street name):  [facility] [address], [address], [place] [address] Phone: [phone]  Fax: [phone]   Has the patient been seen for an appointment in the last year OR does the patient have an upcoming appointment? Yes.    Agent: Please be advised that RX refills may take up to 3 business days. We ask that you follow-up with your pharmacy.

## 2022-08-26 NOTE — Telephone Encounter (Signed)
Unable to refill per protocol, Rx request is too soon. Will refuse.  Requested Prescriptions  Pending Prescriptions Disp Refills   Dulaglutide (TRULICITY) 3.47 QQ/5.9DG SOPN 2 mL 6    Sig: Inject 0.75 mg into the skin once a week.     Endocrinology:  Diabetes - GLP-1 Receptor Agonists Passed - 08/26/2022  1:21 PM      Passed - HBA1C is between 0 and 7.9 and within 180 days    HbA1c, POC (prediabetic range)  Date Value Ref Range Status  02/07/2020 6.3 5.7 - 6.4 % Final   HbA1c, POC (controlled diabetic range)  Date Value Ref Range Status  05/19/2022 6.4 0.0 - 7.0 % Final         Passed - Valid encounter within last 6 months    Recent Outpatient Visits           3 months ago Diabetes mellitus type 2 in obese [facility])   [place], Neoma Laming B, MD   5 months ago Elevated blood pressure reading in office without diagnosis of hypertension   [place], Annie Main L, RPH-CPP   6 months ago Diabetes mellitus type 2 in obese [facility])   [place] Karle Plumber B, MD   10 months ago Diabetes mellitus type 2 in obese [facility])   [place] Ladell Pier, MD   1 year ago Diabetes mellitus type 2 in obese [facility])   [place] Karle Plumber B, MD               metFORMIN (GLUCOPHAGE) 500 MG tablet 180 tablet 2    Sig: TAKE 1 TABLET (500 MG TOTAL) BY MOUTH 2 (TWO) TIMES DAILY WITH A MEAL.     Endocrinology:  Diabetes - Biguanides Failed - 08/26/2022  1:21 PM      Failed - B12 Level in normal range and within 720 days    No results found for: "VITAMINB12"       Failed - CBC within normal limits and completed in the last 12 months    WBC  Date Value Ref Range Status  02/08/2022 6.9 3.4 - 10.8 x10E3/uL Final  11/13/2014 6.0 4.0 - 10.5 K/uL Final   RBC  Date Value Ref Range Status  02/08/2022 4.81 3.77 - 5.28  x10E6/uL Final  11/13/2014 4.23 3.87 - 5.11 MIL/uL Final   Hemoglobin  Date Value Ref Range Status  02/08/2022 14.0 11.1 - 15.9 g/dL Final   Hematocrit  Date Value Ref Range Status  02/08/2022 41.5 34.0 - 46.6 % Final   MCHC  Date Value Ref Range Status  02/08/2022 33.7 31.5 - 35.7 g/dL Final  11/13/2014 34.0 30.0 - 36.0 g/dL Final   [facility]  Date Value Ref Range Status  02/08/2022 29.1 26.6 - 33.0 pg Final  11/13/2014 28.6 26.0 - 34.0 pg Final   MCV  Date Value Ref Range Status  02/08/2022 86 79 - 97 fL Final   No results found for: "PLTCOUNTKUC", "LABPLAT", "POCPLA" RDW  Date Value Ref Range Status  02/08/2022 12.8 11.7 - 15.4 % Final         Passed - Cr in normal range and within 360 days    Creat  Date Value Ref Range Status  03/10/2015 0.65 0.50 - 1.10 mg/dL Final   Creatinine, Ser  Date Value Ref Range Status  10/28/2021 0.72 0.57 -  1.00 mg/dL Final         Passed - HBA1C is between 0 and 7.9 and within 180 days    HbA1c, POC (prediabetic range)  Date Value Ref Range Status  02/07/2020 6.3 5.7 - 6.4 % Final   HbA1c, POC (controlled diabetic range)  Date Value Ref Range Status  05/19/2022 6.4 0.0 - 7.0 % Final         Passed - eGFR in normal range and within 360 days    GFR, Est African American  Date Value Ref Range Status  03/10/2015 >89 mL/min Final   GFR calc Af Amer  Date Value Ref Range Status  06/09/2020 107 >59 mL/min/1.73 Final    Comment:    **[facility] currently reports eGFR in compliance with the current**   recommendations of the [employer]. [facility] will   update reporting as new guidelines are published from the NKF-ASN   Task force.    GFR, Est Non African American  Date Value Ref Range Status  03/10/2015 >89 mL/min Final    Comment:      The estimated GFR is a calculation valid for adults (>=88 years old) that uses the CKD-EPI algorithm to adjust for age and sex. It is   not to be used for children, pregnant  women, hospitalized patients,    patients on dialysis, or with rapidly changing kidney function. According to the NKDEP, eGFR >89 is normal, 60-89 shows mild impairment, 30-59 shows moderate impairment, 15-29 shows severe impairment and <15 is ESRD.      GFR calc non Af Amer  Date Value Ref Range Status  06/09/2020 93 >59 mL/min/1.73 Final   eGFR  Date Value Ref Range Status  10/28/2021 101 >59 mL/min/1.73 Final         Passed - Valid encounter within last 6 months    Recent Outpatient Visits           3 months ago Diabetes mellitus type 2 in obese [facility])   [place], Neoma Laming B, MD   5 months ago Elevated blood pressure reading in office without diagnosis of hypertension   [place], Annie Main L, RPH-CPP   6 months ago Diabetes mellitus type 2 in obese [facility])   [place] Karle Plumber B, MD   10 months ago Diabetes mellitus type 2 in obese [facility])   [place] [facility] Ladell Pier, MD   1 year ago Diabetes mellitus type 2 in obese [facility])   [place] [facility] Ladell Pier, MD

## 2022-08-31 ENCOUNTER — Other Ambulatory Visit: Payer: Self-pay | Admitting: Internal Medicine

## 2022-08-31 DIAGNOSIS — E1169 Type 2 diabetes mellitus with other specified complication: Secondary | ICD-10-CM

## 2022-08-31 NOTE — Telephone Encounter (Signed)
Medication Refill - Medication: Dulaglutide (TRULICITY) 0.75 MG/0.5ML SOPN   Has the patient contacted their pharmacy? No. (Agent: If no, request that the patient contact the pharmacy for the refill. If patient does not wish to contact the pharmacy document the reason why and proceed with request.) (Agent: If yes, when and what did the pharmacy advise?)  Preferred Pharmacy (with phone number or street name): [facility] - [facility] [address], [address], [place] [address] Phone: [phone]  Fax: [phone]  Has the patient been seen for an appointment in the last year OR does the patient have an upcoming appointment? Yes.    Agent: Please be advised that RX refills may take up to 3 business days. We ask that you follow-up with your pharmacy.

## 2022-08-31 NOTE — Telephone Encounter (Signed)
Too soon- last RF 05/19/22 2 ml 6 refills- should have enough until Feb  Requested Prescriptions  Refused Prescriptions Disp Refills   Dulaglutide (TRULICITY) 0.75 MG/0.5ML SOPN 2 mL 6    Sig: Inject 0.75 mg into the skin once a week.     Endocrinology:  Diabetes - GLP-1 Receptor Agonists Passed - 08/31/2022  2:04 PM      Passed - HBA1C is between 0 and 7.9 and within 180 days    HbA1c, POC (prediabetic range)  Date Value Ref Range Status  02/07/2020 6.3 5.7 - 6.4 % Final   HbA1c, POC (controlled diabetic range)  Date Value Ref Range Status  05/19/2022 6.4 0.0 - 7.0 % Final         Passed - Valid encounter within last 6 months    Recent Outpatient Visits           3 months ago Diabetes mellitus type 2 in obese [facility])   [place] [facility] [place], Gavin Pound B, MD   5 months ago Elevated blood pressure reading in office without diagnosis of hypertension   [facility] [place], [name], RPH-CPP   6 months ago Diabetes mellitus type 2 in obese [facility])   [place] [facility] Jonah Blue B, MD   10 months ago Diabetes mellitus type 2 in obese [facility])   [place] [facility] Marcine Matar, MD   1 year ago Diabetes mellitus type 2 in obese [facility])   [place] [facility] Marcine Matar, MD

## 2022-09-02 ENCOUNTER — Other Ambulatory Visit: Payer: Self-pay

## 2022-09-27 IMAGING — MG DIGITAL DIAGNOSTIC BILAT W/ TOMO W/ CAD
8 of 13 series · 8 of 33 positions shown · non-contrast
Comparison: Previous exam(s).

CLINICAL DATA: 51-year-old female presenting for delayed follow-up
of a probably benign right breast mass. Patient is also due for
annual exam of the left breast.

EXAM:
DIGITAL DIAGNOSTIC BILATERAL MAMMOGRAM WITH TOMOSYNTHESIS AND CAD;
ULTRASOUND RIGHT BREAST LIMITED
TECHNIQUE: Bilateral digital diagnostic mammography and breast tomosynthesis
was performed. The images were evaluated with computer-aided
detection.; Targeted ultrasound examination of the right breast was
performed

[L CC]
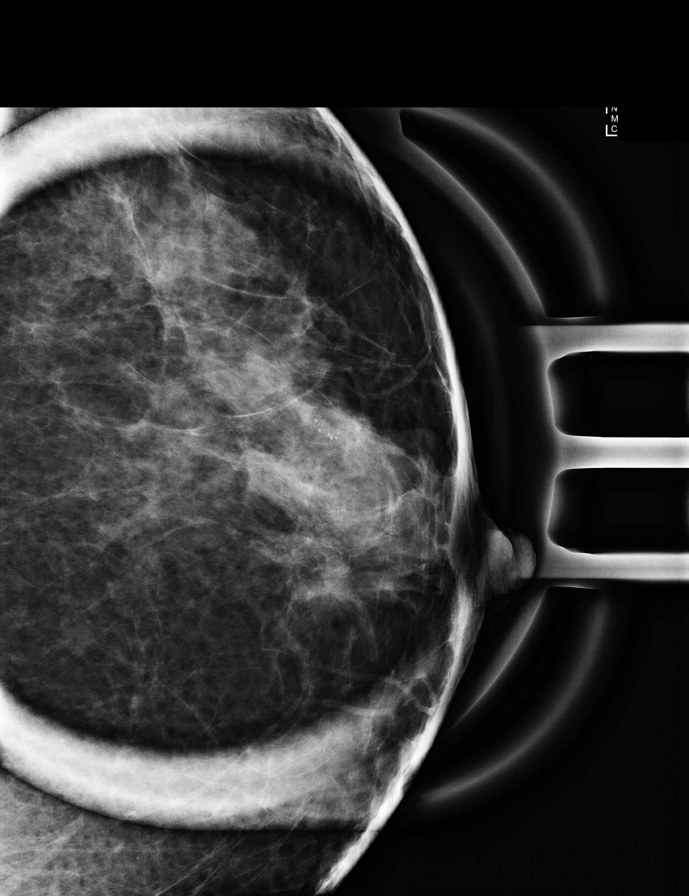

[L ML (1 of 2)]
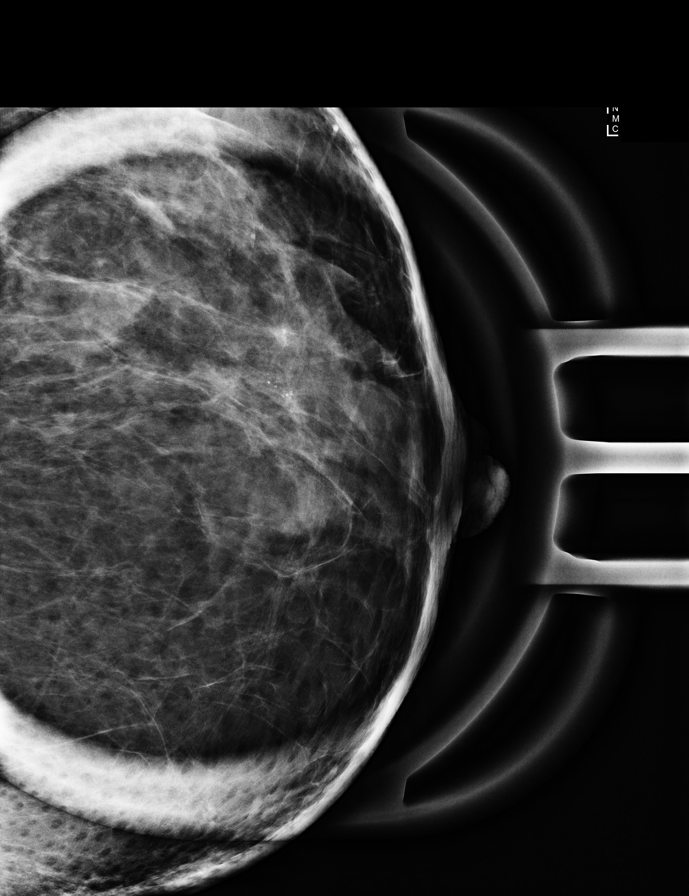

[L ML (2 of 2)]
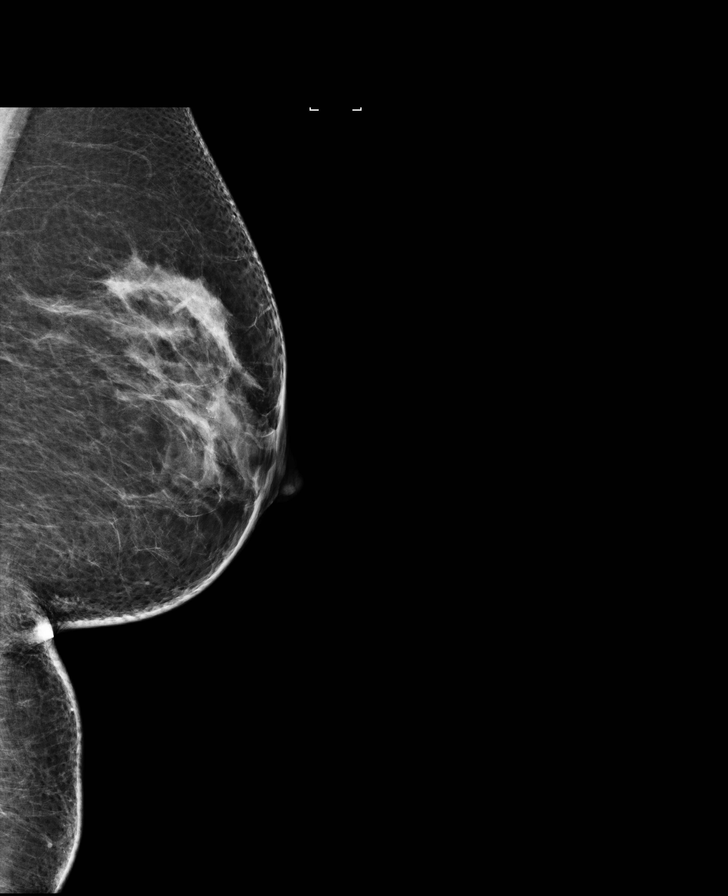

[L CC synth-2D]
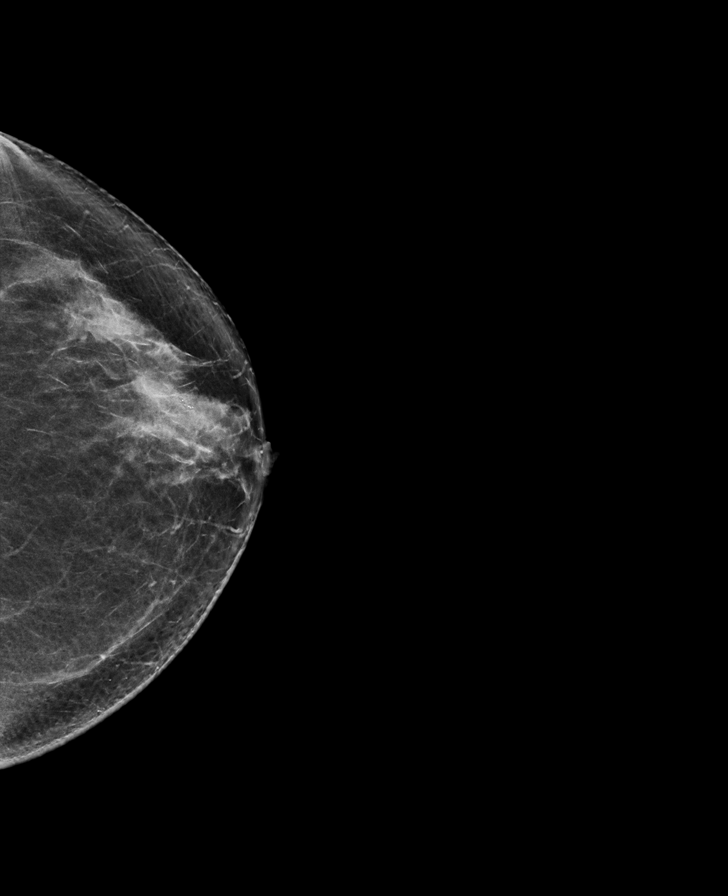

[R CC synth-2D]
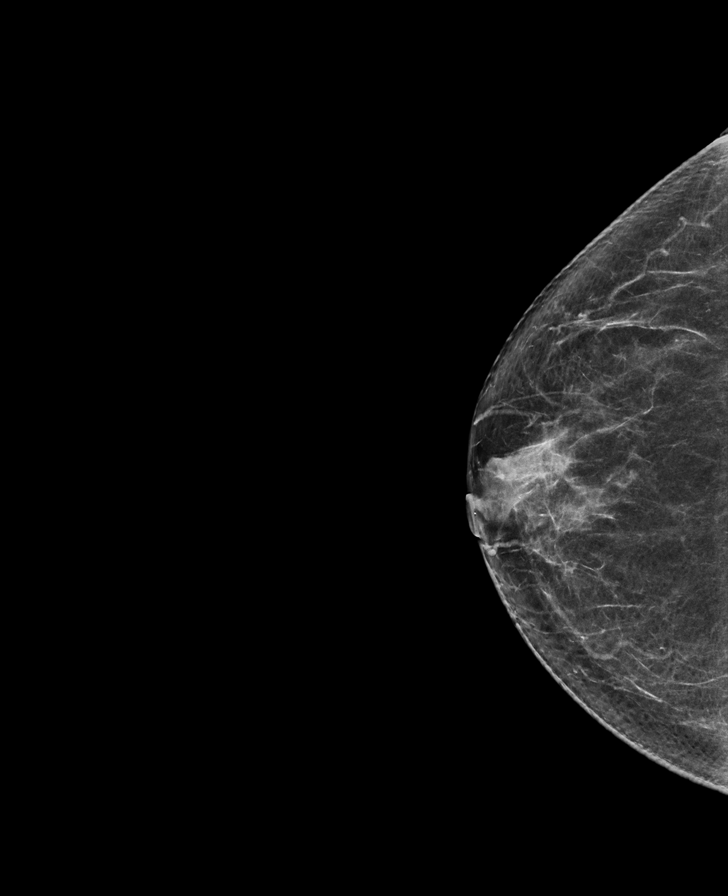

[L MLO synth-2D]
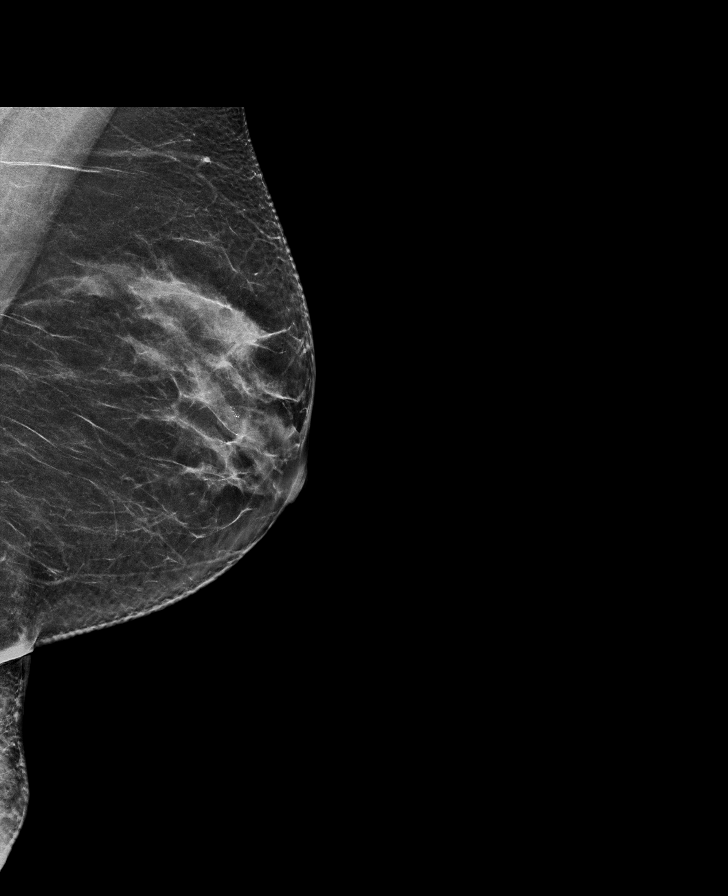

[L AT synth-2D]
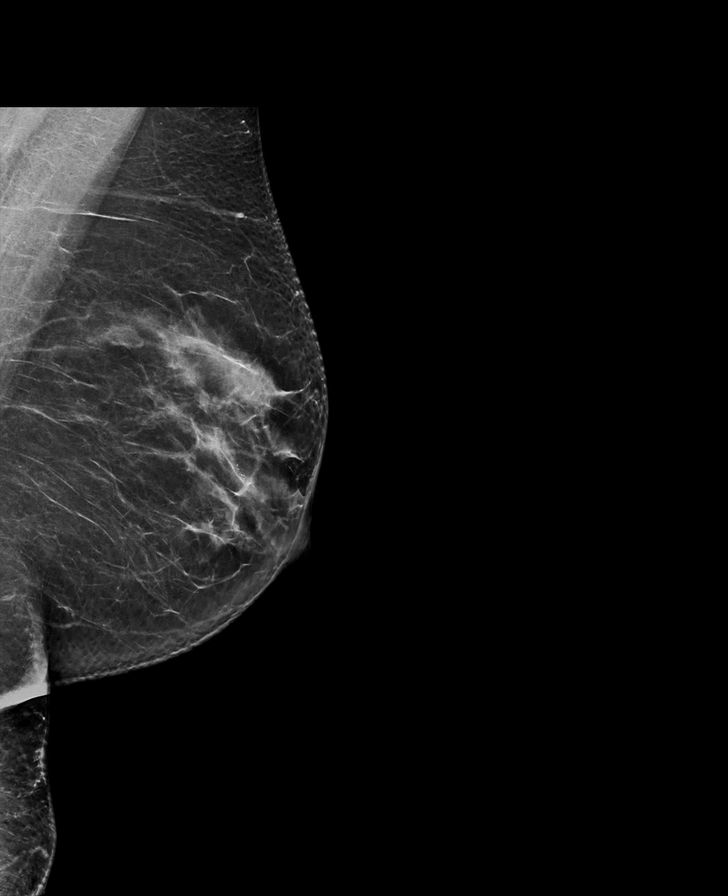

[R MLO synth-2D]
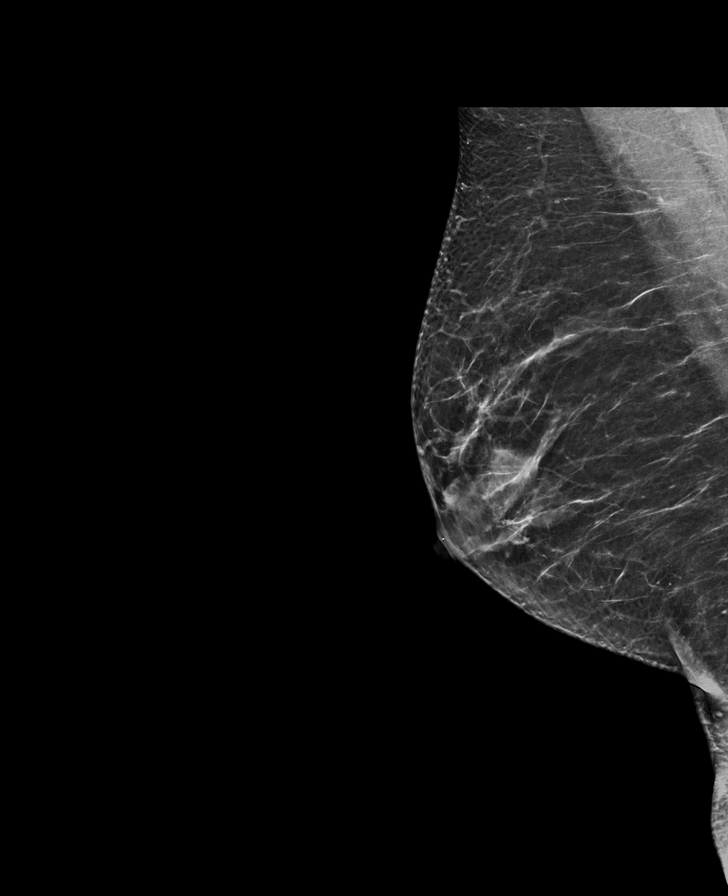

[8 of 33 positions shown; findings below may reference images not displayed]

ACR Breast Density Category c: The breast tissue is heterogeneously
dense, which may obscure small masses.
FINDINGS: Mammogram:

Right breast: No suspicious mass, distortion, or microcalcifications
are identified to suggest presence of malignancy.

Left breast: Spot 2D magnification views of the left breast were
performed in addition to standard views. There is persistence of a
small group of coarse heterogeneous calcifications in the upper
outer left breast anterior depth spanning 0.4 cm. No new findings
elsewhere in the left breast.

Ultrasound:

Targeted ultrasound is performed in the right breast at 2 o'clock 3
cm from the nipple demonstrating an oval circumscribed cystic mass
measuring 0.4 x 0.1 x 0.3 cm, previously measuring 0.6 x 0.3 x
cm.
IMPRESSION: 1. Indeterminate calcifications spanning 0.4 cm in the upper outer
left breast.

2. Interval decrease in size of the small cystic mass in the right
breast at 2 o'clock, considered benign.

RECOMMENDATION:
Stereotactic core needle biopsy x1 of the left breast.

I have discussed the findings and recommendations with the patient
who agrees to proceed with biopsy. The patient will be scheduled for
the biopsy appointment prior to leaving the office today.

BI-RADS CATEGORY  4: Suspicious.

## 2022-10-06 ENCOUNTER — Other Ambulatory Visit: Payer: Self-pay

## 2022-10-06 ENCOUNTER — Other Ambulatory Visit: Payer: Self-pay | Admitting: Internal Medicine

## 2022-10-06 DIAGNOSIS — G43009 Migraine without aura, not intractable, without status migrainosus: Secondary | ICD-10-CM

## 2022-10-06 MED ORDER — TOPIRAMATE 25 MG PO TABS
25.0000 mg | ORAL_TABLET | Freq: Every evening | ORAL | 0 refills | Status: DC
  Filled 2022-10-06: qty 30, 30d supply, fill #0
  Filled 2022-11-08: qty 30, 30d supply, fill #1

## 2022-10-07 ENCOUNTER — Other Ambulatory Visit: Payer: Self-pay

## 2022-11-08 ENCOUNTER — Other Ambulatory Visit: Payer: Self-pay

## 2022-12-05 ENCOUNTER — Other Ambulatory Visit: Payer: Self-pay

## 2022-12-05 ENCOUNTER — Other Ambulatory Visit: Payer: Self-pay | Admitting: Internal Medicine

## 2022-12-05 DIAGNOSIS — G43009 Migraine without aura, not intractable, without status migrainosus: Secondary | ICD-10-CM

## 2022-12-06 NOTE — Telephone Encounter (Signed)
Requested medication (s) are due for refill today - yes  Requested medication (s) are on the active medication list -yes  Future visit scheduled -no  Last refill: 10/06/22 #60  Notes to clinic: [place] lab protocol, last RF has notes- sent for review    Requested Prescriptions  Pending Prescriptions Disp Refills   topiramate (TOPAMAX) 25 MG tablet 60 tablet 0    Sig: Take 1 tablet (25 mg total) by mouth at bedtime.     Neurology: Anticonvulsants - topiramate & zonisamide Failed - 12/05/2022 11:53 AM      Failed - Cr in normal range and within 360 days    Creat  Date Value Ref Range Status  03/10/2015 0.65 0.50 - 1.10 mg/dL Final   Creatinine, Ser  Date Value Ref Range Status  10/28/2021 0.72 0.57 - 1.00 mg/dL Final         Failed - CO2 in normal range and within 360 days    CO2  Date Value Ref Range Status  10/28/2021 23 20 - 29 mmol/L Final         Failed - ALT in normal range and within 360 days    ALT  Date Value Ref Range Status  10/28/2021 32 0 - 32 IU/L Final         Failed - AST in normal range and within 360 days    AST  Date Value Ref Range Status  10/28/2021 23 0 - 40 IU/L Final         Passed - Completed PHQ-2 or PHQ-9 in the last 360 days      Passed - Valid encounter within last 12 months    Recent Outpatient Visits           6 months ago Diabetes mellitus type 2 in obese [facility])   [place] Karle Plumber B, MD   9 months ago Elevated blood pressure reading in office without diagnosis of hypertension   [place], [place] L, RPH-CPP   10 months ago Diabetes mellitus type 2 in obese [facility])   [place] Karle Plumber B, MD   1 year ago Diabetes mellitus type 2 in obese [facility])   [place] Karle Plumber B, MD   1 year ago Diabetes mellitus type 2 in obese [facility])   [place] Ladell Pier, MD                 Requested Prescriptions  Pending Prescriptions Disp Refills   topiramate (TOPAMAX) 25 MG tablet 60 tablet 0    Sig: Take 1 tablet (25 mg total) by mouth at bedtime.     Neurology: Anticonvulsants - topiramate & zonisamide Failed - 12/05/2022 11:53 AM      Failed - Cr in normal range and within 360 days    Creat  Date Value Ref Range Status  03/10/2015 0.65 0.50 - 1.10 mg/dL Final   Creatinine, Ser  Date Value Ref Range Status  10/28/2021 0.72 0.57 - 1.00 mg/dL Final         Failed - CO2 in normal range and within 360 days    CO2  Date Value Ref Range Status  10/28/2021 23 20 - 29 mmol/L Final         Failed - ALT in normal range and within 360 days    ALT  Date Value Ref Range Status  10/28/2021 32 0 - 32 IU/L Final         Failed - AST in normal range and within 360 days    AST  Date Value Ref Range Status  10/28/2021 23 0 - 40 IU/L Final         Passed - Completed PHQ-2 or PHQ-9 in the last 360 days      Passed - Valid encounter within last 12 months    Recent Outpatient Visits           6 months ago Diabetes mellitus type 2 in obese [facility])   [place] Karle Plumber B, MD   9 months ago Elevated blood pressure reading in office without diagnosis of hypertension   [place], [place] L, RPH-CPP   10 months ago Diabetes mellitus type 2 in obese [facility])   [place] Karle Plumber B, MD   1 year ago Diabetes mellitus type 2 in obese [facility])   [place] Karle Plumber B, MD   1 year ago Diabetes mellitus type 2 in obese [facility])   [place] Ladell Pier, MD

## 2022-12-07 ENCOUNTER — Other Ambulatory Visit: Payer: Self-pay | Admitting: Hematology and Oncology

## 2022-12-07 ENCOUNTER — Other Ambulatory Visit: Payer: Self-pay

## 2022-12-07 DIAGNOSIS — Z124 Encounter for screening for malignant neoplasm of cervix: Secondary | ICD-10-CM

## 2022-12-07 NOTE — Progress Notes (Signed)
Patient: Monserat Burhans           Date of Birth: [birth year]           MRN: [id number] Visit Date: 12/07/2022 PCP: Ladell Pier, MD  Cervical Cancer Screening Do you smoke?: No Have you ever had or been told you have an allergy to latex products?: No Marital status: Married Date of last pap smear: 2-5 yrs ago Date of last menstrual period:  (Irregular) Number of pregnancies: 3 Number of births: 3 Have you ever had any of the following? Hysterectomy: No Tubal ligation (tubes tied): No Abnormal bleeding: Yes Abnormal pap smear: No Venereal warts: No A sex partner with venereal warts: No  Cervical Exam Pap smear completed: Pap test Abnormal Observations: Normal exam Recommendations: Will follow up with Pap smear, if normal and negative HPV, will repeat in 5 years.       Patient's History Patient Active Problem List   Diagnosis Date Noted   Adhesive capsulitis of left shoulder 07/08/2022   Migraine without aura and without status migrainosus, not intractable 02/08/2022   Generalized anxiety disorder 06/08/2020   Elevated blood pressure reading 02/07/2020   Mixed hyperlipidemia 02/07/2020   Hyperlipidemia associated with type 2 diabetes mellitus ([place]) 06/05/2019   Controlled type 2 diabetes mellitus without complication, without long-term current use of insulin ([place]) 04/13/2018   Mastalgia in female 02/23/2017   NAFLD (nonalcoholic fatty liver disease) 02/23/2017   Class 1 obesity due to excess calories without serious comorbidity with body mass index (BMI) of 30.0 to 30.9 in adult 02/23/2017   Thyroid activity decreased 11/13/2014   Menorrhagia 04/02/2014   Past Medical History:  Diagnosis Date   Diabetes mellitus without complication ([place])    Menorrhagia    Thyroid disease     Family History  Problem Relation Age of Onset   Diabetes Father    Hypertension Father    Stroke Father    Hypertension Mother    Cancer Paternal Grandmother         stomach   Breast cancer Paternal Aunt     Social History   Occupational History   Not on file  Tobacco Use   Smoking status: Never   Smokeless tobacco: Never  Vaping Use   Vaping Use: Never used  Substance and Sexual Activity   Alcohol use: Yes    Comment: rarely   Drug use: No   Sexual activity: Yes    Partners: Male    Birth control/protection: None

## 2022-12-08 ENCOUNTER — Ambulatory Visit: Payer: Self-pay | Attending: Internal Medicine | Admitting: Internal Medicine

## 2022-12-08 ENCOUNTER — Other Ambulatory Visit: Payer: Self-pay

## 2022-12-08 VITALS — BP 133/83 | HR 78 | Temp 97.5°F | Ht 64.0 in | Wt 183.0 lb

## 2022-12-08 DIAGNOSIS — R1013 Epigastric pain: Secondary | ICD-10-CM

## 2022-12-08 DIAGNOSIS — E1169 Type 2 diabetes mellitus with other specified complication: Secondary | ICD-10-CM

## 2022-12-08 DIAGNOSIS — Z23 Encounter for immunization: Secondary | ICD-10-CM

## 2022-12-08 DIAGNOSIS — R1032 Left lower quadrant pain: Secondary | ICD-10-CM

## 2022-12-08 DIAGNOSIS — E669 Obesity, unspecified: Secondary | ICD-10-CM

## 2022-12-08 DIAGNOSIS — E782 Mixed hyperlipidemia: Secondary | ICD-10-CM

## 2022-12-08 DIAGNOSIS — M79602 Pain in left arm: Secondary | ICD-10-CM

## 2022-12-08 DIAGNOSIS — K219 Gastro-esophageal reflux disease without esophagitis: Secondary | ICD-10-CM

## 2022-12-08 LAB — POCT GLYCOSYLATED HEMOGLOBIN (HGB A1C): HbA1c, POC (controlled diabetic range): 7.5 % — AB (ref 0.0–7.0)

## 2022-12-08 MED ORDER — TRULICITY 0.75 MG/0.5ML ~~LOC~~ SOAJ
0.7500 mg | SUBCUTANEOUS | 6 refills | Status: DC
  Filled 2022-12-08 – 2023-01-06 (×2): qty 2, 28d supply, fill #0
  Filled 2023-02-02: qty 2, 28d supply, fill #1
  Filled 2023-03-03: qty 2, 28d supply, fill #2
  Filled 2023-03-31: qty 2, 28d supply, fill #3
  Filled 2023-04-28: qty 2, 28d supply, fill #4
  Filled 2023-05-22: qty 2, 28d supply, fill #5
  Filled 2023-06-23: qty 2, 28d supply, fill #6

## 2022-12-08 MED ORDER — METFORMIN HCL 500 MG PO TABS
ORAL_TABLET | ORAL | 2 refills | Status: DC
  Filled 2022-12-08 (×2): qty 540, 360d supply, fill #0
  Filled 2022-12-08: qty 270, fill #0
  Filled 2022-12-08 – 2022-12-23 (×2): qty 270, 90d supply, fill #0
  Filled 2023-03-31 (×2): qty 270, 90d supply, fill #1

## 2022-12-08 MED ORDER — CYCLOBENZAPRINE HCL 5 MG PO TABS
5.0000 mg | ORAL_TABLET | Freq: Every day | ORAL | 0 refills | Status: DC | PRN
  Filled 2022-12-08: qty 15, 15d supply, fill #0

## 2022-12-08 MED ORDER — PRAVASTATIN SODIUM 20 MG PO TABS
ORAL_TABLET | ORAL | 1 refills | Status: DC
Start: 2022-12-08 — End: 2023-05-08
  Filled 2022-12-08: qty 60, fill #0

## 2022-12-08 MED ORDER — OMEPRAZOLE 20 MG PO CPDR
20.0000 mg | DELAYED_RELEASE_CAPSULE | Freq: Every day | ORAL | 3 refills | Status: DC
  Filled 2022-12-08: qty 30, 30d supply, fill #0

## 2022-12-08 NOTE — Progress Notes (Signed)
Patient ID: Linda Delgado, female    DOB: 11-Jan-1971  MRN: [id number]  CC: Abdominal Pain (Stomach pain X56mo- pain getting worse & more frequent/L arm spasms, unable to lift arm completely - requesting refill on flexeril/Yes to flu vax. Med refill.)   Subjective: Linda Delgado a 52y.o. female who presents for chronic ds management Her concerns today include:  Pt with hx of NAFLD, obesity, DM, HL, anxiety.    AMN Language interpreter used during this encounter. #[id number] Mauricio   DM/Obesity: Results for orders placed or performed in visit on 12/08/22  POCT glycosylated hemoglobin (Hb A1C)  Result Value Ref Range   Hemoglobin A1C     HbA1c POC (<> result, manual entry)     HbA1c, POC (prediabetic range)     HbA1c, POC (controlled diabetic range) 7.5 (A) 0.0 - 7.0 %  A1C increased since last visit when it was 6.4 On metformin 500 mg twice a day and Trulicity 0A999333mg once a week.  Taking consistently Checks BS 2x/ wk in a.m; range 160s Could do better with eating habits.  Eating late at nights Not exercising due to abdominal discomfort.  Having LT lower to mid abdominal pain x several mths Constant and there every day.  Some burning sensation as well in LUQ.   No associated N/V. Some constipation at times.  No blood in stools, dysuria, abnormal vaginal discgh Worse with foods, stomach feels bloated after meals.  Today she ate a sandwich and did not get bloated.  Does get worse with spicy foods.  No major wgh changes  Requests refill on Flexeril for spasms in her left shoulder. Patient Active Problem List   Diagnosis Date Noted   Adhesive capsulitis of left shoulder 07/08/2022   Migraine without aura and without status migrainosus, not intractable 02/08/2022   Generalized anxiety disorder 06/08/2020   Elevated blood pressure reading 02/07/2020   Mixed hyperlipidemia 02/07/2020   Hyperlipidemia associated with type 2 diabetes mellitus ([place]  06/05/2019   Controlled type 2 diabetes mellitus without complication, without long-term current use of insulin ([place] 04/13/2018   Mastalgia in female 02/23/2017   NAFLD (nonalcoholic fatty liver disease) 02/23/2017   Class 1 obesity due to excess calories without serious comorbidity with body mass index (BMI) of 30.0 to 30.9 in adult 02/23/2017   Thyroid activity decreased 11/13/2014   Menorrhagia 04/02/2014     Current Outpatient Medications on File Prior to Visit  Medication Sig Dispense Refill   Blood Glucose Monitoring Suppl (TRUE METRIX METER) w/Device KIT 1 each by Does not apply route 3 (three) times daily. 1 kit 0   glucose blood (TRUE METRIX BLOOD GLUCOSE TEST) test strip USE AS INSTRUCTED 100 strip 6   meloxicam (MOBIC) 15 MG tablet Take 1 tablet (15 mg total) by mouth daily. 30 tablet 0   omega-3 acid ethyl esters (LOVAZA) 1 G capsule Take by mouth 2 (two) times daily.     topiramate (TOPAMAX) 25 MG tablet Take 1 tablet (25 mg total) by mouth at bedtime. 60 tablet 0   TRUEplus Lancets 28G MISC USE AS DIRECTED 100 each 6   Multiple Vitamin (MULTIVITAMIN) tablet Take 1 tablet by mouth daily. (Patient not taking: Reported on 12/08/2022)     No current facility-administered medications on file prior to visit.    Allergies  Allergen Reactions   Atorvastatin Other (See Comments)    Muscle pain    Social History   Socioeconomic History  Marital status: Married    Spouse name: Not on file   Number of children: 3   Years of education: Not on file   Highest education level: Bachelor's degree (e.g., BA, AB, BS)  Occupational History   Not on file  Tobacco Use   Smoking status: Never   Smokeless tobacco: Never  Vaping Use   Vaping Use: Never used  Substance and Sexual Activity   Alcohol use: Yes    Comment: rarely   Drug use: No   Sexual activity: Yes    Partners: Male    Birth control/protection: None  Other Topics Concern   Not on file  Social History Narrative    Patient doesn't like to talk on the phone   Social Determinants of Health   Financial Resource Strain: Not on file  Food Insecurity: No Food Insecurity (03/31/2022)   Hunger Vital Sign    Worried About Running Out of Food in the Last Year: Never true    Ran Out of Food in the Last Year: Never true  Transportation Needs: No Transportation Needs (03/31/2022)   PRAPARE - Transportation    Lack of Transportation (Medical): No    Lack of Transportation (Non-Medical): No  Physical Activity: Sufficiently Active (11/02/2017)   Exercise Vital Sign    Days of Exercise per Week: 4 days    Minutes of Exercise per Session: 60 min  Stress: Stress Concern Present (11/02/2017)   [place]    Feeling of Stress : To some extent  Social Connections: Moderately Integrated (11/02/2017)   Social Connection and Isolation Panel [NHANES]    Frequency of Communication with Friends and Family: Never    Frequency of Social Gatherings with Friends and Family: Twice a week    Attends Religious Services: More than 4 times per year    Active Member of [employer] or Organizations: Yes    Attends Archivist Meetings: More than 4 times per year    Marital Status: Married  Human resources officer Violence: Not At Risk (11/02/2017)   Humiliation, Afraid, Rape, and Kick questionnaire    Fear of Current or Ex-Partner: No    Emotionally Abused: No    Physically Abused: No    Sexually Abused: No    Family History  Problem Relation Age of Onset   Diabetes Father    Hypertension Father    Stroke Father    Hypertension Mother    Cancer Paternal Grandmother        stomach   Breast cancer Paternal Aunt     Past Surgical History:  Procedure Laterality Date   [place], 2007   WISDOM TOOTH EXTRACTION      ROS: Review of Systems Negative except as stated above  PHYSICAL EXAM: BP 133/83 (BP Location: Left Arm, Patient Position:  Sitting, Cuff Size: Normal)   Pulse 78   Temp (!) 97.5 F (36.4 C) (Oral)   Ht '5\' 4"'$  (1.626 m)   Wt 183 lb (83 kg)   SpO2 100%   BMI 31.41 kg/m   Wt Readings from Last 3 Encounters:  12/08/22 183 lb (83 kg)  05/19/22 182 lb (82.6 kg)  03/31/22 178 lb 1.6 oz (80.8 kg)    Physical Exam  General appearance - alert, well appearing, and in no distress Mental status - normal mood, behavior, speech, dress, motor activity, and thought processes Neck - supple, no significant adenopathy Chest - clear to auscultation, no wheezes,  rales or rhonchi, symmetric air entry Heart - normal rate, regular rhythm, normal S1, S2, no murmurs, rubs, clicks or gallops Abdomen -nondistended, normal bowel sounds, soft, mild tenderness in the epigastric, Left upper and mid quadrants without guarding or rebound Extremities - peripheral pulses normal, no pedal edema, no clubbing or cyanosis      Latest Ref Rng & Units 10/28/2021    8:43 AM 06/24/2021    2:52 PM 03/23/2021    3:38 PM  CMP  Glucose 70 - 99 mg/dL 120     BUN 6 - 24 mg/dL 13     Creatinine 0.57 - 1.00 mg/dL 0.72     Sodium 134 - 144 mmol/L 141     Potassium 3.5 - 5.2 mmol/L 4.6     Chloride 96 - 106 mmol/L 103     CO2 20 - 29 mmol/L 23     Calcium 8.7 - 10.2 mg/dL 9.5     Total Protein 6.0 - 8.5 g/dL 6.9  7.5  7.5   Total Bilirubin 0.0 - 1.2 mg/dL 0.4  0.4  0.5   Alkaline Phos 44 - 121 IU/L 83  90  122   AST 0 - 40 IU/L 23  22  59   ALT 0 - 32 IU/L 32  28  99    Lipid Panel     Component Value Date/Time   CHOL 206 (H) 05/19/2022 1219   TRIG 199 (H) 05/19/2022 1219   HDL 57 05/19/2022 1219   CHOLHDL 3.6 05/19/2022 1219   CHOLHDL 3.8 02/24/2014 0900   VLDL 33 02/24/2014 0900   LDLCALC 115 (H) 05/19/2022 1219    CBC    Component Value Date/Time   WBC 6.9 02/08/2022 1156   WBC 6.0 11/13/2014 1137   RBC 4.81 02/08/2022 1156   RBC 4.23 11/13/2014 1137   HGB 14.0 02/08/2022 1156   HCT 41.5 02/08/2022 1156   PLT 273 02/08/2022  1156   MCV 86 02/08/2022 1156   MCH 29.1 02/08/2022 1156   MCH 28.6 11/13/2014 1137   MCHC 33.7 02/08/2022 1156   MCHC 34.0 11/13/2014 1137   RDW 12.8 02/08/2022 1156   LYMPHSABS 2.5 11/13/2014 1137   MONOABS 0.4 11/13/2014 1137   EOSABS 0.2 11/13/2014 1137   BASOSABS 0.0 11/13/2014 1137    ASSESSMENT AND PLAN:  1. Diabetes mellitus type 2 in obese ([place]) Close to goal.  Encouraged her to gently eat her last meal before 7 PM in the evenings.  Increase metformin to 1000 mg in the morning and continue 1 tablet in the evenings.  Continue Trulicity. - POCT glucose (manual entry) - POCT glycosylated hemoglobin (Hb A1C) - Dulaglutide (TRULICITY) A999333 0000000 SOPN; Inject 0.75 mg into the skin once a week.  Dispense: 2 mL; Refill: 6 - metFORMIN (GLUCOPHAGE) 500 MG tablet; Take 2 tablets (1,000 mg total) by mouth every morning AND 1 tablet (500 mg total) every evening.  Dispense: 270 tablet; Refill: 2 - Microalbumin / creatinine urine ratio - CBC - Comprehensive metabolic panel  2. Gastroesophageal reflux disease without esophagitis GERD precautions discussed.  Advised to avoid certain foods like spicy foods, tomato-based foods, juices and excessive caffeine.  Advised to eat his last meal at least 2 to 3 hours before laying down at nights and to sleep with his head slightly elevated.   Stop Naprosyn. Start omeprazole 20 mg daily  3. Left lower quadrant abdominal pain See #2 above. Try over-the-counter stool softeners for constipation. Follow-up if no  improvement. 4. Dyspepsia - H.pylori screen, POC  5. Mixed hyperlipidemia - pravastatin (PRAVACHOL) 20 MG tablet; Take 1 tablet by mouth every Mon/Wed/Fri, Sat and Sun  Dispense: 60 tablet; Refill: 1  6. Arm pain, left - cyclobenzaprine (FLEXERIL) 5 MG tablet; Take 1 tablet (5 mg total) by mouth daily as needed for muscle spasms.  Dispense: 15 tablet; Refill: 0  7. Need for immunization against influenza - Flu Vaccine QUAD 60moIM  (Fluarix, Fluzone & Alfiuria Quad PF)    Patient was given the opportunity to ask questions.  Patient verbalized understanding of the plan and was able to repeat key elements of the plan.   This documentation was completed using DRadio producer  Any transcriptional errors are unintentional.  Orders Placed This Encounter  Procedures   Flu Vaccine QUAD 682moM (Fluarix, Fluzone & Alfiuria Quad PF)   Microalbumin / creatinine urine ratio   CBC   Comprehensive metabolic panel   POCT glucose (manual entry)   POCT glycosylated hemoglobin (Hb A1C)   H.pylori screen, POC     Requested Prescriptions   Signed Prescriptions Disp Refills   Dulaglutide (TRULICITY) 0.A999333G0000000OPN 2 mL 6    Sig: Inject 0.75 mg into the skin once a week.   metFORMIN (GLUCOPHAGE) 500 MG tablet 270 tablet 2    Sig: Take 2 tablets (1,000 mg total) by mouth every morning AND 1 tablet (500 mg total) every evening.   pravastatin (PRAVACHOL) 20 MG tablet 60 tablet 1    Sig: Take 1 tablet by mouth every Mon/Wed/Fri, Sat and Sun   cyclobenzaprine (FLEXERIL) 5 MG tablet 15 tablet 0    Sig: Take 1 tablet (5 mg total) by mouth daily as needed for muscle spasms.   omeprazole (PRILOSEC) 20 MG capsule 30 capsule 3    Sig: Take 1 capsule (20 mg total) by mouth daily.    Return in about 4 months (around 04/08/2023).  DeKarle PlumberMD, FACP

## 2022-12-09 ENCOUNTER — Other Ambulatory Visit: Payer: Self-pay | Admitting: Internal Medicine

## 2022-12-09 ENCOUNTER — Ambulatory Visit: Payer: Self-pay

## 2022-12-09 DIAGNOSIS — R7989 Other specified abnormal findings of blood chemistry: Secondary | ICD-10-CM

## 2022-12-09 LAB — CYTOLOGY - PAP
Comment: NEGATIVE
Diagnosis: NEGATIVE
High risk HPV: NEGATIVE

## 2022-12-10 LAB — COMPREHENSIVE METABOLIC PANEL
ALT: 81 IU/L — ABNORMAL HIGH (ref 0–32)
AST: 51 IU/L — ABNORMAL HIGH (ref 0–40)
Albumin/Globulin Ratio: 1.8 (ref 1.2–2.2)
Albumin: 4.8 g/dL (ref 3.8–4.9)
Alkaline Phosphatase: 113 IU/L (ref 44–121)
BUN/Creatinine Ratio: 20 (ref 9–23)
BUN: 14 mg/dL (ref 6–24)
Bilirubin Total: 0.4 mg/dL (ref 0.0–1.2)
CO2: 22 mmol/L (ref 20–29)
Calcium: 9.4 mg/dL (ref 8.7–10.2)
Chloride: 102 mmol/L (ref 96–106)
Creatinine, Ser: 0.7 mg/dL (ref 0.57–1.00)
Globulin, Total: 2.7 g/dL (ref 1.5–4.5)
Glucose: 101 mg/dL — ABNORMAL HIGH (ref 70–99)
Potassium: 4.1 mmol/L (ref 3.5–5.2)
Sodium: 138 mmol/L (ref 134–144)
Total Protein: 7.5 g/dL (ref 6.0–8.5)
eGFR: 104 mL/min/{1.73_m2} (ref >59)

## 2022-12-10 LAB — MICROALBUMIN / CREATININE URINE RATIO
Creatinine, Urine: 45 mg/dL
Microalb/Creat Ratio: 12 mg/g creat (ref 0–29)
Microalbumin, Urine: 5.3 ug/mL

## 2022-12-10 LAB — CBC
Hematocrit: 40.4 % (ref 34.0–46.6)
Hemoglobin: 13.8 g/dL (ref 11.1–15.9)
MCH: 29.9 pg (ref 26.6–33.0)
MCHC: 34.2 g/dL (ref 31.5–35.7)
MCV: 87 fL (ref 79–97)
Platelets: 241 10*3/uL (ref 150–450)
RBC: 4.62 x10E6/uL (ref 3.77–5.28)
RDW: 12.3 % (ref 11.7–15.4)
WBC: 8.5 10*3/uL (ref 3.4–10.8)

## 2022-12-12 ENCOUNTER — Ambulatory Visit: Payer: Self-pay | Attending: Internal Medicine

## 2022-12-12 ENCOUNTER — Other Ambulatory Visit: Payer: Self-pay

## 2022-12-12 DIAGNOSIS — R1013 Epigastric pain: Secondary | ICD-10-CM

## 2022-12-12 DIAGNOSIS — R7989 Other specified abnormal findings of blood chemistry: Secondary | ICD-10-CM

## 2022-12-13 ENCOUNTER — Telehealth: Payer: Self-pay | Admitting: Internal Medicine

## 2022-12-13 DIAGNOSIS — R1013 Epigastric pain: Secondary | ICD-10-CM

## 2022-12-13 LAB — HEPATIC FUNCTION PANEL
ALT: 82 IU/L — ABNORMAL HIGH (ref 0–32)
AST: 50 IU/L — ABNORMAL HIGH (ref 0–40)
Albumin: 4.7 g/dL (ref 3.8–4.9)
Alkaline Phosphatase: 113 IU/L (ref 44–121)
Bilirubin Total: 0.4 mg/dL (ref 0.0–1.2)
Bilirubin, Direct: 0.1 mg/dL (ref 0.00–0.40)
Total Protein: 7.2 g/dL (ref 6.0–8.5)

## 2022-12-14 ENCOUNTER — Other Ambulatory Visit: Payer: Self-pay

## 2022-12-14 ENCOUNTER — Other Ambulatory Visit: Payer: Self-pay | Admitting: Internal Medicine

## 2022-12-14 DIAGNOSIS — R1013 Epigastric pain: Secondary | ICD-10-CM

## 2022-12-17 ENCOUNTER — Other Ambulatory Visit: Payer: Self-pay | Admitting: Internal Medicine

## 2022-12-17 LAB — H. PYLORI BREATH TEST: H pylori Breath Test: POSITIVE — AB

## 2022-12-17 MED ORDER — AMOXICILLIN 500 MG PO CAPS
1000.0000 mg | ORAL_CAPSULE | Freq: Two times a day (BID) | ORAL | 0 refills | Status: AC
  Filled 2022-12-17: qty 40, 10d supply, fill #0

## 2022-12-17 MED ORDER — CLARITHROMYCIN 500 MG PO TABS
500.0000 mg | ORAL_TABLET | Freq: Two times a day (BID) | ORAL | 0 refills | Status: DC
  Filled 2022-12-17: qty 20, 10d supply, fill #0

## 2022-12-17 MED ORDER — OMEPRAZOLE 20 MG PO CPDR
20.0000 mg | DELAYED_RELEASE_CAPSULE | Freq: Two times a day (BID) | ORAL | 0 refills | Status: DC
  Filled 2022-12-17 – 2022-12-19 (×2): qty 20, 10d supply, fill #0

## 2022-12-19 ENCOUNTER — Other Ambulatory Visit: Payer: Self-pay

## 2022-12-19 ENCOUNTER — Telehealth: Payer: Self-pay | Admitting: Emergency Medicine

## 2022-12-19 NOTE — Telephone Encounter (Signed)
Called and spoke to the patient. Verified name & DOB. Informed of results and to pick up medications from the pharmacy. Patient expressed verbal understanding and confirmed that she has already picked up the medication. No further questions at this time.

## 2022-12-19 NOTE — Telephone Encounter (Signed)
Copied from [place] [id number]. Topic: General - Other >> Dec 19, 2022  1:31 PM Leone Payor F wrote: Reason for CRM: Pt is returning a call from [facility] regarding her lab results.

## 2022-12-23 ENCOUNTER — Other Ambulatory Visit: Payer: Self-pay

## 2022-12-23 ENCOUNTER — Other Ambulatory Visit: Payer: Self-pay | Admitting: Internal Medicine

## 2022-12-23 ENCOUNTER — Ambulatory Visit: Payer: Self-pay

## 2022-12-23 DIAGNOSIS — R7989 Other specified abnormal findings of blood chemistry: Secondary | ICD-10-CM

## 2022-12-30 ENCOUNTER — Ambulatory Visit: Payer: Self-pay | Attending: Internal Medicine

## 2022-12-30 DIAGNOSIS — R7989 Other specified abnormal findings of blood chemistry: Secondary | ICD-10-CM

## 2022-12-31 ENCOUNTER — Other Ambulatory Visit: Payer: Self-pay | Admitting: Internal Medicine

## 2022-12-31 DIAGNOSIS — R7989 Other specified abnormal findings of blood chemistry: Secondary | ICD-10-CM

## 2022-12-31 LAB — HEPATIC FUNCTION PANEL
ALT: 68 IU/L — ABNORMAL HIGH (ref 0–32)
AST: 41 IU/L — ABNORMAL HIGH (ref 0–40)
Albumin: 4.6 g/dL (ref 3.8–4.9)
Alkaline Phosphatase: 131 IU/L — ABNORMAL HIGH (ref 44–121)
Bilirubin Total: 0.3 mg/dL (ref 0.0–1.2)
Bilirubin, Direct: <0.1 mg/dL (ref 0.00–0.40)
Total Protein: 7.1 g/dL (ref 6.0–8.5)

## 2023-01-06 ENCOUNTER — Other Ambulatory Visit: Payer: Self-pay

## 2023-01-12 ENCOUNTER — Ambulatory Visit (HOSPITAL_COMMUNITY)
Admission: RE | Admit: 2023-01-12 | Discharge: 2023-01-12 | Disposition: A | Discharge status: Home / Self Care | Payer: Self-pay | Source: Ambulatory Visit | Attending: Internal Medicine | Admitting: Internal Medicine

## 2023-01-12 DIAGNOSIS — R7989 Other specified abnormal findings of blood chemistry: Secondary | ICD-10-CM | POA: Insufficient documentation

## 2023-02-02 ENCOUNTER — Other Ambulatory Visit: Payer: Self-pay

## 2023-02-09 ENCOUNTER — Other Ambulatory Visit: Payer: Self-pay

## 2023-03-03 ENCOUNTER — Other Ambulatory Visit: Payer: Self-pay

## 2023-03-31 ENCOUNTER — Other Ambulatory Visit: Payer: Self-pay

## 2023-04-05 ENCOUNTER — Ambulatory Visit
Admission: EM | Admit: 2023-04-05 | Discharge: 2023-04-05 | Disposition: A | Discharge status: Home / Self Care | Payer: Self-pay | Attending: Family Medicine | Admitting: Family Medicine

## 2023-04-05 ENCOUNTER — Other Ambulatory Visit: Payer: Self-pay

## 2023-04-05 DIAGNOSIS — R051 Acute cough: Secondary | ICD-10-CM

## 2023-04-05 DIAGNOSIS — J029 Acute pharyngitis, unspecified: Secondary | ICD-10-CM

## 2023-04-05 MED ORDER — LIDOCAINE VISCOUS HCL 2 % MT SOLN
5.0000 mL | Freq: Four times a day (QID) | OROMUCOSAL | 0 refills | Status: DC | PRN
  Filled 2023-04-05: qty 360, 18d supply, fill #0

## 2023-04-05 MED ORDER — HYDROCODONE BIT-HOMATROP MBR 5-1.5 MG/5ML PO SOLN
5.0000 mL | Freq: Four times a day (QID) | ORAL | 0 refills | Status: DC | PRN
  Filled 2023-04-05: qty 90, 5d supply, fill #0

## 2023-04-05 NOTE — ED Triage Notes (Signed)
Patient with c/o cough and scratchy throat for 5 days. Patient states she is coughing up green mucus. Denies using any OTC medications.

## 2023-04-05 NOTE — ED Provider Notes (Signed)
[facility]   [id number] 04/05/23 Arrival Time: 1145  ASSESSMENT & PLAN:  1. Acute cough   2. Sore throat    Discussed typical duration of likely viral illness. OTC symptom care as needed.  Discharge Medication List as of 04/05/2023 12:12 PM     START taking these medications   Details  HYDROcodone bit-homatropine (HYCODAN) 5-1.5 MG/5ML syrup Take 5 mLs by mouth every 6 (six) hours as needed for cough., Starting Wed 04/05/2023, Normal    magic mouthwash (lidocaine, diphenhydrAMINE, alum & mag hydroxide) suspension Swish and spit 5 mLs 4 (four) times daily as needed for mouth pain., Starting Wed 04/05/2023, Normal         Follow-up Information     Marcine Matar, MD.   Specialty: Internal Medicine Why: If worsening or failing to improve as anticipated. Contact information: [address] [place] [address] [phone]                 Reviewed expectations re: course of current medical issues. Questions answered. Outlined signs and symptoms indicating need for more acute intervention. Understanding verbalized. After Visit Summary given.   SUBJECTIVE: History from: Patient. Linda Delgado is a 52 y.o. female. Patient with c/o cough and scratchy throat for 5 days. Patient states she is coughing up green mucus. Denies using any OTC medications.  Denies: fever and difficulty breathing. Cough is affecting sleep. Normal PO intake without n/v/d.  OBJECTIVE:  Vitals:   04/05/23 1155  BP: 128/66  Pulse: 79  Resp: 18  Temp: 97.7 F (36.5 C)  TempSrc: Oral  SpO2: 97%    General appearance: alert; no distress Eyes: PERRLA; EOMI; conjunctiva normal HENT: [place]; AT; with nasal congestion; throat with mild cobblestoning Neck: supple  Lungs: speaks full sentences without difficulty; unlabored; dry cough Extremities: no edema Skin: warm and dry Neurologic: normal gait Psychological: alert and cooperative; normal mood and  affect  Allergies  Allergen Reactions   Atorvastatin Other (See Comments)    Muscle pain    Past Medical History:  Diagnosis Date   Diabetes mellitus without complication (H[facility])    Menorrhagia    Thyroid disease    Social History   Socioeconomic History   Marital status: Married    Spouse name: Not on file   Number of children: 3   Years of education: Not on file   Highest education level: Bachelor's degree (e.g., BA, AB, BS)  Occupational History   Not on file  Tobacco Use   Smoking status: Never   Smokeless tobacco: Never  Vaping Use   Vaping Use: Never used  Substance and Sexual Activity   Alcohol use: Yes    Comment: rarely   Drug use: No   Sexual activity: Yes    Partners: Male    Birth control/protection: None  Other Topics Concern   Not on file  Social History Narrative   Patient doesn't like to talk on the phone   Social Determinants of Health   Financial Resource Strain: Not on file  Food Insecurity: No Food Insecurity (03/31/2022)   Hunger Vital Sign    Worried About Running Out of Food in the Last Year: Never true    Ran Out of Food in the Last Year: Never true  Transportation Needs: No Transportation Needs (03/31/2022)   PRAPARE - Transportation    Lack of Transportation (Medical): No    Lack of Transportation (Non-Medical): No  Physical Activity: Sufficiently Active (11/02/2017)  Exercise Vital Sign    Days of Exercise per Week: 4 days    Minutes of Exercise per Session: 60 min  Stress: Stress Concern Present (11/02/2017)   [employer] of Occupational Health - Occupational Stress Questionnaire    Feeling of Stress : To some extent  Social Connections: Moderately Integrated (11/02/2017)   Social Connection and Isolation Panel [NHANES]    Frequency of Communication with Friends and Family: Never    Frequency of Social Gatherings with Friends and Family: Twice a week    Attends Religious Services: More than 4 times per year    Active Member  of Clubs or Organizations: Yes    Attends Banker Meetings: More than 4 times per year    Marital Status: Married  Catering manager Violence: Not At Risk (11/02/2017)   Humiliation, Afraid, Rape, and Kick questionnaire    Fear of Current or Ex-Partner: No    Emotionally Abused: No    Physically Abused: No    Sexually Abused: No   Family History  Problem Relation Age of Onset   Diabetes Father    Hypertension Father    Stroke Father    Hypertension Mother    Cancer Paternal Grandmother        stomach   Breast cancer Paternal Aunt    Past Surgical History:  Procedure Laterality Date   CESAREAN SECTION  1997, 1999, 2007   WISDOM TOOTH EXTRACTION       Mardella Layman, MD 04/05/23 [phone]

## 2023-04-06 ENCOUNTER — Other Ambulatory Visit: Payer: Self-pay

## 2023-04-07 ENCOUNTER — Other Ambulatory Visit: Payer: Self-pay

## 2023-04-14 ENCOUNTER — Ambulatory Visit: Payer: Self-pay | Admitting: Internal Medicine

## 2023-04-28 ENCOUNTER — Other Ambulatory Visit: Payer: Self-pay

## 2023-05-08 ENCOUNTER — Other Ambulatory Visit: Payer: Self-pay

## 2023-05-08 ENCOUNTER — Encounter: Payer: Self-pay | Admitting: Internal Medicine

## 2023-05-08 ENCOUNTER — Ambulatory Visit: Payer: Self-pay | Attending: Internal Medicine | Admitting: Internal Medicine

## 2023-05-08 VITALS — BP 128/80 | HR 73 | Ht 64.0 in | Wt 185.0 lb

## 2023-05-08 DIAGNOSIS — E669 Obesity, unspecified: Secondary | ICD-10-CM

## 2023-05-08 DIAGNOSIS — E782 Mixed hyperlipidemia: Secondary | ICD-10-CM

## 2023-05-08 DIAGNOSIS — R1032 Left lower quadrant pain: Secondary | ICD-10-CM

## 2023-05-08 DIAGNOSIS — K760 Fatty (change of) liver, not elsewhere classified: Secondary | ICD-10-CM

## 2023-05-08 DIAGNOSIS — E1169 Type 2 diabetes mellitus with other specified complication: Secondary | ICD-10-CM

## 2023-05-08 DIAGNOSIS — R7989 Other specified abnormal findings of blood chemistry: Secondary | ICD-10-CM

## 2023-05-08 LAB — GLUCOSE, POCT (MANUAL RESULT ENTRY): POC Glucose: 140 mg/dl — AB (ref 70–99)

## 2023-05-08 LAB — POCT GLYCOSYLATED HEMOGLOBIN (HGB A1C): HbA1c, POC (controlled diabetic range): 8 % — AB (ref 0.0–7.0)

## 2023-05-08 MED ORDER — METFORMIN HCL 1000 MG PO TABS
ORAL_TABLET | ORAL | 1 refills | Status: DC
  Filled 2023-05-08: qty 180, 90d supply, fill #0
  Filled 2023-08-10: qty 180, 90d supply, fill #1

## 2023-05-08 NOTE — Progress Notes (Signed)
Patient ID: Linda Delgado, female    DOB: 01-May-1971  MRN: [id number]  CC: Diabetes (DM f/u. /Intermittent pain on L side of abdomen x1 yr)   Subjective: Linda Delgado is a 52 y.o. female who presents for chronic disease management Her concerns today include:  Pt with hx of NAFLD, obesity, DM, HL, anxiety.    [employer] Language interpreter used during this encounter. #[id number], Jose  DM: Results for orders placed or performed in visit on 05/08/23  POCT glucose (manual entry)  Result Value Ref Range   POC Glucose 140 (A) 70 - 99 mg/dl  POCT glycosylated hemoglobin (Hb A1C)  Result Value Ref Range   Hemoglobin A1C     HbA1c POC (<> result, manual entry)     HbA1c, POC (prediabetic range)     HbA1c, POC (controlled diabetic range) 8.0 (A) 0.0 - 7.0 %  A1C increased from 7.5 on last visit Confirms taking Metformin 1000 mg a.m/500 mg Q p.m and Trulicity 0.75 mg Q wk.  Tolerating Trulicity okay. However about 4-5x/wk she gets a burning/pain LT lower abdomin  for over 1 yr. she thinks it may last a few minutes but not sure; has not really paid attention to how long it last.  No dysuria or abnormal vaginal discharge.  Not related to food..   Checks BS 4x/wk before BF.  Gives range 140-160.  Feels she is doing well with eating habits. Not getting much exercise.  Feels tire, lack energy. HL:  We had her hold Pravachol because AST/ALT remained mildly elevated, Alk phs was 131. Liver US showed fatty liver changes   Patient Active Problem List   Diagnosis Date Noted   Adhesive capsulitis of left shoulder 07/08/2022   Migraine without aura and without status migrainosus, not intractable 02/08/2022   Generalized anxiety disorder 06/08/2020   Elevated blood pressure reading 02/07/2020   Mixed hyperlipidemia 02/07/2020   Hyperlipidemia associated with type 2 diabetes mellitus (HCC) 06/05/2019   Controlled type 2 diabetes mellitus without complication, without long-term  current use of insulin (HCC) 04/13/2018   Mastalgia in female 02/23/2017   NAFLD (nonalcoholic fatty liver disease) 40/34/7425   Class 1 obesity due to excess calories without serious comorbidity with body mass index (BMI) of 30.0 to 30.9 in adult 02/23/2017   Thyroid activity decreased 11/13/2014   Menorrhagia 04/02/2014     Current Outpatient Medications on File Prior to Visit  Medication Sig Dispense Refill   Blood Glucose Monitoring Suppl (TRUE METRIX METER) w/Device KIT 1 each by Does not apply route 3 (three) times daily. 1 kit 0   Dulaglutide (TRULICITY) 0.75 MG/0.5ML SOPN Inject 0.75 mg into the skin once a week. 2 mL 6   glucose blood (TRUE METRIX BLOOD GLUCOSE TEST) test strip USE AS INSTRUCTED 100 strip 6   TRUEplus Lancets 28G MISC USE AS DIRECTED 100 each 6   Multiple Vitamin (MULTIVITAMIN) tablet Take 1 tablet by mouth daily. (Patient not taking: Reported on 12/08/2022)     omeprazole (PRILOSEC) 20 MG capsule Take 1 capsule (20 mg total) by mouth 2 (two) times daily before a meal. (Patient not taking: Reported on 05/08/2023) 20 capsule 0   No current facility-administered medications on file prior to visit.    Allergies  Allergen Reactions   Atorvastatin Other (See Comments)    Muscle pain    Social History   Socioeconomic History   Marital status: Married    Spouse name: Not on file   Number  of children: 3   Years of education: Not on file   Highest education level: Bachelor's degree (e.g., BA, AB, BS)  Occupational History   Not on file  Tobacco Use   Smoking status: Never   Smokeless tobacco: Never  Vaping Use   Vaping status: Never Used  Substance and Sexual Activity   Alcohol use: Yes    Comment: rarely   Drug use: No   Sexual activity: Yes    Partners: Male    Birth control/protection: None  Other Topics Concern   Not on file  Social History Narrative   Patient doesn't like to talk on the phone   Social Determinants of Health   Financial  Resource Strain: Not on file  Food Insecurity: No Food Insecurity (03/31/2022)   Hunger Vital Sign    Worried About Running Out of Food in the Last Year: Never true    Ran Out of Food in the Last Year: Never true  Transportation Needs: No Transportation Needs (03/31/2022)   PRAPARE - Transportation    Lack of Transportation (Medical): No    Lack of Transportation (Non-Medical): No  Physical Activity: Sufficiently Active (11/02/2017)   Exercise Vital Sign    Days of Exercise per Week: 4 days    Minutes of Exercise per Session: 60 min  Stress: Stress Concern Present (11/02/2017)   [employer] of Occupational Health - Occupational Stress Questionnaire    Feeling of Stress : To some extent  Social Connections: Moderately Integrated (11/02/2017)   Social Connection and Isolation Panel [NHANES]    Frequency of Communication with Friends and Family: Never    Frequency of Social Gatherings with Friends and Family: Twice a week    Attends Religious Services: More than 4 times per year    Active Member of [employer] or Organizations: Yes    Attends Banker Meetings: More than 4 times per year    Marital Status: Married  Catering manager Violence: Not At Risk (11/02/2017)   Humiliation, Afraid, Rape, and Kick questionnaire    Fear of Current or Ex-Partner: No    Emotionally Abused: No    Physically Abused: No    Sexually Abused: No    Family History  Problem Relation Age of Onset   Diabetes Father    Hypertension Father    Stroke Father    Hypertension Mother    Cancer Paternal Grandmother        stomach   Breast cancer Paternal Aunt     Past Surgical History:  Procedure Laterality Date   CESAREAN SECTION  1997, 1999, 2007   WISDOM TOOTH EXTRACTION      ROS: Review of Systems Negative except as stated above  PHYSICAL EXAM: BP 128/80 (BP Location: Left Arm, Patient Position: Sitting, Cuff Size: Normal)   Pulse 73   Ht 5\' 4"  (1.626 m)   Wt 185 lb (83.9 kg)   SpO2  99%   BMI 31.76 kg/m   Wt Readings from Last 3 Encounters:  05/08/23 185 lb (83.9 kg)  12/08/22 183 lb (83 kg)  05/19/22 182 lb (82.6 kg)    Physical Exam  General appearance - alert, well appearing, middle-age Hispanic female and in no distress Mental status - normal mood, behavior, speech, dress, motor activity, and thought processes Neck - supple, no significant adenopathy Chest - clear to auscultation, no wheezes, rales or rhonchi, symmetric air entry Heart - normal rate, regular rhythm, normal S1, S2, no murmurs, rubs, clicks or gallops Abdomen -  soft, nontender, nondistended, no masses or organomegaly      Latest Ref Rng & Units 12/30/2022    8:57 AM 12/12/2022    8:54 AM 12/08/2022    4:46 PM  CMP  Glucose 70 - 99 mg/dL   347   BUN 6 - 24 mg/dL   14   Creatinine 4.25 - 1.00 mg/dL   9.56   Sodium 387 - 564 mmol/L   138   Potassium 3.5 - 5.2 mmol/L   4.1   Chloride 96 - 106 mmol/L   102   CO2 20 - 29 mmol/L   22   Calcium 8.7 - 10.2 mg/dL   9.4   Total Protein 6.0 - 8.5 g/dL 7.1  7.2  7.5   Total Bilirubin 0.0 - 1.2 mg/dL 0.3  0.4  0.4   Alkaline Phos 44 - 121 IU/L 131  113  113   AST 0 - 40 IU/L 41  50  51   ALT 0 - 32 IU/L 68  82  81    Lipid Panel     Component Value Date/Time   CHOL 206 (H) 05/19/2022 1219   TRIG 199 (H) 05/19/2022 1219   HDL 57 05/19/2022 1219   CHOLHDL 3.6 05/19/2022 1219   CHOLHDL 3.8 02/24/2014 0900   VLDL 33 02/24/2014 0900   LDLCALC 115 (H) 05/19/2022 1219    CBC    Component Value Date/Time   WBC 8.5 12/08/2022 1646   WBC 6.0 11/13/2014 1137   RBC 4.62 12/08/2022 1646   RBC 4.23 11/13/2014 1137   HGB 13.8 12/08/2022 1646   HCT 40.4 12/08/2022 1646   PLT 241 12/08/2022 1646   MCV 87 12/08/2022 1646   MCH 29.9 12/08/2022 1646   MCH 28.6 11/13/2014 1137   MCHC 34.2 12/08/2022 1646   MCHC 34.0 11/13/2014 1137   RDW 12.3 12/08/2022 1646   LYMPHSABS 2.5 11/13/2014 1137   MONOABS 0.4 11/13/2014 1137   EOSABS 0.2 11/13/2014  1137   BASOSABS 0.0 11/13/2014 1137    ASSESSMENT AND PLAN: 1. Type 2 diabetes mellitus with obesity (HCC) Not at goal. Discussed on encourage healthy eating habits. Encouraged her to try to get in some exercise.  We discussed starting low at 10 minutes 3 days a week and then gradually increasing up to 30 minutes. Continue Trulicity 0.75 mg once a week. Increase metformin to 1000 mg twice a day. - POCT glucose (manual entry) - POCT glycosylated hemoglobin (Hb A1C) - metFORMIN (GLUCOPHAGE) 1000 MG tablet; Take 1 tablet (1,000 mg total) by mouth every morning AND 1 tablet (1,000 mg total) every evening.  Dispense: 180 tablet; Refill: 1  2. Abnormal LFTs Recheck LFTs and lipid profile today.  Will make a decision based on lab results about whether to restart Pravachol. - Hepatic function panel  3. Mixed hyperlipidemia - Lipid panel  4. Fatty liver Discussed the importance of healthy eating habits, regular exercise and achieving some weight loss.  5. Left lower quadrant abdominal pain Will observe for now.  Advised patient to pay attention when it occurs as to how long it would last.  We can revisit on next visit to determine whether pelvic ultrasound would be indicated.    Patient was given the opportunity to ask questions.  Patient verbalized understanding of the plan and was able to repeat key elements of the plan.   This documentation was completed using Paediatric nurse.  Any transcriptional errors are unintentional.  Orders Placed  This Encounter  Procedures   Hepatic function panel   Lipid panel   POCT glucose (manual entry)   POCT glycosylated hemoglobin (Hb A1C)     Requested Prescriptions   Signed Prescriptions Disp Refills   metFORMIN (GLUCOPHAGE) 1000 MG tablet 180 tablet 1    Sig: Take 1 tablet (1,000 mg total) by mouth every morning AND 1 tablet (1,000 mg total) every evening.    No follow-ups on file.  Jonah Blue, MD, FACP

## 2023-05-08 NOTE — Patient Instructions (Signed)
Increase metformin to 1000 mg twice a day. Please pay attention to when you get the pain in the left lower abdomen -how often it occurs and how long it lasts.

## 2023-05-10 ENCOUNTER — Other Ambulatory Visit: Payer: Self-pay | Admitting: Internal Medicine

## 2023-05-10 ENCOUNTER — Other Ambulatory Visit: Payer: Self-pay

## 2023-05-10 DIAGNOSIS — R7989 Other specified abnormal findings of blood chemistry: Secondary | ICD-10-CM

## 2023-05-10 MED ORDER — PRAVASTATIN SODIUM 20 MG PO TABS
20.0000 mg | ORAL_TABLET | ORAL | 3 refills | Status: DC
  Filled 2023-05-10: qty 12, 28d supply, fill #0
  Filled 2023-06-09: qty 12, 28d supply, fill #1
  Filled 2023-07-10: qty 12, 28d supply, fill #2

## 2023-05-10 NOTE — Progress Notes (Signed)
Let patient know that Linda Delgado liver function test remains elevated despite being off Pravachol.  I think this elevation is due to fat deposited in the liver as seen on ultrasound done in April of this year.  Healthy eating habits and weight loss are extremely important in helping to correct this.  I would like to refer Linda Delgado to the gastroenterologist as well for further evaluation/management.  Cholesterol levels remain elevated and more so than 1 year ago.  I recommend restarting the pravastatin 20 mg and taking every Mon/Wed and Friday.  After being on it for 2 wks, return to the lab for liver function test recheck.  Rest of this is for my info:   Fibrosis 4 Score = 1.32 (Low risk)         Interpretation for patients with HCV          <1.45       -  F0-F1 (Low risk)          1.45-3.25 -  Indeterminate           >3.25      -  F3-F4 (High risk)     Validated for ages 2-65

## 2023-05-22 ENCOUNTER — Other Ambulatory Visit: Payer: Self-pay

## 2023-05-29 ENCOUNTER — Ambulatory Visit: Payer: Self-pay | Attending: Internal Medicine

## 2023-05-29 ENCOUNTER — Other Ambulatory Visit: Payer: Self-pay | Admitting: Internal Medicine

## 2023-05-29 DIAGNOSIS — R7989 Other specified abnormal findings of blood chemistry: Secondary | ICD-10-CM

## 2023-05-30 LAB — HCV AB W REFLEX TO QUANT PCR: HCV Ab: NONREACTIVE

## 2023-05-30 LAB — HEPATIC FUNCTION PANEL
ALT: 80 IU/L — ABNORMAL HIGH (ref 0–32)
AST: 48 IU/L — ABNORMAL HIGH (ref 0–40)
Albumin: 4.7 g/dL (ref 3.8–4.9)
Alkaline Phosphatase: 101 IU/L (ref 44–121)
Bilirubin Total: 0.4 mg/dL (ref 0.0–1.2)
Bilirubin, Direct: 0.11 mg/dL (ref 0.00–0.40)
Total Protein: 7.1 g/dL (ref 6.0–8.5)

## 2023-05-30 LAB — HCV INTERPRETATION

## 2023-05-31 ENCOUNTER — Other Ambulatory Visit: Payer: Self-pay

## 2023-06-09 ENCOUNTER — Other Ambulatory Visit: Payer: Self-pay

## 2023-06-23 ENCOUNTER — Other Ambulatory Visit: Payer: Self-pay

## 2023-07-10 ENCOUNTER — Other Ambulatory Visit: Payer: Self-pay

## 2023-07-10 ENCOUNTER — Other Ambulatory Visit (HOSPITAL_COMMUNITY): Payer: Self-pay

## 2023-07-17 ENCOUNTER — Encounter: Payer: Self-pay | Admitting: Internal Medicine

## 2023-07-21 ENCOUNTER — Encounter: Payer: Self-pay | Admitting: Gastroenterology

## 2023-07-24 ENCOUNTER — Other Ambulatory Visit: Payer: Self-pay

## 2023-07-24 ENCOUNTER — Other Ambulatory Visit: Payer: Self-pay | Admitting: Internal Medicine

## 2023-07-24 DIAGNOSIS — E1169 Type 2 diabetes mellitus with other specified complication: Secondary | ICD-10-CM

## 2023-07-25 ENCOUNTER — Other Ambulatory Visit: Payer: Self-pay | Admitting: Internal Medicine

## 2023-07-25 ENCOUNTER — Other Ambulatory Visit: Payer: Self-pay

## 2023-07-25 DIAGNOSIS — E1169 Type 2 diabetes mellitus with other specified complication: Secondary | ICD-10-CM

## 2023-07-25 MED ORDER — PRAVASTATIN SODIUM 20 MG PO TABS
20.0000 mg | ORAL_TABLET | ORAL | 1 refills | Status: DC
  Filled 2023-07-25: qty 36, 84d supply, fill #0

## 2023-07-25 MED ORDER — TRULICITY 0.75 MG/0.5ML ~~LOC~~ SOAJ
0.7500 mg | SUBCUTANEOUS | 3 refills | Status: DC
Start: 2023-07-25 — End: 2023-09-12
  Filled 2023-07-25: qty 2, 28d supply, fill #0
  Filled 2023-08-23: qty 2, 28d supply, fill #1

## 2023-07-25 NOTE — Telephone Encounter (Signed)
Copied from CRM [id number]. Topic: General - Other >> Jul 25, 2023 10:29 AM Everette C wrote: Reason for CRM: Medication Refill - Medication: Rx #: [id number]  Dulaglutide (TRULICITY) 0.75 MG/0.5ML SOPN [[id number]]   Rx #: [id number]  pravastatin (PRAVACHOL) 20 MG tablet [[id number]]    Has the patient contacted their pharmacy? Yes.   (Agent: If no, request that the patient contact the pharmacy for the refill. If patient does not wish to contact the pharmacy document the reason why and proceed with request.) (Agent: If yes, when and what did the pharmacy advise?)  Preferred Pharmacy (with phone number or street name): [facility] [address], [address] [place] [address] Phone: [phone] Fax: [phone] Hours: M-F 7:30a-6:00p   Has the patient been seen for an appointment in the last year OR does the patient have an upcoming appointment? Yes.    Agent: Please be advised that RX refills may take up to 3 business days. We ask that you follow-up with your pharmacy.

## 2023-07-25 NOTE — Telephone Encounter (Signed)
Requested Prescriptions  Pending Prescriptions Disp Refills   Dulaglutide (TRULICITY) 0.75 MG/0.5ML SOPN 2 mL 3    Sig: Inject 0.75 mg into the skin once a week.     Endocrinology:  Diabetes - GLP-1 Receptor Agonists Failed - 07/25/2023 10:57 AM      Failed - HBA1C is between 0 and 7.9 and within 180 days    HbA1c, POC (prediabetic range)  Date Value Ref Range Status  02/07/2020 6.3 5.7 - 6.4 % Final   HbA1c, POC (controlled diabetic range)  Date Value Ref Range Status  05/08/2023 8.0 (A) 0.0 - 7.0 % Final         Passed - Valid encounter within last 6 months    Recent Outpatient Visits           2 months ago Type 2 diabetes mellitus with obesity (HCC)   [place] [facility] Jonah Blue B, MD   7 months ago Diabetes mellitus type 2 in obese [facility])   [place] [facility] Jonah Blue B, MD   1 year ago Diabetes mellitus type 2 in obese [facility])   [place] [facility] Jonah Blue B, MD   1 year ago Elevated blood pressure reading in office without diagnosis of hypertension   [place] [facility] [place], [place] L, RPH-CPP   1 year ago Diabetes mellitus type 2 in obese [facility])   [place] [facility] Marcine Matar, MD       Future Appointments             In 1 month Marcine Matar, MD [facility]             pravastatin (PRAVACHOL) 20 MG tablet 36 tablet 1    Sig: Take 1 tablet (20 mg total) by mouth every Monday, Wednesday, and Friday.     Cardiovascular:  Antilipid - Statins Failed - 07/25/2023 10:57 AM      Failed - Lipid Panel in normal range within the last 12 months    Cholesterol, Total  Date Value Ref Range Status  05/08/2023 238 (H) 100 - 199 mg/dL Final   LDL Chol Calc (NIH)  Date Value Ref Range Status  05/08/2023 128 (H) 0 - 99 mg/dL Final   HDL  Date Value Ref Range  Status  05/08/2023 53 >39 mg/dL Final   Triglycerides  Date Value Ref Range Status  05/08/2023 320 (H) 0 - 149 mg/dL Final         Passed - Patient is not pregnant      Passed - Valid encounter within last 12 months    Recent Outpatient Visits           2 months ago Type 2 diabetes mellitus with obesity (HCC)   [place] [facility] Jonah Blue B, MD   7 months ago Diabetes mellitus type 2 in obese [facility])   [place] [facility] Jonah Blue B, MD   1 year ago Diabetes mellitus type 2 in obese [facility])   [place] [facility] Jonah Blue B, MD   1 year ago Elevated blood pressure reading in office without diagnosis of hypertension   [facility] [place], [place] L, RPH-CPP   1 year ago Diabetes mellitus type 2 in obese [facility])  [facility] Marcine Matar, MD       Future Appointments             In 1 month [name], MD [facility]

## 2023-07-26 ENCOUNTER — Other Ambulatory Visit: Payer: Self-pay

## 2023-08-10 ENCOUNTER — Other Ambulatory Visit: Payer: Self-pay

## 2023-08-23 ENCOUNTER — Other Ambulatory Visit: Payer: Self-pay

## 2023-09-12 ENCOUNTER — Ambulatory Visit: Payer: Self-pay | Attending: Internal Medicine | Admitting: Internal Medicine

## 2023-09-12 ENCOUNTER — Encounter: Payer: Self-pay | Admitting: Internal Medicine

## 2023-09-12 ENCOUNTER — Other Ambulatory Visit: Payer: Self-pay

## 2023-09-12 VITALS — BP 123/83 | HR 78 | Wt 183.0 lb

## 2023-09-12 DIAGNOSIS — E1169 Type 2 diabetes mellitus with other specified complication: Secondary | ICD-10-CM

## 2023-09-12 DIAGNOSIS — Z7985 Long-term (current) use of injectable non-insulin antidiabetic drugs: Secondary | ICD-10-CM

## 2023-09-12 DIAGNOSIS — Z23 Encounter for immunization: Secondary | ICD-10-CM

## 2023-09-12 DIAGNOSIS — E782 Mixed hyperlipidemia: Secondary | ICD-10-CM

## 2023-09-12 DIAGNOSIS — E119 Type 2 diabetes mellitus without complications: Secondary | ICD-10-CM

## 2023-09-12 DIAGNOSIS — R030 Elevated blood-pressure reading, without diagnosis of hypertension: Secondary | ICD-10-CM

## 2023-09-12 DIAGNOSIS — Z7984 Long term (current) use of oral hypoglycemic drugs: Secondary | ICD-10-CM

## 2023-09-12 DIAGNOSIS — Z1211 Encounter for screening for malignant neoplasm of colon: Secondary | ICD-10-CM

## 2023-09-12 DIAGNOSIS — K760 Fatty (change of) liver, not elsewhere classified: Secondary | ICD-10-CM

## 2023-09-12 DIAGNOSIS — R7989 Other specified abnormal findings of blood chemistry: Secondary | ICD-10-CM

## 2023-09-12 LAB — GLUCOSE, POCT (MANUAL RESULT ENTRY): POC Glucose: 199 mg/dL — AB (ref 70–99)

## 2023-09-12 LAB — POCT GLYCOSYLATED HEMOGLOBIN (HGB A1C): HbA1c, POC (controlled diabetic range): 8.4 % — AB (ref 0.0–7.0)

## 2023-09-12 MED ORDER — TRULICITY 1.5 MG/0.5ML ~~LOC~~ SOAJ
1.5000 mg | SUBCUTANEOUS | 6 refills | Status: DC
Start: 2023-09-12 — End: 2024-04-22
  Filled 2023-09-12: qty 2, 28d supply, fill #0
  Filled 2023-10-20: qty 2, 28d supply, fill #1
  Filled 2023-11-14: qty 2, 28d supply, fill #2
  Filled 2023-12-18: qty 2, 28d supply, fill #3
  Filled 2024-01-22: qty 2, 28d supply, fill #4
  Filled 2024-02-27: qty 2, 28d supply, fill #5
  Filled 2024-03-25: qty 2, 28d supply, fill #6

## 2023-09-12 MED ORDER — METFORMIN HCL 1000 MG PO TABS
ORAL_TABLET | ORAL | 1 refills | Status: DC
Start: 2023-09-12 — End: 2024-04-29
  Filled 2023-09-12 – 2023-11-14 (×2): qty 180, 90d supply, fill #0
  Filled 2024-02-15 (×2): qty 180, 90d supply, fill #1

## 2023-09-12 NOTE — Progress Notes (Signed)
Patient ID: Linda Delgado, female    DOB: Sep 10, 1971  MRN: [id number]  CC: chronic ds management   Subjective: Linda Delgado is a 52 y.o. female who presents for chronic ds management. Her concerns today include:  Pt with hx of NAFLD, obesity, DM, HL, anxiety.    AMN Language interpreter used during this encounter. #[id number], Lars Mage.   DM/NAFLD/obesity: Results for orders placed or performed in visit on 09/12/23  POCT glucose (manual entry)  Result Value Ref Range   POC Glucose 199 (A) 70 - 99 mg/dl  POCT glycosylated hemoglobin (Hb A1C)  Result Value Ref Range   Hemoglobin A1C     HbA1c POC (<> result, manual entry)     HbA1c, POC (prediabetic range)     HbA1c, POC (controlled diabetic range) 8.4 (A) 0.0 - 7.0 %  A1C increased from 8.0 four mths ago.   On last visit, we increase metformin to 1 g twice a day and continued the Trulicity 0.75 mg once a week.  Confirms taking consistently. Tolerating Trulicity.  No significant wgh loss since last visit Eating habits: feels she is taking care of self more - not much white stuff and sweets Just got a treadmill and plans to start exercising this wk Patient with history of HL and abnormal LFTs.  We had her hold Pravachol and AST/ALT still remains elevated.  We had her restart the Pravachol taking it 3 times a week.  Last AST/ALT had improved from 59/96 to 48/80.  Last fib 4 score of 1.32 putting her at low risk.  Referred to GI.  Has an appointment scheduled 10/25/2023.  BP elev today.  Does not use much salt in foods.  HM:  yes to flu shot.  Last eye exam was done 1 yr ago at [facility] and plans to get it done there again.  Due for FIT test Patient Active Problem List   Diagnosis Date Noted   Adhesive capsulitis of left shoulder 07/08/2022   Migraine without aura and without status migrainosus, not intractable 02/08/2022   Generalized anxiety disorder 06/08/2020   Elevated blood pressure reading  02/07/2020   Mixed hyperlipidemia 02/07/2020   Hyperlipidemia associated with type 2 diabetes mellitus (HCC) 06/05/2019   Controlled type 2 diabetes mellitus without complication, without long-term current use of insulin (HCC) 04/13/2018   Mastalgia in female 02/23/2017   NAFLD (nonalcoholic fatty liver disease) 32/20/2542   Class 1 obesity due to excess calories without serious comorbidity with body mass index (BMI) of 30.0 to 30.9 in adult 02/23/2017   Thyroid activity decreased 11/13/2014   Menorrhagia 04/02/2014     Current Outpatient Medications on File Prior to Visit  Medication Sig Dispense Refill   Blood Glucose Monitoring Suppl (TRUE METRIX METER) w/Device KIT 1 each by Does not apply route 3 (three) times daily. 1 kit 0   Multiple Vitamin (MULTIVITAMIN) tablet Take 1 tablet by mouth daily.     omeprazole (PRILOSEC) 20 MG capsule Take 1 capsule (20 mg total) by mouth 2 (two) times daily before a meal. 20 capsule 0   pravastatin (PRAVACHOL) 20 MG tablet Take 1 tablet (20 mg total) by mouth every Monday, Wednesday, and Friday. 36 tablet 1   No current facility-administered medications on file prior to visit.    Allergies  Allergen Reactions   Atorvastatin Other (See Comments)    Muscle pain    Social History   Socioeconomic History   Marital status: Married  Spouse name: Not on file   Number of children: 3   Years of education: Not on file   Highest education level: Bachelor's degree (e.g., BA, AB, BS)  Occupational History   Not on file  Tobacco Use   Smoking status: Never   Smokeless tobacco: Never  Vaping Use   Vaping status: Never Used  Substance and Sexual Activity   Alcohol use: Yes    Comment: rarely   Drug use: No   Sexual activity: Yes    Partners: Male    Birth control/protection: None  Other Topics Concern   Not on file  Social History Narrative   Patient doesn't like to talk on the phone   Social Determinants of Health   Financial Resource  Strain: Medium Risk (09/12/2023)   Overall Financial Resource Strain (CARDIA)    Difficulty of Paying Living Expenses: Somewhat hard  Food Insecurity: No Food Insecurity (09/12/2023)   Hunger Vital Sign    Worried About Running Out of Food in the Last Year: Never true    Ran Out of Food in the Last Year: Never true  Transportation Needs: No Transportation Needs (09/12/2023)   PRAPARE - Administrator, Civil Service (Medical): No    Lack of Transportation (Non-Medical): No  Physical Activity: Sufficiently Active (11/02/2017)   Exercise Vital Sign    Days of Exercise per Week: 4 days    Minutes of Exercise per Session: 60 min  Stress: No Stress Concern Present (09/12/2023)   [employer] of Occupational Health - Occupational Stress Questionnaire    Feeling of Stress : Not at all  Social Connections: Socially Integrated (09/12/2023)   Social Connection and Isolation Panel [NHANES]    Frequency of Communication with Friends and Family: Three times a week    Frequency of Social Gatherings with Friends and Family: Twice a week    Attends Religious Services: 1 to 4 times per year    Active Member of [employer] or Organizations: Yes    Attends Engineer, structural: More than 4 times per year    Marital Status: Married  Catering manager Violence: Not At Risk (11/02/2017)   Humiliation, Afraid, Rape, and Kick questionnaire    Fear of Current or Ex-Partner: No    Emotionally Abused: No    Physically Abused: No    Sexually Abused: No    Family History  Problem Relation Age of Onset   Diabetes Father    Hypertension Father    Stroke Father    Hypertension Mother    Cancer Paternal Grandmother        stomach   Breast cancer Paternal Aunt     Past Surgical History:  Procedure Laterality Date   CESAREAN SECTION  1997, 1999, 2007   WISDOM TOOTH EXTRACTION      ROS: Review of Systems Negative except as stated above  PHYSICAL EXAM: BP 123/83   Pulse 78   Wt 183 lb  (83 kg)   SpO2 99%   BMI 31.41 kg/m   Wt Readings from Last 3 Encounters:  09/12/23 183 lb (83 kg)  05/08/23 185 lb (83.9 kg)  12/08/22 183 lb (83 kg)    Physical Exam  General appearance - alert, well appearing, middle-age Hispanic female and in no distress Mental status - normal mood, behavior, speech, dress, motor activity, and thought processes Chest - clear to auscultation, no wheezes, rales or rhonchi, symmetric air entry Heart - normal rate, regular rhythm, normal S1, S2, no  murmurs, rubs, clicks or gallops Extremities - peripheral pulses normal, no pedal edema, no clubbing or cyanosis Diabetic Foot Exam - Simple   Simple Foot Form Diabetic Foot exam was performed with the following findings: Yes 09/12/2023 10:25 AM  Visual Inspection See comments: Yes Sensation Testing Intact to touch and monofilament testing bilaterally: Yes Pulse Check Posterior Tibialis and Dorsalis pulse intact bilaterally: Yes Comments Bunions on both big toes right larger than left and slightly inflamed.          Latest Ref Rng & Units 05/29/2023   11:15 AM 05/08/2023    4:27 PM 12/30/2022    8:57 AM  CMP  Total Protein 6.0 - 8.5 g/dL 7.1  7.5  7.1   Total Bilirubin 0.0 - 1.2 mg/dL 0.4  0.5  0.3   Alkaline Phos 44 - 121 IU/L 101  109  131   AST 0 - 40 IU/L 48  59  41   ALT 0 - 32 IU/L 80  96  68    Lipid Panel     Component Value Date/Time   CHOL 238 (H) 05/08/2023 1627   TRIG 320 (H) 05/08/2023 1627   HDL 53 05/08/2023 1627   CHOLHDL 4.5 (H) 05/08/2023 1627   CHOLHDL 3.8 02/24/2014 0900   VLDL 33 02/24/2014 0900   LDLCALC 128 (H) 05/08/2023 1627    CBC    Component Value Date/Time   WBC 8.5 12/08/2022 1646   WBC 6.0 11/13/2014 1137   RBC 4.62 12/08/2022 1646   RBC 4.23 11/13/2014 1137   HGB 13.8 12/08/2022 1646   HCT 40.4 12/08/2022 1646   PLT 241 12/08/2022 1646   MCV 87 12/08/2022 1646   MCH 29.9 12/08/2022 1646   MCH 28.6 11/13/2014 1137   MCHC 34.2 12/08/2022 1646    MCHC 34.0 11/13/2014 1137   RDW 12.3 12/08/2022 1646   LYMPHSABS 2.5 11/13/2014 1137   MONOABS 0.4 11/13/2014 1137   EOSABS 0.2 11/13/2014 1137   BASOSABS 0.0 11/13/2014 1137    ASSESSMENT AND PLAN: 1. Type 2 diabetes mellitus with other specified complication, without long-term current use of insulin (HCC) Not at goal. Increase Trulicity to 1.5 mg once a week.  Went over possible side effects including nausea, vomiting, pancreatitis, bowel blockage, palpitations, severe diarrhea/constipation.  Advised to stop the medicine and call if she develops any vomiting, pain in the abdomen with vomiting or pain in the abdomen and not able to pass her bowels.  She should also call if she develops any palpitations. Encourage healthy eating habits. Encouraged her to start using her treadmill with the goal of getting in at least 30 minutes of moderate intensity exercise 5 days a week. - POCT glucose (manual entry) - POCT glycosylated hemoglobin (Hb A1C) - metFORMIN (GLUCOPHAGE) 1000 MG tablet; Take 1 tablet (1,000 mg total) by mouth every morning AND 1 tablet (1,000 mg total) every evening.  Dispense: 180 tablet; Refill: 1 - Dulaglutide (TRULICITY) 1.5 MG/0.5ML SOAJ; Inject 1.5 mg into the skin once a week.  Dispense: 2 mL; Refill: 6  2. Long-term (current) use of injectable non-insulin antidiabetic drugs 3. Diabetes mellitus treated with oral medication [facility]) -Encourage patient to get her eye exam done soon as possible and send me a copy of her prescription  4. Fatty liver See #1 above.  Keep upcoming appointment with GI next month.  5. Abnormal LFTs Likely due to fatty liver.  Continue Pravachol 3 days a week. - Hepatic Function Panel  6. Mixed  hyperlipidemia See #5 above  7. Elevated blood pressure reading in office without diagnosis of hypertension Repeat blood pressure today is better.  DASH diet encouraged  8. Need for immunization against influenza Given today.  9. Screening for  colon cancer - Fecal occult blood, imunochemical([facility])       Patient was given the opportunity to ask questions.  Patient verbalized understanding of the plan and was able to repeat key elements of the plan.   This documentation was completed using Paediatric nurse.  Any transcriptional errors are unintentional.  Orders Placed This Encounter  Procedures   Fecal occult blood, imunochemical([facility])   Hepatic Function Panel   POCT glucose (manual entry)   POCT glycosylated hemoglobin (Hb A1C)     Requested Prescriptions   Signed Prescriptions Disp Refills   metFORMIN (GLUCOPHAGE) 1000 MG tablet 180 tablet 1    Sig: Take 1 tablet (1,000 mg total) by mouth every morning AND 1 tablet (1,000 mg total) every evening.   Dulaglutide (TRULICITY) 1.5 MG/0.5ML SOAJ 2 mL 6    Sig: Inject 1.5 mg into the skin once a week.    Return in about 4 months (around 01/11/2024).  Jonah Blue, MD, FACP

## 2023-09-12 NOTE — Patient Instructions (Addendum)
You have an appointment with [facility] on 10/24/2022  [address] [place], [place] [address] PH# [phone]   Increase Trulicity to 1.5 mg once a week.  Plan de alimentacin DASH DASH Eating Plan DASH es la sigla en ingls de "Enfoques alimentarios para detener la hipertensin" (Dietary Approaches to Stop Hypertension). El plan de alimentacin DASH ha demostrado: Bajar la presin arterial alta (hipertensin). Reducir el riesgo de diabetes tipo 2, enfermedad cardaca y accidente cerebrovascular. Ayudar a perder peso. Consejos para seguir Consulting civil engineer las etiquetas de los alimentos Verifique la cantidad de sal (sodio) por porcin en las etiquetas de los alimentos. Elija alimentos con menos del 5 por ciento del Licensed conveyancer diario (VD) de sodio. En general, los alimentos con menos de 300 miligramos (mg) de sodio por porcin se encuadran dentro de este plan alimentario. Para encontrar cereales integrales, busque la palabra "integral" como primera palabra en la lista de ingredientes. Al ir de compras Compre productos en los que en su etiqueta diga: "bajo contenido de sodio" o "sin sal agregada". Compre alimentos frescos. Evite los alimentos enlatados y comidas precocidas o congeladas. Al cocinar Trate de no agregar sal cuando cocine. Use hierbas o aderezos sin sal, en lugar de sal de mesa o sal marina. Consulte al mdico o farmacutico antes de usar sustitutos de la sal. No fra los alimentos. A la hora de cocinar los alimentos, opte por hornearlos, hervirlos, grillarlos, asarlos al horno o a la parrilla. Cocine con aceites que sean buenos para el corazn. Estos incluyen el aceite de [place], canola, [place], soja y [place]. Planificacin de las comidas  Siga una dieta equilibrada. Esto debe incluir lo siguiente: 4 o ms porciones de frutas y 4 o ms porciones de Warehouse manager. Trate de que medio plato de cada comida sea de frutas y verduras. De 6 a 8 porciones de [employer]. 6 o menos porciones de carne [place], ave o pescado por [employer]. 1 onza es 1 porcin. Una porcin de 3 onzas (85 g) de carne tiene casi el mismo tamao que la palma de la [place]. Un huevo es 1 onza (28 g). De 2 a 3 porciones de productos lcteos descremados por da. Una porcin es 1 taza (237 ml). 1 porcin de frutos secos, semillas o frijoles 5 veces por semana. De 2 a 3 porciones de grasas cardiosaludables. Las grasas saludables llamadas cidos grasos omega-3 se encuentran en alimentos como las nueces, las semillas de [place], las leches fortificadas y [place]. Estas grasas tambin se encuentran en los pescados de agua fra, como la sardina, el salmn y la caballa. Limite la cantidad que consume de: Alimentos enlatados o envasados. Alimentos con alto contenido de grasa trans, como alimentos fritos. Alimentos con alto contenido de grasa saturada, como carne con grasa. Postres y otros dulces, bebidas azucaradas y otros alimentos con azcar agregada. Productos lcteos enteros. No le agregue sal a los alimentos antes de probarlos. No coma ms de 4 yemas de huevo por semana. Trate de comer al menos 2 comidas vegetarianas por semana. Consuma ms comida casera y menos de restaurante, de bares y comida rpida. Estilo de vida Cuando coma en un restaurante, pida que preparen su comida con menos sal, o bien, sin nada de sal. Si bebe alcohol: Limite la cantidad que bebe a lo siguiente: De 0 a 1 medida al da si es [place]. De 0 a 2 medidas al da si es varn. Sepa cunta cantidad de alcohol hay en  las bebidas que toma. En los [address], una medida es una botella de cerveza de 12 oz (355 ml), un vaso de vino de 5 oz (148 ml) o un vaso de una bebida alcohlica de alta graduacin de 1 oz (44 ml). Informacin general Evite ingerir ms de 2,300 mg de sal por da. Si tiene hipertensin, es posible que necesite reducir la ingesta de sodio a 1,500 mg por da. Trabaje con el mdico para [employer]  en un peso saludable o para perder peso. Pregunte cul es el rango de peso recomendable para usted. La DIRECTV de la semana, realice al menos 30 minutos de ejercicio que haga que se acelere su corazn. Estos pueden incluir caminar, nadar o andar en bicicleta. Trabaje con su mdico o nutricionista para ajustar su plan alimentario a sus necesidades calricas especficas. Qu alimentos debo comer? Frutas Todas las frutas frescas, congeladas o secas. Frutas enlatadas que estn en su jugo natural y que no tengan azcar agregada. Verduras Verduras frescas o congeladas crudas, cocidas al vapor, asadas o grilladas. Jugos de tomate y verduras con bajo contenido de sodio o reducidos en sodio. Salsa y pasta de tomate con bajo contenido de sodio o reducidas en sodio. Verduras enlatadas con bajo contenido de sodio o reducidas en sodio. Granos Pan de salvado o integral. Pasta de salvado o integral. Arroz integral. Avena. Quinua. Trigo burgol. Cereales integrales y con bajo contenido de [place]. Pan pita. Galletitas de [place] con bajo contenido de [place] of [place] deg [place] y [place]. Tortillas de [place] integral. Carnes y otras protenas Pollo o pavo sin piel. Carne de pollo o de [place]. Cerdo desgrasado. Pescado y [employer]. Claras de huevo. Porotos, guisantes o lentejas secos. Frutos secos, mantequilla de frutos secos y semillas sin sal. Frijoles enlatados sin sal. Cortes de carne vacuna magra, desgrasada. Carne precocida o curada magra y baja en sodio, como embutidos o panes de carne. Lcteos Leche descremada (1 %) o descremada (desnatada). Quesos reducidos en grasa, con bajo contenido de grasa o descremados. Queso blanco o ricota sin grasa, con bajo contenido de [place]. Yogur semidescremado o descremado. Queso con bajo contenido de [place]) y [place]. Grasas y [employer] untables que no contengan grasas trans. Aceite vegetal. Jerolyn Shin y aderezos para ensaladas livianos, reducidos en grasa o con bajo contenido de grasas  (reducidos en sodio). Aceite de canola, crtamo, oliva, aguacate, soja y [place]. [place]. Alios y condimentos Hierbas. Especias. Mezclas de condimentos sin sal. Otros alimentos Palomitas de maz y pretzels sin sal. Dulces con bajo contenido de grasas. Es posible que los productos que se enumeran ms arriba no sean todos los alimentos y las bebidas que puede consumir. Consulte a un nutricionista para obtener ms informacin. Qu alimentos debo evitar? Nils Pyle Fruta enlatada en almbar liviano o espeso. Frutas cocidas en aceite. Frutas con salsa de crema o mantequilla. Verduras Verduras con crema o fritas. Verduras en salsa de [place]. Verduras enlatadas regulares que no sean con bajo contenido de sodio o reducidas en sodio. Pasta y salsa de tomates enlatadas regulares que no sean con bajo contenido de sodio o reducidas en sodio. Jugos de tomate y de verduras regulares que no sean con bajo contenido de sodio o reducidos en sodio. Pepinillos. Aceitunas. Granos Productos de panificacin hechos con grasa, como medialunas, magdalenas y algunos panes. Comidas con arroz o pasta seca listas para usar. Carnes y [address] de carne con alto contenido de Holiday representative. Costillas. Carne frita. [place]. [place], salame y otras carnes precocidas o curadas, como  embutidos o panes de carne, que no sean magros y que no sean con bajo contenido de [place]. Grasa de la espalda del cerdo (panceta). Salchicha de cerdo. Frutos secos y semillas con sal. Frijoles enlatados con sal agregada. Pescado enlatado o ahumado. Huevos enteros o yemas. Pollo o pavo con piel. Lcteos Leche entera o al 2 %, crema y [address] y mitad crema. Queso crema entero o con toda su grasa. Yogur entero o endulzado. Quesos con toda su grasa. Sustitutos de cremas no lcteas. Coberturas batidas. Quesos para untar y quesos procesados. Grasas y [employer]. Margarina en barra. Manteca de cerdo. Materia grasa. Mantequilla clarificada.  Grasa de tocino. Aceites tropicales como aceite de coco, palmiste o palma. Alios y condimentos Sal de cebolla, sal de ajo, sal condimentada, sal de mesa y sal marina. Salsa [place]. Salsa trtara. Salsa barbacoa. Salsa teriyaki. Salsa de soja, incluso la que tiene contenido reducido de [place]. Salsa de carne. Salsas en lata y envasadas. Salsa de pescado. Salsa de Lee. Salsa rosada. Rbanos picantes comprados en tiendas. [place]. Mostaza. Saborizantes y tiernizantes para carne. Caldo en cubitos. Salsas picantes. Adobos preelaborados o envasados. Aderezos para tacos preelaborados o envasados. Salsas. Aderezos comunes para ensalada. Otros alimentos Palomitas de maz y pretzels con sal. Es posible que los productos que se enumeran ms arriba no sean todos los alimentos y las bebidas que Personnel officer. Consulte a un nutricionista para obtener ms informacin. Dnde obtener ms informacin BJ's, Lung, and Blood Institute (Instituto Nacional del [place], los Pulmones y la [place], [place]): BuffaloDryCleaner.gl American Heart Association (Asociacin Estadounidense del [place], [place]): heart.org Academy of Nutrition and Dietetics (Academia de Nutricin y Pension scheme manager): eatright.org National Kidney Foundation (NKF) (Fundacin Nacional del Rin): kidney.org Esta informacin no tiene Theme park manager el consejo del mdico. Asegrese de hacerle al mdico cualquier pregunta que tenga. Document Revised: 11/04/2022 Document Reviewed: 11/04/2022 Elsevier Patient Education  2024 [employer].

## 2023-09-13 ENCOUNTER — Other Ambulatory Visit: Payer: Self-pay | Admitting: Internal Medicine

## 2023-09-13 LAB — HEPATIC FUNCTION PANEL
ALT: 121 [IU]/L — ABNORMAL HIGH (ref 0–32)
AST: 77 [IU]/L — ABNORMAL HIGH (ref 0–40)
Albumin: 4.8 g/dL (ref 3.8–4.9)
Alkaline Phosphatase: 122 [IU]/L — ABNORMAL HIGH (ref 44–121)
Bilirubin Total: 0.3 mg/dL (ref 0.0–1.2)
Bilirubin, Direct: 0.11 mg/dL (ref 0.00–0.40)
Total Protein: 7.4 g/dL (ref 6.0–8.5)

## 2023-09-13 NOTE — Progress Notes (Signed)
Let patient know that liver function tests are more elevated compared to when last checked in August.  Stop pravastatin.  We will have her stay off cholesterol-lowering medication in this class.  Keep upcoming appointment with the gastroenterologist.

## 2023-09-15 LAB — FECAL OCCULT BLOOD, IMMUNOCHEMICAL: Fecal Occult Bld: NEGATIVE

## 2023-10-20 ENCOUNTER — Other Ambulatory Visit: Payer: Self-pay | Admitting: Internal Medicine

## 2023-10-20 ENCOUNTER — Other Ambulatory Visit: Payer: Self-pay

## 2023-10-20 DIAGNOSIS — E119 Type 2 diabetes mellitus without complications: Secondary | ICD-10-CM

## 2023-10-22 MED ORDER — TRUEPLUS LANCETS 28G MISC
6 refills | Status: AC
  Filled 2023-10-22: qty 100, 50d supply, fill #0

## 2023-10-22 MED ORDER — TRUE METRIX BLOOD GLUCOSE TEST VI STRP
ORAL_STRIP | 6 refills | Status: AC
  Filled 2023-10-22: qty 100, 50d supply, fill #0

## 2023-10-23 ENCOUNTER — Other Ambulatory Visit: Payer: Self-pay

## 2023-10-25 ENCOUNTER — Encounter: Payer: Self-pay | Admitting: Gastroenterology

## 2023-10-25 ENCOUNTER — Other Ambulatory Visit (INDEPENDENT_AMBULATORY_CARE_PROVIDER_SITE_OTHER): Payer: Self-pay

## 2023-10-25 ENCOUNTER — Ambulatory Visit (INDEPENDENT_AMBULATORY_CARE_PROVIDER_SITE_OTHER): Payer: Self-pay | Admitting: Gastroenterology

## 2023-10-25 VITALS — BP 126/70 | HR 78 | Ht 64.0 in | Wt 183.0 lb

## 2023-10-25 DIAGNOSIS — R7989 Other specified abnormal findings of blood chemistry: Secondary | ICD-10-CM

## 2023-10-25 DIAGNOSIS — Z8619 Personal history of other infectious and parasitic diseases: Secondary | ICD-10-CM

## 2023-10-25 DIAGNOSIS — R1032 Left lower quadrant pain: Secondary | ICD-10-CM | POA: Insufficient documentation

## 2023-10-25 DIAGNOSIS — Z1211 Encounter for screening for malignant neoplasm of colon: Secondary | ICD-10-CM | POA: Insufficient documentation

## 2023-10-25 DIAGNOSIS — K760 Fatty (change of) liver, not elsewhere classified: Secondary | ICD-10-CM

## 2023-10-25 LAB — IBC + FERRITIN
Ferritin: 102.7 ng/mL (ref 10.0–291.0)
Iron: 84 ug/dL (ref 42–145)
Saturation Ratios: 19 % — ABNORMAL LOW (ref 20.0–50.0)
TIBC: 441 ug/dL (ref 250.0–450.0)
Transferrin: 315 mg/dL (ref 212.0–360.0)

## 2023-10-25 LAB — HEPATIC FUNCTION PANEL
ALT: 108 U/L — ABNORMAL HIGH (ref 0–35)
AST: 67 U/L — ABNORMAL HIGH (ref 0–37)
Albumin: 5 g/dL (ref 3.5–5.2)
Alkaline Phosphatase: 89 U/L (ref 39–117)
Bilirubin, Direct: 0.1 mg/dL (ref 0.0–0.3)
Total Bilirubin: 0.5 mg/dL (ref 0.2–1.2)
Total Protein: 7.9 g/dL (ref 6.0–8.3)

## 2023-10-25 NOTE — Progress Notes (Signed)
 10/25/2023 Linda Delgado [id number] Jul 04, 1971   HISTORY OF PRESENT ILLNESS: This is a 53 year old Hispanic Spanish-speaking female who is new to our office.  Was referred here by Dr. Lincoln Renshaw for evaluation of abnormal LFTs.  It appears that her LFTs have been elevated to some degree for the past few years.  Most recent LFTs in early December showed an alk phos of 122, AST 77, ALT 121.  Total bili is normal at 0.3.  Ultrasound showed fatty liver.  She is diabetic.  She had been on cholesterol medication as well, but they stopped that recently about 6 weeks ago to see if that was contributing to her elevated LFTs.  She says that her father had some type of liver issue, but unsure of diagnosis.  She said he was an alcohol drinker.  Patient does not drink alcohol.  Uses rare Tylenol .  No NSAIDs.  No other strange vitamins, supplements, minerals, drugs.  Hepatitis C testing is negative.  It appears that she was diagnosed with H. pylori in the spring 2024 and was treated for that.  Does not appear that any follow-up testing was performed to ensure eradication.  She has never had a colonoscopy in the past.  She had a negative fecal immunochemistry recently.  She would like to have a colonoscopy.  She says she moves her bowels regularly.  No rectal bleeding.  She does report intermittent burning discomfort in her left lower quadrant.  Patient is primarily Spanish-speaking so interpreter/translator was present during the entirety of the visit.   Past Medical History:  Diagnosis Date   Diabetes mellitus without complication (HCC)    Menorrhagia    Thyroid disease    Past Surgical History:  Procedure Laterality Date   CESAREAN SECTION  1997, 1999, 2007   WISDOM TOOTH EXTRACTION      reports that she has never smoked. She has never used smokeless tobacco. She reports that she does not currently use alcohol. She reports that she does not use drugs. family history includes Breast  cancer in her paternal aunt; Cancer in her paternal grandmother; Diabetes in her father; Hypertension in her father and mother; Stroke in her father. Allergies  Allergen Reactions   Atorvastatin  Other (See Comments)    Muscle pain      Outpatient Encounter Medications as of 10/25/2023  Medication Sig   Dulaglutide  (TRULICITY ) 1.5 MG/0.5ML SOAJ Inject 1.5 mg into the skin once a week.   glucose blood (TRUE METRIX BLOOD GLUCOSE TEST) test strip USE AS INSTRUCTED   metFORMIN  (GLUCOPHAGE ) 1000 MG tablet Take 1 tablet (1,000 mg total) by mouth every morning AND 1 tablet (1,000 mg total) every evening.   TRUEplus Lancets 28G MISC USE AS DIRECTED   Blood Glucose Monitoring Suppl (TRUE METRIX METER) w/Device KIT 1 each by Does not apply route 3 (three) times daily. (Patient not taking: Reported on 10/25/2023)   Multiple Vitamin (MULTIVITAMIN) tablet Take 1 tablet by mouth daily. (Patient not taking: Reported on 10/25/2023)   omeprazole  (PRILOSEC) 20 MG capsule Take 1 capsule (20 mg total) by mouth 2 (two) times daily before a meal. (Patient not taking: Reported on 10/25/2023)   No facility-administered encounter medications on file as of 10/25/2023.    REVIEW OF SYSTEMS  : All other systems reviewed and negative except where noted in the History of Present Illness.   PHYSICAL EXAM: BP 126/70   Pulse 78   Ht 5\' 4"  (1.626 m)   Wt 183 lb (83  kg)   BMI 31.41 kg/m  General: Well developed Hispanic female in no acute distress Head: Normocephalic and atraumatic Eyes:  Sclerae anicteric, conjunctiva pink. Ears: Normal auditory acuity Lungs: Clear throughout to auscultation; no W/R/R. Heart: Regular rate and rhythm; no M/R/G. Abdomen: Soft, non-distended.  BS present.  Minimal LLQ TTP. Rectal:  Will be done at the time of colonoscopy. Musculoskeletal: Symmetrical with no gross deformities  Skin: No lesions on visible extremities Neurological: Alert oriented x 4, grossly non-focal. Psychological:   Alert and cooperative. Normal mood and affect  ASSESSMENT AND PLAN: *Elevated LFTs: She does have fatty liver by ultrasound.  Has history of AST and ALT mildly elevated for least the past few years.  No alcohol use.  No frequent Tylenol  or NSAID use.  Reports father had some type of liver issue, but unsure of diagnosis, but he was an alcohol drinker.  She has been off of her statin about 6 weeks in case that was contributing.  Will repeat LFTs today at those are unchanged them she can likely restart her statin as that is an important component of treating fatty liver disease as well.  Will perform other liver serologies to rule out other causes of chronic liver disease.  We discussed good diet, exercise, weight loss, blood sugar and cholesterol control, etc. *H. Pylori: Looks like she was treated for H. pylori by her PCP back in the spring 2024.  No follow-up stool for eradication was performed.  Will check a stool Diatherix. *Colon cancer screening: Never had a colonoscopy in the past.  She would like to proceed.  Will schedule Dr. Willy Harvest.  The risks, benefits, and alternatives to colonoscopy were discussed with the patient and she consents to proceed.  *Intermittent left lower quadrant abdominal pain: Described as intermittent burning discomfort.  Regular bowel movements.  CC:  Lawrance Presume, MD

## 2023-10-25 NOTE — Patient Instructions (Addendum)
 Su proveedor le ha solicitado que vaya al stano para Education officer, environmental anlisis de laboratorio antes de irse hoy. Presione "B" en el ascensor. El laboratorio est ubicado en la primera puerta a la izquierda al salir del Medical sales representative.  Su proveedor le ha ordenado una prueba de heces con "Diatherix". Usted recibi [employer] un kit de nuestra oficina que contiene todos los suministros necesarios para completar esta prueba. Lea atentamente las instrucciones de recoleccin de heces proporcionadas en el kit antes de abrir los materiales que lo acompaan. Adems, asegrese de Chemical engineer de la esquina superior derecha de la hoja de solicitud del laboratorio en el tubo "puritan opti-swab" que se suministra con Probation officer. Esta etiqueta debe incluir su nombre completo y fecha de nacimiento. Despus de completar la prueba, debe asegurar el tubo Purtian en la bolsa para muestras de riesgo biolgico. La hoja de informacin de solicitud del laboratorio (que incluye la fecha y hora de la recoleccin de la Leisure centre manager) debe colocarse en el bolsillo exterior de la bolsa de muestras de riesgo biolgico y Database administrator al laboratorio de Adult nurse dentro de los 2 das de Financial trader. _____________________________________________________________________  Your provider has requested that you go to the basement level for lab work before leaving today. Press "B" on the elevator. The lab is located at the first door on the left as you exit the elevator.  Your provider has ordered "Diatherix" stool testing for you. You have received a kit from our office today containing all necessary supplies to complete this test. Please carefully read the stool collection instructions provided in the kit before opening the accompanying materials. In addition, be sure to place the label from the top right corner of the laboratory request sheet onto the "puritan opti-swab" tube that is supplied in the kit. This label should include your full name and date of birth.  After completing the test, you should secure the purtian tube into the specimen biohazard bag. The laboratory request information sheet (including date and time of specimen collection) should be placed into the outside pocket of the specimen biohazard bag and returned to the [facility] with 2 days of collection.   If the laboratory information sheet specimen date and time are not filled out, the test will NOT be performed.  You have been scheduled for a colonoscopy. Please follow written instructions given to you at your visit today.   Please pick up your prep supplies at the pharmacy within the next 1-3 days.  If you use inhalers (even only as needed), please bring them with you on the day of your procedure.  DO NOT TAKE 7 DAYS PRIOR TO TEST- Trulicity  (dulaglutide ) Ozempic, Wegovy (semaglutide) Mounjaro (tirzepatide) Bydureon Bcise (exanatide extended release)  DO NOT TAKE 1 DAY PRIOR TO YOUR TEST Rybelsus (semaglutide) Adlyxin (lixisenatide) Victoza (liraglutide) Byetta (exanatide) ___________________________________________________________  _______________________________________________________  If your blood pressure at your visit was 140/90 or greater, please contact your primary care physician to follow up on this.  _______________________________________________________  If you are age 54 or older, your body mass index should be between 23-30. Your Body mass index is 31.41 kg/m. If this is out of the aforementioned range listed, please consider follow up with your Primary Care Provider.  If you are age 86 or younger, your body mass index should be between 19-25. Your Body mass index is 31.41 kg/m. If this is out of the aformentioned range listed, please consider follow up with your Primary Care Provider.   ________________________________________________________  The [facility]  providers would like to encourage you to use MYCHART to communicate with providers for  non-urgent requests or questions.  Due to long hold times on the telephone, sending your provider a message by [facility] may be a faster and more efficient way to get a response.  Please allow 48 business hours for a response.  Please remember that this is for non-urgent requests.  _______________________________________________________

## 2023-10-29 LAB — IGA: Immunoglobulin A: 192 mg/dL (ref 47–310)

## 2023-10-29 LAB — HEPATITIS B CORE ANTIBODY, TOTAL: Hep B Core Total Ab: NONREACTIVE

## 2023-10-29 LAB — IGG: IgG (Immunoglobin G), Serum: 1186 mg/dL (ref 600–1640)

## 2023-10-29 LAB — ANTI-SMOOTH MUSCLE ANTIBODY, IGG: Actin (Smooth Muscle) Antibody (IGG): <20 U (ref <20)

## 2023-10-29 LAB — ANA: Anti Nuclear Antibody (ANA): NEGATIVE

## 2023-10-29 LAB — HEPATITIS B SURFACE ANTIGEN: Hepatitis B Surface Ag: NONREACTIVE

## 2023-10-29 LAB — TISSUE TRANSGLUTAMINASE ABS,IGG,IGA
(tTG) Ab, IgA: <1 U/mL
(tTG) Ab, IgG: <1 U/mL

## 2023-10-29 LAB — CERULOPLASMIN: Ceruloplasmin: 26 mg/dL (ref 14–48)

## 2023-10-29 LAB — ALPHA-1-ANTITRYPSIN: A-1 Antitrypsin, Ser: 135 mg/dL (ref 83–199)

## 2023-10-29 LAB — HEPATITIS B SURFACE ANTIBODY,QUALITATIVE: Hep B S Ab: REACTIVE — AB

## 2023-10-29 LAB — MITOCHONDRIAL ANTIBODIES: Mitochondrial M2 Ab, IgG: <=20 U (ref <=20.0)

## 2023-10-29 LAB — HEPATITIS A ANTIBODY, TOTAL: Hepatitis A AB,Total: REACTIVE — AB

## 2023-11-01 ENCOUNTER — Telehealth: Payer: Self-pay | Admitting: Gastroenterology

## 2023-11-01 NOTE — Telephone Encounter (Signed)
Patient is calling to inquire about her lab result. Please advise. Patient is needing a interpreter.

## 2023-11-01 NOTE — Telephone Encounter (Signed)
See results note. 

## 2023-11-02 ENCOUNTER — Other Ambulatory Visit: Payer: Self-pay

## 2023-11-05 ENCOUNTER — Encounter: Payer: Self-pay | Admitting: Certified Registered Nurse Anesthetist

## 2023-11-07 ENCOUNTER — Telehealth: Payer: Self-pay | Admitting: Gastroenterology

## 2023-11-07 NOTE — Telephone Encounter (Signed)
Inbound call from patient's son stating patient took diabetic medication on Sunday 1/26. Son is requesting a call back to discuss how to proceed with 1/30 colonoscopy. Call back number is [phone]. Please advise, thank you.

## 2023-11-07 NOTE — Telephone Encounter (Signed)
Patient's son indicates that his mother has not been adhering to the low fiber diet needed 5 days prior to her scheduled 11/09/23 colonoscopy procedure. He also states that she took Trulicity on 11/05/23 as she forgot that she was to hold this 7 days prior to her procedure. I advised that we should go ahead and reschedule her procedure based on information given about Trulicity. Appointment has been changed to 12/12/23, 3 pm arrival. Both patient and son verbalize understanding of this date/time change and need to adjust medications in the future for this procedure.

## 2023-11-07 NOTE — Telephone Encounter (Signed)
Patient f/u on previous note. Please advise.

## 2023-11-09 ENCOUNTER — Encounter: Payer: Self-pay | Admitting: Internal Medicine

## 2023-11-14 ENCOUNTER — Other Ambulatory Visit: Payer: Self-pay

## 2023-11-23 ENCOUNTER — Telehealth: Payer: Self-pay | Admitting: Internal Medicine

## 2023-11-23 NOTE — Telephone Encounter (Signed)
Contacted pt left vm and sent out text message to resch appt

## 2023-12-12 ENCOUNTER — Encounter: Payer: Self-pay | Admitting: Internal Medicine

## 2023-12-18 ENCOUNTER — Other Ambulatory Visit: Payer: Self-pay

## 2023-12-19 ENCOUNTER — Other Ambulatory Visit: Payer: Self-pay

## 2024-01-05 ENCOUNTER — Ambulatory Visit: Payer: Self-pay

## 2024-01-05 NOTE — Telephone Encounter (Signed)
 Copied from CRM [id number]. Topic: Clinical - Red Word Triage >> Jan 05, 2024  3:57 PM Gery Pray wrote: Red Word that prompted transfer to Nurse Triage: Patient has been having high glucose levels of 195-196 for the past 2 weeks. Patient has not been able to get her levels to go down. Patient has been experiencing some dizziness as well.  Chief Complaint: elevated bgl Symptoms: dizzy  Frequency: comes and goes; was diagnosed with covid Pertinent Negatives: Patient denies cp, sob, n/v Disposition: [] ED /[] Urgent Care (no appt availability in office) / [] Appointment(In office/virtual)/ []  [facility]/ [] Home Care/ [x] Refused Recommended Disposition /[] [facility]/ []  Follow-up with PCP Additional Notes: home care advice given, denies questions; instructed to go to ER if becomes worse.  Reason for Disposition  Blood glucose 70-240 mg/dL (3.9 -04.5 mmol/L)  Answer Assessment - Initial Assessment Questions 1. BLOOD GLUCOSE: "What is your blood glucose level?"      195-196 2. ONSET: "When did you check the blood glucose?"     Past 2 weeks 3. USUAL RANGE: "What is your glucose level usually?" (e.g., usual fasting morning value, usual evening value)     150-160 4. KETONES: "Do you check for ketones (urine or blood test strips)?" If Yes, ask: "What does the test show now?"      denies 5. TYPE 1 or 2:  "Do you know what type of diabetes you have?"  (e.g., Type 1, Type 2, Gestational; doesn't know)      Type 2 6. INSULIN: "Do you take insulin?" "What type of insulin(s) do you use? What is the mode of delivery? (syringe, pen; injection or pump)?"      yes 7. DIABETES PILLS: "Do you take any pills for your diabetes?" If Yes, ask: "Have you missed taking any pills recently?"     no 8. OTHER SYMPTOMS: "Do you have any symptoms?" (e.g., fever, frequent urination, difficulty breathing, dizziness, weakness, vomiting)     dizziness 9. PREGNANCY: "Is there any chance you are pregnant?"  "When was your last menstrual period?"     na  Protocols used: Diabetes - High Blood Sugar-A-AH

## 2024-01-11 ENCOUNTER — Ambulatory Visit: Payer: Self-pay | Admitting: Internal Medicine

## 2024-01-22 ENCOUNTER — Other Ambulatory Visit: Payer: Self-pay

## 2024-01-25 ENCOUNTER — Ambulatory Visit: Payer: Self-pay | Admitting: Internal Medicine

## 2024-01-26 ENCOUNTER — Other Ambulatory Visit: Payer: Self-pay

## 2024-02-15 ENCOUNTER — Other Ambulatory Visit: Payer: Self-pay

## 2024-02-27 ENCOUNTER — Other Ambulatory Visit: Payer: Self-pay

## 2024-02-27 ENCOUNTER — Ambulatory Visit: Payer: Self-pay | Attending: Internal Medicine | Admitting: Internal Medicine

## 2024-02-28 ENCOUNTER — Other Ambulatory Visit: Payer: Self-pay

## 2024-03-25 ENCOUNTER — Other Ambulatory Visit: Payer: Self-pay

## 2024-04-03 ENCOUNTER — Other Ambulatory Visit: Payer: Self-pay

## 2024-04-22 ENCOUNTER — Other Ambulatory Visit: Payer: Self-pay

## 2024-04-22 ENCOUNTER — Other Ambulatory Visit: Payer: Self-pay | Admitting: Internal Medicine

## 2024-04-22 DIAGNOSIS — E1169 Type 2 diabetes mellitus with other specified complication: Secondary | ICD-10-CM

## 2024-04-22 MED ORDER — TRULICITY 1.5 MG/0.5ML ~~LOC~~ SOAJ
1.5000 mg | SUBCUTANEOUS | 6 refills | Status: DC
  Filled 2024-04-22: qty 2, 28d supply, fill #0

## 2024-04-25 ENCOUNTER — Telehealth: Payer: Self-pay | Admitting: Internal Medicine

## 2024-04-25 NOTE — Telephone Encounter (Signed)
 Called patient to confirm upcoming appointment 04/29/2024 at 10:30 am with Dr. Vicci. Patient appointment has been successfully confirmed.

## 2024-04-29 ENCOUNTER — Encounter: Payer: Self-pay | Admitting: Internal Medicine

## 2024-04-29 ENCOUNTER — Other Ambulatory Visit: Payer: Self-pay

## 2024-04-29 ENCOUNTER — Ambulatory Visit: Payer: Self-pay | Attending: Internal Medicine | Admitting: Internal Medicine

## 2024-04-29 VITALS — BP 123/79 | HR 69 | Wt 182.2 lb

## 2024-04-29 DIAGNOSIS — E785 Hyperlipidemia, unspecified: Secondary | ICD-10-CM

## 2024-04-29 DIAGNOSIS — K760 Fatty (change of) liver, not elsewhere classified: Secondary | ICD-10-CM

## 2024-04-29 DIAGNOSIS — Z7985 Long-term (current) use of injectable non-insulin antidiabetic drugs: Secondary | ICD-10-CM

## 2024-04-29 DIAGNOSIS — E1169 Type 2 diabetes mellitus with other specified complication: Secondary | ICD-10-CM

## 2024-04-29 DIAGNOSIS — E119 Type 2 diabetes mellitus without complications: Secondary | ICD-10-CM

## 2024-04-29 DIAGNOSIS — Z1231 Encounter for screening mammogram for malignant neoplasm of breast: Secondary | ICD-10-CM

## 2024-04-29 DIAGNOSIS — Z7984 Long term (current) use of oral hypoglycemic drugs: Secondary | ICD-10-CM

## 2024-04-29 LAB — POCT GLYCOSYLATED HEMOGLOBIN (HGB A1C): HbA1c, POC (controlled diabetic range): 8.6 % — AB (ref 0.0–7.0)

## 2024-04-29 LAB — GLUCOSE, POCT (MANUAL RESULT ENTRY): POC Glucose: 219 mg/dL — AB (ref 70–99)

## 2024-04-29 MED ORDER — SEMAGLUTIDE(0.25 OR 0.5MG/DOS) 2 MG/3ML ~~LOC~~ SOPN
0.2500 mg | PEN_INJECTOR | SUBCUTANEOUS | 0 refills | Status: DC
  Filled 2024-04-29: qty 3, 56d supply, fill #0

## 2024-04-29 MED ORDER — METFORMIN HCL 1000 MG PO TABS
ORAL_TABLET | ORAL | 1 refills | Status: DC
  Filled 2024-04-29 – 2024-06-04 (×2): qty 180, 90d supply, fill #0
  Filled 2024-09-06: qty 180, 90d supply, fill #1

## 2024-04-29 NOTE — Progress Notes (Signed)
 Patient ID: Linda Delgado, female    DOB: July 13, 1971  MRN: [id number]  CC: Medical Management of Chronic Issues   Subjective: Linda Delgado is a 53 y.o. female who presents for chronic ds management. Husband is with her. Her concerns today include:  Pt with hx of NAFLD, obesity, DM, HL, anxiety.    [employer] Language interpreter used during this encounter. #Doris, [phone]   Discussed the use of AI scribe software for clinical note transcription with the patient, who gave verbal consent to proceed.  History of Present Illness Linda Delgado is a 53 year old female with diabetes, hyperlipidemia, and MASLD who presents for follow-up.  DM: Results for orders placed or performed in visit on 04/29/24  POCT glucose (manual entry)   Collection Time: 04/29/24 10:43 AM  Result Value Ref Range   POC Glucose 219 (A) 70 - 99 mg/dl  POCT glycosylated hemoglobin (Hb A1C)   Collection Time: 04/29/24 10:59 AM  Result Value Ref Range   Hemoglobin A1C     HbA1c POC (<> result, manual entry)     HbA1c, POC (prediabetic range)     HbA1c, POC (controlled diabetic range) 8.6 (A) 0.0 - 7.0 %  Her current A1c is 8.6, with a goal of less than 7. She is on Trulicity  1.5 mg once a week and metformin  1000 mg twice a day. She missed her Trulicity  dose this week due to pharmacy issues but has been consistent with metformin . She checks her blood sugar three to four times a week in the mornings, with readings between 140 and 150, though it was 219 this morning. She attributes the high reading to dietary indiscretions over the weekend. She feels that Trulicity  has decreased her appetite. She engages in light exercise, walking and using a stationary bike three times a week.  HL/MASLD: she is not currently on pravastatin .  She did see the gastroenterologist PA earlier this year and workup pointed to MASLD.  She recommended that patient restart statin therapy as this is part of the  management.  BP noted to be elevated today on first check.  She tries to limit salt intake in her diet.  HM: Due for diabetic eye exam, urine microalbumin and mammogram.    Patient Active Problem List   Diagnosis Date Noted   History of Helicobacter pylori infection 10/25/2023   LLQ abdominal pain 10/25/2023   Elevated LFTs 10/25/2023   Colon cancer screening 10/25/2023   Adhesive capsulitis of left shoulder 07/08/2022   Migraine without aura and without status migrainosus, not intractable 02/08/2022   Generalized anxiety disorder 06/08/2020   Elevated blood pressure reading 02/07/2020   Mixed hyperlipidemia 02/07/2020   Hyperlipidemia associated with type 2 diabetes mellitus (HCC) 06/05/2019   Controlled type 2 diabetes mellitus without complication, without long-term current use of insulin  (HCC) 04/13/2018   Mastalgia in female 02/23/2017   NAFLD (nonalcoholic fatty liver disease) 94/82/7981   Class 1 obesity due to excess calories without serious comorbidity with body mass index (BMI) of 30.0 to 30.9 in adult 02/23/2017   Thyroid activity decreased 11/13/2014   Menorrhagia 04/02/2014     Current Outpatient Medications on File Prior to Visit  Medication Sig Dispense Refill   Multiple Vitamin (MULTIVITAMIN) tablet Take 1 tablet by mouth daily.     Blood Glucose Monitoring Suppl (TRUE METRIX METER) w/Device KIT 1 each by Does not apply route 3 (three) times daily. (Patient not taking: Reported on 10/25/2023) 1 kit 0  glucose blood (TRUE METRIX BLOOD GLUCOSE TEST) test strip USE AS INSTRUCTED 100 strip 6   TRUEplus Lancets 28G MISC USE AS DIRECTED 100 each 6   No current facility-administered medications on file prior to visit.    Allergies  Allergen Reactions   Atorvastatin  Other (See Comments)    Muscle pain    Social History   Socioeconomic History   Marital status: Married    Spouse name: Not on file   Number of children: 3   Years of education: Not on file    Highest education level: Bachelor's degree (e.g., BA, AB, BS)  Occupational History   Occupation: house wife  Tobacco Use   Smoking status: Never   Smokeless tobacco: Never  Vaping Use   Vaping status: Never Used  Substance and Sexual Activity   Alcohol use: Not Currently    Comment: rarely   Drug use: No   Sexual activity: Yes    Partners: Male    Birth control/protection: None  Other Topics Concern   Not on file  Social History Narrative   Patient doesn't like to talk on the phone   Social Drivers of Health   Financial Resource Strain: Medium Risk (09/12/2023)   Overall Financial Resource Strain (CARDIA)    Difficulty of Paying Living Expenses: Somewhat hard  Food Insecurity: No Food Insecurity (09/12/2023)   Hunger Vital Sign    Worried About Running Out of Food in the Last Year: Never true    Ran Out of Food in the Last Year: Never true  Transportation Needs: No Transportation Needs (09/12/2023)   PRAPARE - Administrator, Civil Service (Medical): No    Lack of Transportation (Non-Medical): No  Physical Activity: Sufficiently Active (11/02/2017)   Exercise Vital Sign    Days of Exercise per Week: 4 days    Minutes of Exercise per Session: 60 min  Stress: No Stress Concern Present (09/12/2023)   [employer] of Occupational Health - Occupational Stress Questionnaire    Feeling of Stress : Not at all  Social Connections: Socially Integrated (09/12/2023)   Social Connection and Isolation Panel    Frequency of Communication with Friends and Family: Three times a week    Frequency of Social Gatherings with Friends and Family: Twice a week    Attends Religious Services: 1 to 4 times per year    Active Member of [employer] or Organizations: Yes    Attends Engineer, structural: More than 4 times per year    Marital Status: Married  Catering manager Violence: Not At Risk (11/02/2017)   Humiliation, Afraid, Rape, and Kick questionnaire    Fear of Current or  Ex-Partner: No    Emotionally Abused: No    Physically Abused: No    Sexually Abused: No    Family History  Problem Relation Age of Onset   Hypertension Mother    Diabetes Father    Hypertension Father    Stroke Father    Cancer Paternal Grandmother        stomach   Breast cancer Paternal Aunt    Colon cancer Neg Hx    Stomach cancer Neg Hx    Esophageal cancer Neg Hx     Past Surgical History:  Procedure Laterality Date   CESAREAN SECTION  1997, 1999, 2007   WISDOM TOOTH EXTRACTION      ROS: Review of Systems Negative except as stated above  PHYSICAL EXAM: BP 123/79   Pulse 69   Wt  182 lb 3.2 oz (82.6 kg)   SpO2 98%   BMI 31.27 kg/m   Wt Readings from Last 3 Encounters:  04/29/24 182 lb 3.2 oz (82.6 kg)  10/25/23 183 lb (83 kg)  09/12/23 183 lb (83 kg)    Physical Exam  General appearance - alert, well appearing, middle-aged Hispanic female and in no distress Mental status - normal mood, behavior, speech, dress, motor activity, and thought processes Neck - supple, no significant adenopathy Chest - clear to auscultation, no wheezes, rales or rhonchi, symmetric air entry Heart - normal rate, regular rhythm, normal S1, S2, no murmurs, rubs, clicks or gallops Extremities - peripheral pulses normal, no pedal edema, no clubbing or cyanosis      Latest Ref Rng & Units 10/25/2023   11:34 AM 09/12/2023   10:39 AM 05/29/2023   11:15 AM  CMP  Total Protein 6.0 - 8.3 g/dL 7.9  7.4  7.1   Total Bilirubin 0.2 - 1.2 mg/dL 0.5  0.3  0.4   Alkaline Phos 39 - 117 U/L 89  122  101   AST 0 - 37 U/L 67  77  48   ALT 0 - 35 U/L 108  121  80    Lipid Panel     Component Value Date/Time   CHOL 238 (H) 05/08/2023 1627   TRIG 320 (H) 05/08/2023 1627   HDL 53 05/08/2023 1627   CHOLHDL 4.5 (H) 05/08/2023 1627   CHOLHDL 3.8 02/24/2014 0900   VLDL 33 02/24/2014 0900   LDLCALC 128 (H) 05/08/2023 1627    CBC    Component Value Date/Time   WBC 8.5 12/08/2022 1646   WBC  6.0 11/13/2014 1137   RBC 4.62 12/08/2022 1646   RBC 4.23 11/13/2014 1137   HGB 13.8 12/08/2022 1646   HCT 40.4 12/08/2022 1646   PLT 241 12/08/2022 1646   MCV 87 12/08/2022 1646   MCH 29.9 12/08/2022 1646   MCH 28.6 11/13/2014 1137   MCHC 34.2 12/08/2022 1646   MCHC 34.0 11/13/2014 1137   RDW 12.3 12/08/2022 1646   LYMPHSABS 2.5 11/13/2014 1137   MONOABS 0.4 11/13/2014 1137   EOSABS 0.2 11/13/2014 1137   BASOSABS 0.0 11/13/2014 1137    ASSESSMENT AND PLAN: 1. Type 2 diabetes mellitus with other specified complication, without long-term current use of insulin  (HCC) (Primary) Not at goal.  She is tolerating Trulicity  but has not had much success with it lowering her A1c or achieving any weight loss.  We discussed changing her to Ozempic .  Advised that the medication works the same way as Trulicity  but brings about greater weight loss.  I went over with pt how the medication works and potential side effects including nausea, vomiting, diarrhea/constipation, bowel blockage, palpitations and pancreatitis.  Advised to stop the medicine and be seen if pt develops any abdominal pain, vomiting, severe diarrhea/constipation or palpitations. -She is willing to make the switch to Ozempic .  Will start at the 0.25 mg once a week.  Continue metformin  1 g twice a day.  Will have her follow-up with the clinical pharmacist in 1 month for titration of the Ozempic . - POCT glycosylated hemoglobin (Hb A1C) - POCT glucose (manual entry) - metFORMIN  (GLUCOPHAGE ) 1000 MG tablet; Take 1 tablet (1,000 mg total) by mouth every morning AND 1 tablet (1,000 mg total) every evening.  Dispense: 180 tablet; Refill: 1 - Microalbumin / creatinine urine ratio - Semaglutide ,0.25 or 0.5MG /DOS, 2 MG/3ML SOPN; Inject 0.25 mg into the skin once  a week.  Dispense: 3 mL; Refill: 0  2. Long-term (current) use of injectable non-insulin  antidiabetic drugs 3. Diabetes mellitus treated with oral medication (HCC) See #1 above.  4.  Hyperlipidemia associated with type 2 diabetes mellitus (HCC) We will recheck lipid profile today along with LFTs and make a decision about putting her back on statin therapy.  Ideally she should be on statin therapy given MASLD - Lipid panel - Comprehensive metabolic panel with GFR  5. Metabolic dysfunction-associated steatotic liver disease (MASLD) Discussed the importance of weight loss and cholesterol and diabetes control.  I think Ozempic  will help with this. - Comprehensive metabolic panel with GFR  6. Encounter for screening mammogram for malignant neoplasm of breast Given mammogram scholarship. - MM 3D SCREENING MAMMOGRAM BILATERAL BREAST; Future  Patient was given the opportunity to ask questions.  Patient verbalized understanding of the plan and was able to repeat key elements of the plan.   This documentation was completed using Paediatric nurse.  Any transcriptional errors are unintentional.  Orders Placed This Encounter  Procedures   MM 3D SCREENING MAMMOGRAM BILATERAL BREAST   Microalbumin / creatinine urine ratio   Lipid panel   Comprehensive metabolic panel with GFR   POCT glycosylated hemoglobin (Hb A1C)   POCT glucose (manual entry)     Requested Prescriptions   Signed Prescriptions Disp Refills   metFORMIN  (GLUCOPHAGE ) 1000 MG tablet 180 tablet 1    Sig: Take 1 tablet (1,000 mg total) by mouth every morning AND 1 tablet (1,000 mg total) every evening.   Semaglutide ,0.25 or 0.5MG /DOS, 2 MG/3ML SOPN 3 mL 0    Sig: Inject 0.25 mg into the skin once a week.    Return in about 4 months (around 08/30/2024) for 4 weeks with clinical pharmacist for DM.  Barnie Louder, MD, FACP

## 2024-04-29 NOTE — Patient Instructions (Signed)
 VISIT SUMMARY:  Today, we discussed your diabetes, high blood pressure, and high cholesterol. We reviewed your current medications and made some changes to help better manage your conditions. We also talked about some upcoming tests and screenings that you need to complete.  YOUR PLAN:  -TYPE 2 DIABETES MELLITUS: Your A1c level is currently 8.6%, which is above the target of less than 7%. We are switching your medication from Trulicity  to Ozempic  to help better control your blood sugar and assist with weight loss. Ozempic  is taken once a week, and the pharmacy will show you how to use the pen. Start with 0.25 mg weekly and increase to 0.5 mg after one month if you tolerate it well. Continue to check your blood sugar regularly and maintain your dietary control.  -HYPERTENSION: Your blood pressure is currently 123/79 mmHg, which is within the goal range for diabetic patients. Continue to limit your salt intake to help manage your blood pressure.  -HYPERLIPIDEMIA: You have high cholesterol and previous tests showed liver inflammation. We will recheck your cholesterol and liver function with lab tests. Depending on the results, we may restart your pravastatin  medication to help manage your cholesterol levels.  -GENERAL HEALTH MAINTENANCE: You are due for your annual urine protein screening and a mammogram. We will collect a urine sample for the protein screening, and you will receive a scholarship for your mammogram screening.  INSTRUCTIONS:  Please follow up with the lab tests for cholesterol, kidney, and liver function. Start using Ozempic  as directed and monitor your blood sugar levels. Continue to limit your salt intake. Complete the urine protein screening and schedule your mammogram with the provided scholarship.

## 2024-05-01 ENCOUNTER — Other Ambulatory Visit: Payer: Self-pay

## 2024-05-01 ENCOUNTER — Other Ambulatory Visit: Payer: Self-pay | Admitting: Internal Medicine

## 2024-05-01 LAB — COMPREHENSIVE METABOLIC PANEL WITH GFR
ALT: 140 IU/L — ABNORMAL HIGH (ref 0–32)
AST: 83 IU/L — ABNORMAL HIGH (ref 0–40)
Albumin: 4.8 g/dL (ref 3.8–4.9)
Alkaline Phosphatase: 129 IU/L — ABNORMAL HIGH (ref 44–121)
BUN/Creatinine Ratio: 17 (ref 9–23)
BUN: 13 mg/dL (ref 6–24)
Bilirubin Total: 0.4 mg/dL (ref 0.0–1.2)
CO2: 23 mmol/L (ref 20–29)
Calcium: 9.7 mg/dL (ref 8.7–10.2)
Chloride: 102 mmol/L (ref 96–106)
Creatinine, Ser: 0.77 mg/dL (ref 0.57–1.00)
Globulin, Total: 2.7 g/dL (ref 1.5–4.5)
Glucose: 199 mg/dL — ABNORMAL HIGH (ref 70–99)
Potassium: 4.7 mmol/L (ref 3.5–5.2)
Sodium: 139 mmol/L (ref 134–144)
Total Protein: 7.5 g/dL (ref 6.0–8.5)
eGFR: 92 mL/min/1.73 (ref >59)

## 2024-05-01 LAB — MICROALBUMIN / CREATININE URINE RATIO
Creatinine, Urine: 148 mg/dL
Microalb/Creat Ratio: 13 mg/g{creat} (ref 0–29)
Microalbumin, Urine: 19.1 ug/mL

## 2024-05-01 LAB — LIPID PANEL
Chol/HDL Ratio: 4.2 ratio (ref 0.0–4.4)
Cholesterol, Total: 245 mg/dL — ABNORMAL HIGH (ref 100–199)
HDL: 58 mg/dL (ref >39)
LDL Chol Calc (NIH): 135 mg/dL — ABNORMAL HIGH (ref 0–99)
Triglycerides: 289 mg/dL — ABNORMAL HIGH (ref 0–149)
VLDL Cholesterol Cal: 52 mg/dL — ABNORMAL HIGH (ref 5–40)

## 2024-05-01 MED ORDER — SEMAGLUTIDE(0.25 OR 0.5MG/DOS) 2 MG/3ML ~~LOC~~ SOPN
0.5000 mg | PEN_INJECTOR | SUBCUTANEOUS | 2 refills | Status: DC
  Filled 2024-05-01: qty 3, fill #0
  Filled 2024-06-04 – 2024-06-11 (×2): qty 3, 28d supply, fill #0
  Filled 2024-07-09: qty 3, 28d supply, fill #1
  Filled 2024-08-08: qty 3, 28d supply, fill #2

## 2024-05-03 ENCOUNTER — Ambulatory Visit: Payer: Self-pay | Admitting: Internal Medicine

## 2024-05-03 DIAGNOSIS — E1169 Type 2 diabetes mellitus with other specified complication: Secondary | ICD-10-CM

## 2024-05-06 ENCOUNTER — Encounter: Payer: Self-pay | Admitting: Gastroenterology

## 2024-05-07 ENCOUNTER — Telehealth: Payer: Self-pay

## 2024-05-07 NOTE — Telephone Encounter (Signed)
 Telephoned patient using interpreter, [name]. Patient requested mammogram scholarship application. Confirmed patient's address. [facility]

## 2024-05-14 ENCOUNTER — Ambulatory Visit: Payer: Self-pay | Attending: Internal Medicine

## 2024-05-14 DIAGNOSIS — E1169 Type 2 diabetes mellitus with other specified complication: Secondary | ICD-10-CM

## 2024-05-15 ENCOUNTER — Ambulatory Visit: Payer: Self-pay | Admitting: Family Medicine

## 2024-05-15 LAB — CBC
Hematocrit: 42.3 % (ref 34.0–46.6)
Hemoglobin: 13 g/dL (ref 11.1–15.9)
MCH: 28.2 pg (ref 26.6–33.0)
MCHC: 30.7 g/dL — ABNORMAL LOW (ref 31.5–35.7)
MCV: 92 fL (ref 79–97)
Platelets: 229 x10E3/uL (ref 150–450)
RBC: 4.61 x10E6/uL (ref 3.77–5.28)
RDW: 13.8 % (ref 11.7–15.4)
WBC: 7 x10E3/uL (ref 3.4–10.8)

## 2024-06-04 ENCOUNTER — Other Ambulatory Visit: Payer: Self-pay

## 2024-06-07 ENCOUNTER — Telehealth: Payer: Self-pay | Admitting: Internal Medicine

## 2024-06-07 NOTE — Telephone Encounter (Addendum)
 Let patient know that if she has been tolerating the Ozempic  0.25 mg, then dose will be increased to 0.5 mg once a wk. She can pick up rxn from pharmacy. Start 0.5 mg dose.

## 2024-06-07 NOTE — Telephone Encounter (Signed)
 Patient came into the office inquiring whether her Ozempic  Injection dose has been increased or if it will remain the same. Please advise.

## 2024-06-11 ENCOUNTER — Other Ambulatory Visit: Payer: Self-pay

## 2024-06-11 NOTE — Telephone Encounter (Signed)
 Called but no answer. Unable to LVM

## 2024-06-12 NOTE — Telephone Encounter (Signed)
 Called but no answer. Unable to LVM

## 2024-06-13 NOTE — Telephone Encounter (Signed)
 Called & spoke to the patient. Verified name & DOB. Informed patient that if she has been tolerating the Ozempic  0.25mg , then dose will be increased to 0.5 mg once a week. Patient confirmed that she has been tolerating well. Informed that prescription is available at her pharmacy to start at the 0.5 mg dose.

## 2024-07-09 ENCOUNTER — Other Ambulatory Visit: Payer: Self-pay

## 2024-08-08 ENCOUNTER — Other Ambulatory Visit: Payer: Self-pay

## 2024-08-08 ENCOUNTER — Other Ambulatory Visit (HOSPITAL_COMMUNITY): Payer: Self-pay

## 2024-08-12 ENCOUNTER — Other Ambulatory Visit: Payer: Self-pay

## 2024-09-06 ENCOUNTER — Other Ambulatory Visit: Payer: Self-pay

## 2024-09-06 ENCOUNTER — Other Ambulatory Visit: Payer: Self-pay | Admitting: Internal Medicine

## 2024-09-08 MED ORDER — OZEMPIC (0.25 OR 0.5 MG/DOSE) 2 MG/3ML ~~LOC~~ SOPN
0.5000 mg | PEN_INJECTOR | SUBCUTANEOUS | 0 refills | Status: DC
  Filled 2024-09-08: qty 3, 28d supply, fill #0

## 2024-09-09 ENCOUNTER — Other Ambulatory Visit: Payer: Self-pay

## 2024-09-12 ENCOUNTER — Other Ambulatory Visit: Payer: Self-pay

## 2024-10-14 ENCOUNTER — Other Ambulatory Visit: Payer: Self-pay

## 2024-10-14 ENCOUNTER — Ambulatory Visit: Payer: Self-pay | Attending: Internal Medicine | Admitting: Internal Medicine

## 2024-10-14 ENCOUNTER — Encounter: Payer: Self-pay | Admitting: Internal Medicine

## 2024-10-14 ENCOUNTER — Ambulatory Visit: Payer: Self-pay | Admitting: Internal Medicine

## 2024-10-14 ENCOUNTER — Other Ambulatory Visit: Payer: Self-pay | Admitting: Internal Medicine

## 2024-10-14 VITALS — BP 128/81 | HR 76 | Temp 98.1°F | Ht 64.0 in | Wt 183.0 lb

## 2024-10-14 DIAGNOSIS — E119 Type 2 diabetes mellitus without complications: Secondary | ICD-10-CM

## 2024-10-14 DIAGNOSIS — Z1211 Encounter for screening for malignant neoplasm of colon: Secondary | ICD-10-CM

## 2024-10-14 DIAGNOSIS — E785 Hyperlipidemia, unspecified: Secondary | ICD-10-CM

## 2024-10-14 DIAGNOSIS — Z1231 Encounter for screening mammogram for malignant neoplasm of breast: Secondary | ICD-10-CM

## 2024-10-14 DIAGNOSIS — Z7985 Long-term (current) use of injectable non-insulin antidiabetic drugs: Secondary | ICD-10-CM

## 2024-10-14 DIAGNOSIS — R030 Elevated blood-pressure reading, without diagnosis of hypertension: Secondary | ICD-10-CM

## 2024-10-14 DIAGNOSIS — E1169 Type 2 diabetes mellitus with other specified complication: Secondary | ICD-10-CM

## 2024-10-14 DIAGNOSIS — Z23 Encounter for immunization: Secondary | ICD-10-CM

## 2024-10-14 DIAGNOSIS — Z7984 Long term (current) use of oral hypoglycemic drugs: Secondary | ICD-10-CM

## 2024-10-14 DIAGNOSIS — K760 Fatty (change of) liver, not elsewhere classified: Secondary | ICD-10-CM

## 2024-10-14 LAB — POCT GLYCOSYLATED HEMOGLOBIN (HGB A1C): HbA1c, POC (controlled diabetic range): 8.5 % — AB (ref 0.0–7.0)

## 2024-10-14 LAB — GLUCOSE, POCT (MANUAL RESULT ENTRY): POC Glucose: 263 mg/dL — AB (ref 70–99)

## 2024-10-14 MED ORDER — METFORMIN HCL 1000 MG PO TABS
ORAL_TABLET | ORAL | 1 refills | Status: AC
  Filled 2024-10-14: qty 180, 90d supply, fill #0

## 2024-10-14 MED ORDER — SEMAGLUTIDE (1 MG/DOSE) 4 MG/3ML ~~LOC~~ SOPN
1.0000 mg | PEN_INJECTOR | SUBCUTANEOUS | 6 refills | Status: AC
  Filled 2024-10-14: qty 3, 28d supply, fill #0

## 2024-10-14 NOTE — Progress Notes (Signed)
 "   Patient ID: Linda Delgado, female    DOB: [birth year]  MRN: [id number]  CC: Diabetes (DM f/u. Med refills/No questions / concerns/Yes to mammogram. Flu vax administered on 10/16/23 - C.A.)   Subjective: Linda Delgado is a 54 y.o. female who presents for chronic ds management. Husband is with her. Her concerns today include:  Pt with hx of NAFLD, obesity, DM, HL, anxiety.   AMN Language interpreter used during this encounter. #[id number]    Discussed the use of AI scribe software for clinical note transcription with the patient, who gave verbal consent to proceed.  History of Present Illness Linda Delgado is a 54 year old female with diabetes and hyperlipidemia who presents for follow-up of her chronic conditions. She is accompanied by her husband.  DM: Results for orders placed or performed in visit on 10/14/24  POCT glucose (manual entry)   Collection Time: 10/14/24  1:47 PM  Result Value Ref Range   POC Glucose 263 (A) 70 - 99 mg/dl  POCT glycosylated hemoglobin (Hb A1C)   Collection Time: 10/14/24  1:51 PM  Result Value Ref Range   Hemoglobin A1C     HbA1c POC (<> result, manual entry)     HbA1c, POC (prediabetic range)     HbA1c, POC (controlled diabetic range) 8.5 (A) 0.0 - 7.0 %   She continues to take metformin  1000 mg twice daily and Ozempic  0.5 mg once a week. Reports no significant S.E with Ozempic . She checks her blood sugar two to three times a week, with readings between 140 and 160 mg/dL, typically in the mornings before breakfast. Ozempic  helps decrease her appetite, and she is trying to eat less flour-based foods like bread and tortillas. She drinks water but occasionally consumes soda. She has not been exercising for the past three months due to feeling low on energy. CBC on last visit was normal. She has hyperlipidemia with previously elevated LDL cholesterol at 135 mg/dL. She is not currently on cholesterol-lowering  medication. Has hx of NAFLD with elev LFTs.   HM: Referred for mammogram on last visit.  Patient reports she was called.  She tried to call them back but no one would ever answer.  Agreeable to receiving flu vaccine today.  Due for colon cancer screening.  She had done FIT tests in the past.    Patient Active Problem List   Diagnosis Date Noted   History of Helicobacter pylori infection 10/25/2023   LLQ abdominal pain 10/25/2023   Elevated LFTs 10/25/2023   Colon cancer screening 10/25/2023   Adhesive capsulitis of left shoulder 07/08/2022   Migraine without aura and without status migrainosus, not intractable 02/08/2022   Generalized anxiety disorder 06/08/2020   Elevated blood pressure reading 02/07/2020   Mixed hyperlipidemia 02/07/2020   Hyperlipidemia associated with type 2 diabetes mellitus (HCC) 06/05/2019   Controlled type 2 diabetes mellitus without complication, without long-term current use of insulin  (HCC) 04/13/2018   Mastalgia in female 02/23/2017   NAFLD (nonalcoholic fatty liver disease) 94/82/7981   Class 1 obesity due to excess calories without serious comorbidity with body mass index (BMI) of 30.0 to 30.9 in adult 02/23/2017   Thyroid activity decreased 11/13/2014   Menorrhagia 04/02/2014     Medications Ordered Prior to Encounter[1]  Allergies[2]  Social History   Socioeconomic History   Marital status: Married    Spouse name: Not on file   Number of children: 3   Years of education: Not on file  Highest education level: Bachelor's degree (e.g., BA, AB, BS)  Occupational History   Occupation: house wife  Tobacco Use   Smoking status: Never   Smokeless tobacco: Never  Vaping Use   Vaping status: Never Used  Substance and Sexual Activity   Alcohol use: Not Currently    Comment: rarely   Drug use: No   Sexual activity: Yes    Partners: Male    Birth control/protection: None  Other Topics Concern   Not on file  Social History Narrative   Patient  doesn't like to talk on the phone   Social Drivers of Health   Tobacco Use: Low Risk (04/29/2024)   Patient History    Smoking Tobacco Use: Never    Smokeless Tobacco Use: Never    Passive Exposure: Not on file  Financial Resource Strain: Medium Risk (09/12/2023)   Overall Financial Resource Strain (CARDIA)    Difficulty of Paying Living Expenses: Somewhat hard  Food Insecurity: No Food Insecurity (09/12/2023)   Hunger Vital Sign    Worried About Running Out of Food in the Last Year: Never true    Ran Out of Food in the Last Year: Never true  Transportation Needs: No Transportation Needs (09/12/2023)   PRAPARE - Administrator, Civil Service (Medical): No    Lack of Transportation (Non-Medical): No  Physical Activity: Not on file  Stress: No Stress Concern Present (09/12/2023)   [employer] of Occupational Health - Occupational Stress Questionnaire    Feeling of Stress : Not at all  Social Connections: Socially Integrated (09/12/2023)   Social Connection and Isolation Panel    Frequency of Communication with Friends and Family: Three times a week    Frequency of Social Gatherings with Friends and Family: Twice a week    Attends Religious Services: 1 to 4 times per year    Active Member of Clubs or Organizations: Yes    Attends Engineer, Structural: More than 4 times per year    Marital Status: Married  Catering Manager Violence: Not on file  Depression (PHQ2-9): Low Risk (04/29/2024)   Depression (PHQ2-9)    PHQ-2 Score: 1  Alcohol Screen: Not on file  Housing: Low Risk (09/12/2023)   Housing    Last Housing Risk Score: 0  Utilities: Not At Risk (09/12/2023)   AHC Utilities    Threatened with loss of utilities: No  Health Literacy: Adequate Health Literacy (09/12/2023)   B1300 Health Literacy    Frequency of need for help with medical instructions: Never    Family History  Problem Relation Age of Onset   Hypertension Mother    Diabetes Father     Hypertension Father    Stroke Father    Cancer Paternal Grandmother        stomach   Breast cancer Paternal Aunt    Colon cancer Neg Hx    Stomach cancer Neg Hx    Esophageal cancer Neg Hx     Past Surgical History:  Procedure Laterality Date   CESAREAN SECTION  1997, 1999, 2007   WISDOM TOOTH EXTRACTION      ROS: Review of Systems Negative except as stated above  PHYSICAL EXAM: BP 128/81   Pulse 76   Temp 98.1 F (36.7 C) (Oral)   Ht 5' 4 (1.626 m)   Wt 183 lb (83 kg)   SpO2 99%   BMI 31.41 kg/m   Wt Readings from Last 3 Encounters:  10/14/24 183 lb (83 kg)  04/29/24 182 lb 3.2 oz (82.6 kg)  10/25/23 183 lb (83 kg)    Physical Exam  General appearance - alert, well appearing, middle age to older Hispanic female and in no distress Mental status - normal mood, behavior, speech, dress, motor activity, and thought processes Neck - supple, no significant adenopathy Chest - clear to auscultation, no wheezes, rales or rhonchi, symmetric air entry Heart - normal rate, regular rhythm, normal S1, S2, no murmurs, rubs, clicks or gallops Extremities - peripheral pulses normal, no pedal edema, no clubbing or cyanosis   Fibrosis 4 Score = 1.65 (Indeterminate)       Interpretation for patients with NAFLD          <1.30       -  F0-F1 (Low risk)          1.30-2.67 -  Indeterminate           >2.67      -  F3-F4 (High risk)     Validated for ages 42-65           Latest Ref Rng & Units 04/29/2024   11:54 AM 10/25/2023   11:34 AM 09/12/2023   10:39 AM  CMP  Glucose 70 - 99 mg/dL 800     BUN 6 - 24 mg/dL 13     Creatinine 9.42 - 1.00 mg/dL 9.22     Sodium 865 - 855 mmol/L 139     Potassium 3.5 - 5.2 mmol/L 4.7     Chloride 96 - 106 mmol/L 102     CO2 20 - 29 mmol/L 23     Calcium  8.7 - 10.2 mg/dL 9.7     Total Protein 6.0 - 8.5 g/dL 7.5  7.9  7.4   Total Bilirubin 0.0 - 1.2 mg/dL 0.4  0.5  0.3   Alkaline Phos 44 - 121 IU/L 129  89  122   AST 0 - 40 IU/L 83  67  77    ALT 0 - 32 IU/L 140  108  121    Lipid Panel     Component Value Date/Time   CHOL 245 (H) 04/29/2024 1154   TRIG 289 (H) 04/29/2024 1154   HDL 58 04/29/2024 1154   CHOLHDL 4.2 04/29/2024 1154   CHOLHDL 3.8 02/24/2014 0900   VLDL 33 02/24/2014 0900   LDLCALC 135 (H) 04/29/2024 1154    CBC    Component Value Date/Time   WBC 7.0 05/14/2024 0958   WBC 6.0 11/13/2014 1137   RBC 4.61 05/14/2024 0958   RBC 4.23 11/13/2014 1137   HGB 13.0 05/14/2024 0958   HCT 42.3 05/14/2024 0958   PLT 229 05/14/2024 0958   MCV 92 05/14/2024 0958   MCH 28.2 05/14/2024 0958   MCH 28.6 11/13/2014 1137   MCHC 30.7 (L) 05/14/2024 0958   MCHC 34.0 11/13/2014 1137   RDW 13.8 05/14/2024 0958   LYMPHSABS 2.5 11/13/2014 1137   MONOABS 0.4 11/13/2014 1137   EOSABS 0.2 11/13/2014 1137   BASOSABS 0.0 11/13/2014 1137    ASSESSMENT AND PLAN: 1. Type 2 diabetes mellitus with other specified complication, without long-term current use of insulin  (HCC) (Primary) Not at goal despite compliance with metformin  and Ozempic . Commended her on changes made in her eating habits. Encouraged her to try to get in some exercise about 3 days a week for 30 minutes. Continue metformin  1 g twice a day. She is agreeable to increasing Ozempic  to 1 mg once a week.  [employer]  over potential side effects of the medication.  Stop the medicine and come in if she develops any vomiting, abdominal pain, severe diarrhea, palpitations. - POCT glucose (manual entry) - POCT glycosylated hemoglobin (Hb A1C) - metFORMIN  (GLUCOPHAGE ) 1000 MG tablet; Take 1 tablet (1,000 mg total) by mouth every morning AND 1 tablet (1,000 mg total) every evening.  Dispense: 180 tablet; Refill: 1 - Semaglutide , 1 MG/DOSE, 4 MG/3ML SOPN; Inject 1 mg as directed once a week.  Dispense: 3 mL; Refill: 6  2. Diabetes mellitus treated with oral medication (HCC) 3. Long-term (current) use of injectable non-insulin  antidiabetic drugs See #1 above.  4. Hyperlipidemia  associated with type 2 diabetes mellitus (HCC) Not at goal.  We will recheck these today.  Will make decision at that time of whether to try her with a low-dose of statin therapy with close monitoring of LFTs.  5. Metabolic dysfunction-associated steatotic liver disease (MASLD) Discussed and encouraged healthy eating habits. Weight loss will help. - Hepatic Function Panel  6. Elevated blood pressure reading in office without diagnosis of hypertension Blood pressure elevated today but repeat blood pressure improved to 128/81.  DASH diet discussed and encouraged.  7. Need for immunization against influenza - Flu vaccine trivalent PF, 6mos and older(Flulaval,Afluria,Fluarix,Fluzone)  8. Encounter for screening mammogram for malignant neoplasm of breast - MM 3D SCREENING MAMMOGRAM BILATERAL BREAST; Future  9. Screening for colon cancer - Fecal occult blood, imunochemical([facility])   Patient was given the opportunity to ask questions.  Patient verbalized understanding of the plan and was able to repeat key elements of the plan.   This documentation was completed using Paediatric nurse.  Any transcriptional errors are unintentional.  Orders Placed This Encounter  Procedures   Fecal occult blood, imunochemical([facility])   MM 3D SCREENING MAMMOGRAM BILATERAL BREAST   Flu vaccine trivalent PF, 6mos and older(Flulaval,Afluria,Fluarix,Fluzone)   Hepatic Function Panel   POCT glucose (manual entry)   POCT glycosylated hemoglobin (Hb A1C)     Requested Prescriptions   Signed Prescriptions Disp Refills   metFORMIN  (GLUCOPHAGE ) 1000 MG tablet 180 tablet 1    Sig: Take 1 tablet (1,000 mg total) by mouth every morning AND 1 tablet (1,000 mg total) every evening.   Semaglutide , 1 MG/DOSE, 4 MG/3ML SOPN 3 mL 6    Sig: Inject 1 mg as directed once a week.    Return in about 4 months (around 02/11/2025).  Barnie Louder, MD, FACP     [1]  Current  Outpatient Medications on File Prior to Visit  Medication Sig Dispense Refill   Blood Glucose Monitoring Suppl (TRUE METRIX METER) w/Device KIT 1 each by Does not apply route 3 (three) times daily. 1 kit 0   glucose blood (TRUE METRIX BLOOD GLUCOSE TEST) test strip USE AS INSTRUCTED 100 strip 6   Multiple Vitamin (MULTIVITAMIN) tablet Take 1 tablet by mouth daily.     TRUEplus Lancets 28G MISC USE AS DIRECTED 100 each 6   No current facility-administered medications on file prior to visit.  [2]  Allergies Allergen Reactions   Atorvastatin  Other (See Comments)    Muscle pain   "

## 2024-10-14 NOTE — Patient Instructions (Signed)
" °  VISIT SUMMARY: Today, we discussed the management of your diabetes, hyperlipidemia, and liver health. We reviewed your current medications, blood sugar levels, and cholesterol levels. We also talked about the importance of regular exercise, dietary changes, and routine health screenings.  YOUR PLAN: -TYPE 2 DIABETES MELLITUS: Type 2 diabetes is a condition where your body does not use insulin  properly, leading to high blood sugar levels. Your A1c is currently 8.5%, which indicates suboptimal control. We have increased your Ozempic  dose to 1 mg weekly and sent the prescription to the pharmacy. Please be aware of potential side effects with the increased dose. Continue taking metformin  as prescribed, check your blood sugar regularly, and try to exercise regularly. Avoid sugary drinks and reduce flour-based foods in your diet. An annual eye exam is recommended to monitor for diabetes-related eye issues.  -HYPERLIPIDEMIA ASSOCIATED WITH TYPE 2 DIABETES MELLITUS: Hyperlipidemia means you have high levels of cholesterol in your blood, which can increase your risk of heart disease. Your LDL cholesterol is currently 135 mg/dL. We have rechecked your liver function tests and will consider starting you on a low-dose cholesterol medication based on the results. Continue to follow a healthy diet and exercise regularly.  -METABOLIC DYSFUNCTION-ASSOCIATED STEATOTIC LIVER DISEASE (MASLD): MASLD is a condition where fat builds up in your liver, which can lead to inflammation and liver damage. Weight loss can help improve this condition. We have rechecked your liver function tests and encourage you to lose weight to reduce liver inflammation.  -GENERAL HEALTH MAINTENANCE: You are overdue for an eye exam, mammogram, and colon cancer screening. Your blood pressure was slightly elevated initially but improved upon recheck. We recommend scheduling an annual eye exam and rescheduling your mammogram. We provided you with a  stool kit for colon cancer screening; please return it the same day you use it. Reducing salt intake can help manage your blood pressure.  INSTRUCTIONS: Please follow up with the pharmacy to ensure you receive your Ozempic  prescription. Schedule your annual eye exam and reschedule your mammogram. Use the provided stool kit for colon cancer screening and return it the same day. Continue to monitor your blood sugar levels, follow the dietary recommendations, and try to incorporate regular exercise into your routine. We will review your liver function test results and discuss the possibility of starting cholesterol medication at your next visit.                      Contains text generated by Abridge.                                 Contains text generated by Abridge.   "

## 2024-10-15 LAB — HEPATIC FUNCTION PANEL
ALT: 82 IU/L — ABNORMAL HIGH (ref 0–32)
AST: 53 IU/L — ABNORMAL HIGH (ref 0–40)
Albumin: 4.5 g/dL (ref 3.8–4.9)
Alkaline Phosphatase: 139 IU/L — ABNORMAL HIGH (ref 49–135)
Bilirubin Total: 0.3 mg/dL (ref 0.0–1.2)
Bilirubin, Direct: 0.11 mg/dL (ref 0.00–0.40)
Total Protein: 7.4 g/dL (ref 6.0–8.5)

## 2024-10-16 NOTE — Addendum Note (Signed)
 Addended by: VICCI SOBER B on: 10/16/2024 10:06 AM   Modules accepted: Orders

## 2024-10-17 LAB — FECAL OCCULT BLOOD, IMMUNOCHEMICAL: Fecal Occult Bld: NEGATIVE

## 2024-10-18 LAB — LIPID PANEL
Chol/HDL Ratio: 4.3 ratio (ref 0.0–4.4)
Cholesterol, Total: 234 mg/dL — ABNORMAL HIGH (ref 100–199)
HDL: 54 mg/dL
LDL Chol Calc (NIH): 120 mg/dL — ABNORMAL HIGH (ref 0–99)
Triglycerides: 342 mg/dL — ABNORMAL HIGH (ref 0–149)
VLDL Cholesterol Cal: 60 mg/dL — ABNORMAL HIGH (ref 5–40)

## 2024-10-18 LAB — SPECIMEN STATUS REPORT

## 2024-10-19 ENCOUNTER — Ambulatory Visit: Payer: Self-pay | Admitting: Internal Medicine

## 2024-10-19 MED ORDER — PRAVASTATIN SODIUM 10 MG PO TABS
10.0000 mg | ORAL_TABLET | ORAL | 1 refills | Status: AC
  Filled 2024-10-19: qty 12, 28d supply, fill #0

## 2024-10-19 NOTE — Progress Notes (Signed)
 Stool test for colon cancer screening was negative.  Due again in 1 year. Cholesterol levels improved slightly from 6 months ago but still elevated.  High cholesterol increases her risk for heart attack and strokes. Liver enzymes still mildly elevated but improved compared to 6 months ago.  I would like to try her with the cholesterol-lowering medicine called  Pravachol  10 mg taking it just 3 days a week on Monday Wednesday and Fridays.  After being on it for 1 month, please return to the lab for us  to recheck the liver function test. Let us  know if she develops any muscle cramps/pain as she did with Atorvastatin . If she is willing and agreeable to doing so, I have sent the prescription to the pharmacy for pickup. Please give lab appt in 1 mth if she is willing to take the cholesterol medication.

## 2024-10-21 ENCOUNTER — Other Ambulatory Visit: Payer: Self-pay

## 2024-10-22 ENCOUNTER — Other Ambulatory Visit: Payer: Self-pay | Admitting: Obstetrics and Gynecology

## 2024-10-22 DIAGNOSIS — Z1231 Encounter for screening mammogram for malignant neoplasm of breast: Secondary | ICD-10-CM

## 2024-10-23 ENCOUNTER — Other Ambulatory Visit: Payer: Self-pay | Admitting: Internal Medicine

## 2024-10-23 DIAGNOSIS — K760 Fatty (change of) liver, not elsewhere classified: Secondary | ICD-10-CM

## 2024-10-25 ENCOUNTER — Other Ambulatory Visit: Payer: Self-pay

## 2024-10-29 ENCOUNTER — Other Ambulatory Visit: Payer: Self-pay

## 2024-10-31 ENCOUNTER — Other Ambulatory Visit: Payer: Self-pay

## 2024-11-11 ENCOUNTER — Other Ambulatory Visit: Payer: Self-pay

## 2024-11-27 ENCOUNTER — Ambulatory Visit: Payer: Self-pay

## 2024-11-28 ENCOUNTER — Ambulatory Visit: Payer: Self-pay | Admitting: Internal Medicine

## 2024-12-19 ENCOUNTER — Ambulatory Visit: Payer: Self-pay

## 2025-02-14 ENCOUNTER — Ambulatory Visit: Payer: Self-pay | Admitting: Internal Medicine
